# Patient Record
Sex: Male | Born: 1965 | Race: White | Hispanic: No | Marital: Married | Smoking: Never smoker
Health system: Southern US, Community
[De-identification: ages and names within clinical notes are randomized; demographics above are authoritative.]

## PROBLEM LIST (undated history)

## (undated) DIAGNOSIS — G4730 Sleep apnea, unspecified: Secondary | ICD-10-CM

## (undated) DIAGNOSIS — I219 Acute myocardial infarction, unspecified: Secondary | ICD-10-CM

## (undated) DIAGNOSIS — Z955 Presence of coronary angioplasty implant and graft: Secondary | ICD-10-CM

## (undated) DIAGNOSIS — J45909 Unspecified asthma, uncomplicated: Secondary | ICD-10-CM

## (undated) DIAGNOSIS — K219 Gastro-esophageal reflux disease without esophagitis: Secondary | ICD-10-CM

## (undated) DIAGNOSIS — R911 Solitary pulmonary nodule: Secondary | ICD-10-CM

## (undated) DIAGNOSIS — E785 Hyperlipidemia, unspecified: Secondary | ICD-10-CM

## (undated) DIAGNOSIS — R0600 Dyspnea, unspecified: Secondary | ICD-10-CM

## (undated) DIAGNOSIS — I2510 Atherosclerotic heart disease of native coronary artery without angina pectoris: Secondary | ICD-10-CM

## (undated) DIAGNOSIS — E109 Type 1 diabetes mellitus without complications: Secondary | ICD-10-CM

## (undated) DIAGNOSIS — I10 Essential (primary) hypertension: Secondary | ICD-10-CM

## (undated) DIAGNOSIS — E039 Hypothyroidism, unspecified: Secondary | ICD-10-CM

## (undated) DIAGNOSIS — Z8489 Family history of other specified conditions: Secondary | ICD-10-CM

## (undated) HISTORY — PX: POLYPECTOMY: SHX149

## (undated) HISTORY — PX: TONSILLECTOMY AND ADENOIDECTOMY: SUR1326

## (undated) HISTORY — DX: Sleep apnea, unspecified: G47.30

## (undated) HISTORY — PX: CHOLECYSTECTOMY: SHX55

## (undated) HISTORY — PX: COLONOSCOPY: SHX174

## (undated) HISTORY — DX: Unspecified asthma, uncomplicated: J45.909

## (undated) HISTORY — DX: Presence of coronary angioplasty implant and graft: Z95.5

## (undated) HISTORY — DX: Gastro-esophageal reflux disease without esophagitis: K21.9

## (undated) HISTORY — PX: KNEE ARTHROSCOPY: SHX127

## (undated) HISTORY — PX: APPENDECTOMY: SHX54

---

## 1998-10-01 ENCOUNTER — Other Ambulatory Visit: Admission: RE | Admit: 1998-10-01 | Discharge: 1998-10-01 | Payer: Self-pay | Admitting: *Deleted

## 2003-05-26 ENCOUNTER — Encounter: Payer: Self-pay | Admitting: Orthopedic Surgery

## 2003-05-26 ENCOUNTER — Ambulatory Visit (HOSPITAL_COMMUNITY): Admission: RE | Admit: 2003-05-26 | Discharge: 2003-05-26 | Payer: Self-pay | Admitting: Orthopedic Surgery

## 2011-06-09 ENCOUNTER — Encounter (INDEPENDENT_AMBULATORY_CARE_PROVIDER_SITE_OTHER): Payer: Self-pay | Admitting: Ophthalmology

## 2011-06-12 ENCOUNTER — Encounter (INDEPENDENT_AMBULATORY_CARE_PROVIDER_SITE_OTHER): Payer: PRIVATE HEALTH INSURANCE | Admitting: Ophthalmology

## 2011-06-12 DIAGNOSIS — H4310 Vitreous hemorrhage, unspecified eye: Secondary | ICD-10-CM

## 2011-06-12 DIAGNOSIS — E11359 Type 2 diabetes mellitus with proliferative diabetic retinopathy without macular edema: Secondary | ICD-10-CM

## 2011-06-12 DIAGNOSIS — H43819 Vitreous degeneration, unspecified eye: Secondary | ICD-10-CM

## 2011-06-12 DIAGNOSIS — H2510 Age-related nuclear cataract, unspecified eye: Secondary | ICD-10-CM

## 2011-07-04 ENCOUNTER — Ambulatory Visit (INDEPENDENT_AMBULATORY_CARE_PROVIDER_SITE_OTHER): Payer: Self-pay | Admitting: Ophthalmology

## 2011-10-24 DIAGNOSIS — R911 Solitary pulmonary nodule: Secondary | ICD-10-CM

## 2011-10-24 DIAGNOSIS — I219 Acute myocardial infarction, unspecified: Secondary | ICD-10-CM

## 2011-10-24 HISTORY — DX: Solitary pulmonary nodule: R91.1

## 2011-10-24 HISTORY — DX: Acute myocardial infarction, unspecified: I21.9

## 2011-12-13 ENCOUNTER — Ambulatory Visit (INDEPENDENT_AMBULATORY_CARE_PROVIDER_SITE_OTHER): Payer: BC Managed Care – PPO | Admitting: Ophthalmology

## 2011-12-13 DIAGNOSIS — E1039 Type 1 diabetes mellitus with other diabetic ophthalmic complication: Secondary | ICD-10-CM

## 2011-12-13 DIAGNOSIS — H4310 Vitreous hemorrhage, unspecified eye: Secondary | ICD-10-CM

## 2011-12-13 DIAGNOSIS — H2510 Age-related nuclear cataract, unspecified eye: Secondary | ICD-10-CM

## 2011-12-13 DIAGNOSIS — H43819 Vitreous degeneration, unspecified eye: Secondary | ICD-10-CM

## 2011-12-13 DIAGNOSIS — E11359 Type 2 diabetes mellitus with proliferative diabetic retinopathy without macular edema: Secondary | ICD-10-CM

## 2012-03-28 ENCOUNTER — Encounter (INDEPENDENT_AMBULATORY_CARE_PROVIDER_SITE_OTHER): Payer: BC Managed Care – PPO | Admitting: Ophthalmology

## 2012-03-28 DIAGNOSIS — H43819 Vitreous degeneration, unspecified eye: Secondary | ICD-10-CM

## 2012-03-28 DIAGNOSIS — E11359 Type 2 diabetes mellitus with proliferative diabetic retinopathy without macular edema: Secondary | ICD-10-CM

## 2012-03-28 DIAGNOSIS — H4310 Vitreous hemorrhage, unspecified eye: Secondary | ICD-10-CM | POA: Insufficient documentation

## 2012-03-28 DIAGNOSIS — E1039 Type 1 diabetes mellitus with other diabetic ophthalmic complication: Secondary | ICD-10-CM

## 2012-03-28 DIAGNOSIS — H2510 Age-related nuclear cataract, unspecified eye: Secondary | ICD-10-CM

## 2012-03-28 DIAGNOSIS — H3340 Traction detachment of retina, unspecified eye: Secondary | ICD-10-CM

## 2012-03-28 DIAGNOSIS — E113599 Type 2 diabetes mellitus with proliferative diabetic retinopathy without macular edema, unspecified eye: Secondary | ICD-10-CM | POA: Insufficient documentation

## 2012-03-28 NOTE — H&P (Signed)
[name] is an 46 y.o. male.   Chief Complaint:floaters and shadows in vision HPI: Longstanding diabetic with vitreous hemorrhage and traction right eye  No past medical history on file.  No past surgical history on file.  No family history on file. Social History:  does not have a smoking history on file. He does not have any smokeless tobacco history on file. His alcohol and drug histories not on file.  Allergies: Allergies not on file   (Not in a hospital admission)  Review of systems otherwise negative  There were no vitals taken for this visit.  Physical exam: Mental status: oriented x3. Eyes: See eye exam associated with this date of surgery in media tab.  Scanned in by scanning center Ears, Nose, Throat: within normal limits Neck: Within Normal limits General: within normal limits Chest: Within normal limits Breast: deferred Heart: Within normal limits Abdomen: Within normal limits GU: deferred Extremities: within normal limits Skin: within normal limits  Assessment/Plan Diabetic retinopathy with vitreous hemorrhage Plan: To [facility] for Pars plana vitrectomy, laser therapy and gas injection right eye  Sherrie George 03/28/2012, 5:11 PM

## 2012-04-29 MED ORDER — GATIFLOXACIN 0.5 % OP SOLN
1.0000 [drp] | OPHTHALMIC | Status: DC | PRN
  Filled 2012-04-29: qty 2.5

## 2012-04-29 MED ORDER — CYCLOPENTOLATE HCL 1 % OP SOLN
1.0000 [drp] | OPHTHALMIC | Status: DC | PRN
  Filled 2012-04-29: qty 2

## 2012-04-29 MED ORDER — PHENYLEPHRINE HCL 2.5 % OP SOLN
1.0000 [drp] | OPHTHALMIC | Status: DC | PRN
  Filled 2012-04-29: qty 3

## 2012-04-29 MED ORDER — CEFAZOLIN SODIUM 1-5 GM-% IV SOLN: 1.0000 g | INTRAVENOUS | Status: DC

## 2012-04-29 MED ORDER — TROPICAMIDE 1 % OP SOLN
1.0000 [drp] | OPHTHALMIC | Status: DC | PRN
  Filled 2012-04-29: qty 3

## 2012-04-30 ENCOUNTER — Ambulatory Visit (HOSPITAL_COMMUNITY): Payer: BC Managed Care – PPO

## 2012-04-30 ENCOUNTER — Encounter (HOSPITAL_COMMUNITY): Payer: Self-pay | Admitting: Vascular Surgery

## 2012-04-30 ENCOUNTER — Encounter (HOSPITAL_COMMUNITY)
Admission: RE | Disposition: A | Discharge status: Home / Self Care | Payer: Self-pay | Source: Ambulatory Visit | Attending: Ophthalmology

## 2012-04-30 ENCOUNTER — Encounter (HOSPITAL_COMMUNITY): Payer: Self-pay | Admitting: *Deleted

## 2012-04-30 ENCOUNTER — Ambulatory Visit (INDEPENDENT_AMBULATORY_CARE_PROVIDER_SITE_OTHER): Payer: BC Managed Care – PPO | Admitting: Ophthalmology

## 2012-04-30 ENCOUNTER — Ambulatory Visit (HOSPITAL_COMMUNITY)
Admission: RE | Admit: 2012-04-30 | Discharge: 2012-04-30 | Disposition: A | Discharge status: Home / Self Care | Payer: BC Managed Care – PPO | Source: Ambulatory Visit | Attending: Ophthalmology | Admitting: Ophthalmology

## 2012-04-30 DIAGNOSIS — Z01810 Encounter for preprocedural cardiovascular examination: Secondary | ICD-10-CM | POA: Insufficient documentation

## 2012-04-30 DIAGNOSIS — E11359 Type 2 diabetes mellitus with proliferative diabetic retinopathy without macular edema: Secondary | ICD-10-CM

## 2012-04-30 DIAGNOSIS — H4310 Vitreous hemorrhage, unspecified eye: Secondary | ICD-10-CM

## 2012-04-30 DIAGNOSIS — E1139 Type 2 diabetes mellitus with other diabetic ophthalmic complication: Secondary | ICD-10-CM | POA: Insufficient documentation

## 2012-04-30 DIAGNOSIS — E1039 Type 1 diabetes mellitus with other diabetic ophthalmic complication: Secondary | ICD-10-CM

## 2012-04-30 DIAGNOSIS — E1065 Type 1 diabetes mellitus with hyperglycemia: Secondary | ICD-10-CM

## 2012-04-30 DIAGNOSIS — H2510 Age-related nuclear cataract, unspecified eye: Secondary | ICD-10-CM

## 2012-04-30 DIAGNOSIS — Z01812 Encounter for preprocedural laboratory examination: Secondary | ICD-10-CM | POA: Insufficient documentation

## 2012-04-30 DIAGNOSIS — Z01818 Encounter for other preprocedural examination: Secondary | ICD-10-CM | POA: Insufficient documentation

## 2012-04-30 DIAGNOSIS — H43819 Vitreous degeneration, unspecified eye: Secondary | ICD-10-CM

## 2012-04-30 DIAGNOSIS — Z538 Procedure and treatment not carried out for other reasons: Secondary | ICD-10-CM | POA: Insufficient documentation

## 2012-04-30 HISTORY — DX: Essential (primary) hypertension: I10

## 2012-04-30 LAB — CBC
HCT: 42.7 % (ref 39.0–52.0)
Hemoglobin: 14.6 g/dL (ref 13.0–17.0)
RBC: 4.67 MIL/uL (ref 4.22–5.81)
WBC: 6.7 10*3/uL (ref 4.0–10.5)

## 2012-04-30 LAB — BASIC METABOLIC PANEL
Chloride: 103 mEq/L (ref 96–112)
GFR calc Af Amer: >90 mL/min (ref >90)
Potassium: 4 mEq/L (ref 3.5–5.1)

## 2012-04-30 LAB — SURGICAL PCR SCREEN
MRSA, PCR: POSITIVE — AB
Staphylococcus aureus: POSITIVE — AB

## 2012-04-30 SURGERY — PARS PLANA VITRECTOMY WITH 25 GAUGE
Anesthesia: General | Laterality: Right

## 2012-04-30 MED ORDER — MUPIROCIN 2 % EX OINT
TOPICAL_OINTMENT | CUTANEOUS | Status: AC
  Administered 2012-04-30: 1 via NASAL
  Filled 2012-04-30: qty 22

## 2012-04-30 MED ORDER — CEFAZOLIN SODIUM-DEXTROSE 2-3 GM-% IV SOLR: 2.0000 g | INTRAVENOUS | Status: DC

## 2012-04-30 MED ORDER — MUPIROCIN 2 % EX OINT
TOPICAL_OINTMENT | Freq: Once | CUTANEOUS | Status: AC
  Administered 2012-04-30: 1 via NASAL

## 2012-04-30 MED ORDER — CEFAZOLIN SODIUM-DEXTROSE 2-3 GM-% IV SOLR
INTRAVENOUS | Status: AC
  Filled 2012-04-30: qty 50

## 2012-04-30 SURGICAL SUPPLY — 60 items
APPLICATOR DR MATTHEWS STRL (MISCELLANEOUS) IMPLANT
BLADE EYE CATARACT 19 1.4 BEAV (BLADE) IMPLANT
BLADE MVR KNIFE 19G (BLADE) IMPLANT
BLADE MVR KNIFE 20G (BLADE) IMPLANT
CANNULA DUAL BORE 23G (CANNULA) IMPLANT
CANNULA FLEX TIP 25G (CANNULA) IMPLANT
CLOTH BEACON ORANGE TIMEOUT ST (SAFETY) ×2 IMPLANT
CORDS BIPOLAR (ELECTRODE) IMPLANT
COTTONBALL LRG STERILE PKG (GAUZE/BANDAGES/DRESSINGS) ×6 IMPLANT
DRAPE INCISE 51X51 W/FILM STRL (DRAPES) ×2 IMPLANT
DRAPE OPHTHALMIC 77X100 STRL (CUSTOM PROCEDURE TRAY) ×2 IMPLANT
FILTER BLUE MILLIPORE (MISCELLANEOUS) IMPLANT
FILTER STRAW FLUID ASPIR (MISCELLANEOUS) IMPLANT
FORCEPS ECKARDT ILM 25G SERR (OPHTHALMIC RELATED) IMPLANT
GLOVE SS BIOGEL STRL SZ 6.5 (GLOVE) ×1 IMPLANT
GLOVE SS BIOGEL STRL SZ 7 (GLOVE) ×1 IMPLANT
GLOVE SUPERSENSE BIOGEL SZ 6.5 (GLOVE) ×1
GLOVE SUPERSENSE BIOGEL SZ 7 (GLOVE) ×1
GLOVE SURG 8.5 LATEX PF (GLOVE) ×2 IMPLANT
GOWN STRL NON-REIN LRG LVL3 (GOWN DISPOSABLE) ×6 IMPLANT
ILLUMINATOR CHOW PICK 25GA (MISCELLANEOUS) ×2 IMPLANT
KIT BASIN OR (CUSTOM PROCEDURE TRAY) ×2 IMPLANT
KIT ROOM TURNOVER OR (KITS) ×2 IMPLANT
KNIFE CRESCENT 2.5 55 ANG (BLADE) IMPLANT
LENS BIOM SUPER VIEW SET DISP (OPHTHALMIC RELATED) IMPLANT
MARKER SKIN DUAL TIP RULER LAB (MISCELLANEOUS) IMPLANT
MICROPICK 25G (MISCELLANEOUS)
NEEDLE 18GX1X1/2 (RX/OR ONLY) (NEEDLE) ×2 IMPLANT
NEEDLE 25GX 5/8IN NON SAFETY (NEEDLE) ×2 IMPLANT
NEEDLE 27GAX1X1/2 (NEEDLE) IMPLANT
NEEDLE FILTER BLUNT 18X 1/2SAF (NEEDLE) ×1
NEEDLE FILTER BLUNT 18X1 1/2 (NEEDLE) ×1 IMPLANT
NEEDLE HYPO 30X.5 LL (NEEDLE) ×2 IMPLANT
NS IRRIG 1000ML POUR BTL (IV SOLUTION) ×2 IMPLANT
PACK VITRECTOMY CUSTOM (CUSTOM PROCEDURE TRAY) ×2 IMPLANT
PAD ARMBOARD 7.5X6 YLW CONV (MISCELLANEOUS) ×4 IMPLANT
PAK VITRECTOMY PIK 25 GA (OPHTHALMIC RELATED) ×2 IMPLANT
PENCIL BIPOLAR 25GA STR DISP (OPHTHALMIC RELATED) IMPLANT
PICK MICROPICK 25G (MISCELLANEOUS) IMPLANT
PROBE DIRECTIONAL LASER (MISCELLANEOUS) IMPLANT
REPL STRA BRUSH NEEDLE (NEEDLE) IMPLANT
RESERVOIR BACK FLUSH (MISCELLANEOUS) IMPLANT
ROLLS DENTAL (MISCELLANEOUS) ×4 IMPLANT
SCRAPER DIAMOND DUST MEMBRANE (MISCELLANEOUS) IMPLANT
SPONGE SURGIFOAM ABS GEL 12-7 (HEMOSTASIS) ×2 IMPLANT
STOPCOCK 4 WAY LG BORE MALE ST (IV SETS) IMPLANT
SUT CHROMIC 7 0 TG140 8 (SUTURE) IMPLANT
SUT ETHILON 10 0 CS140 6 (SUTURE) IMPLANT
SUT ETHILON 9 0 TG140 8 (SUTURE) IMPLANT
SUT POLY NON ABSORB 10-0 8 STR (SUTURE) IMPLANT
SUT SILK 4 0 RB 1 (SUTURE) IMPLANT
SYR 20CC LL (SYRINGE) ×2 IMPLANT
SYR 5ML LL (SYRINGE) IMPLANT
SYR BULB 3OZ (MISCELLANEOUS) ×2 IMPLANT
SYR TB 1ML LUER SLIP (SYRINGE) ×2 IMPLANT
SYRINGE 10CC LL (SYRINGE) IMPLANT
TOWEL OR 17X24 6PK STRL BLUE (TOWEL DISPOSABLE) ×6 IMPLANT
TROCAR CANNULA 25GA (CANNULA) IMPLANT
WATER STERILE IRR 1000ML POUR (IV SOLUTION) ×2 IMPLANT
WIPE INSTRUMENT VISIWIPE 73X73 (MISCELLANEOUS) ×2 IMPLANT

## 2012-04-30 NOTE — Progress Notes (Signed)
This encounter was created in error - please disregard.

## 2012-04-30 NOTE — Consult Note (Signed)
Anesthesia Consult:  Patient is a 46 year old male scheduled for a right eye pars plana vitrectomy today.  He was a same day work-up.  History includes DM1 (dx age 50) on Lantus/Humalog and pramlintide, HTN on losartan, non-smoker, hypothyroidism on levothyroxine, HLD on atorvastatin, prior appendectomy and cholecystectomy.  BMI 28.37.  Family history includes a brother who died of presumed MI @ age 1.  EKG on 04/30/12 showed NSR, anterolateral T wave abnormality, consider ischemia.  Currently there are no comparison EKGs.  He denies prior cardiac testing.    Patient denies chest pain, SOB, edema.  He lifts weights about 2X/week but otherwise no regular aerobic activity.  He is able to do yard work and walk up a flight of stairs without difficulty.  Exam shows his heart with a RRR, lung clear.  CXR on 04/30/12 showed no acute cardiopulmonary process.  Labs noted.  His last PCP recently retired.  He is planning to see Dr. Arlan Organ in [place].  His Endocrinologist is Dr. Darol Destine.  Due to finding of abnormal EKG with multiple risk factors patient will need a Cardiology pre-operative evaluation.  He prefers in [place].  Dr. Antoine Poche at [facility] can see him on 05/02/12 @ 1115.  Patient given information.  Dr. Ashley Royalty updated.  Patient told to call Dr. Anastasio Auerbach office when cleared.  Anesthesiologist Dr. Ivin Booty agrees with plan.  Shonna Chock, PA-C

## 2012-04-30 NOTE — H&P (Signed)
I examined the patient today and there is no change in the medical status 

## 2012-04-30 NOTE — Progress Notes (Addendum)
Patient's swab was positive for Staph and MRSA, called and they were still in [place] so they are coming by to get ointment that was used in [facility] this a.m. Prior to surgery being cancelled.   Will put note on front of chart that was broken down and is in [place] office that patient was positive for both and if he comes back in for surgery will need contact order and sign and will need to update Infection tab in preop list.

## 2012-05-02 ENCOUNTER — Encounter (HOSPITAL_COMMUNITY): Payer: Self-pay | Admitting: *Deleted

## 2012-05-02 ENCOUNTER — Ambulatory Visit (INDEPENDENT_AMBULATORY_CARE_PROVIDER_SITE_OTHER): Payer: BC Managed Care – PPO | Admitting: Cardiology

## 2012-05-02 ENCOUNTER — Encounter: Payer: Self-pay | Admitting: Cardiology

## 2012-05-02 ENCOUNTER — Other Ambulatory Visit (HOSPITAL_COMMUNITY): Payer: BC Managed Care – PPO

## 2012-05-02 ENCOUNTER — Ambulatory Visit (HOSPITAL_COMMUNITY): Payer: BC Managed Care – PPO

## 2012-05-02 ENCOUNTER — Ambulatory Visit (INDEPENDENT_AMBULATORY_CARE_PROVIDER_SITE_OTHER)
Admission: RE | Admit: 2012-05-02 | Discharge: 2012-05-02 | Disposition: A | Discharge status: Home / Self Care | Payer: BC Managed Care – PPO | Source: Ambulatory Visit | Attending: Cardiology | Admitting: Cardiology

## 2012-05-02 ENCOUNTER — Encounter: Payer: BC Managed Care – PPO | Admitting: Cardiology

## 2012-05-02 ENCOUNTER — Inpatient Hospital Stay (HOSPITAL_COMMUNITY)
Admission: AD | Admit: 2012-05-02 | Discharge: 2012-05-04 | DRG: 853 | Disposition: A | Discharge status: Home / Self Care | Payer: BC Managed Care – PPO | Source: Ambulatory Visit | Attending: Cardiology | Admitting: Cardiology

## 2012-05-02 ENCOUNTER — Ambulatory Visit (HOSPITAL_BASED_OUTPATIENT_CLINIC_OR_DEPARTMENT_OTHER): Payer: BC Managed Care – PPO | Admitting: Radiology

## 2012-05-02 VITALS — BP 129/85 | HR 67 | Ht 73.0 in | Wt 221.8 lb

## 2012-05-02 DIAGNOSIS — I214 Non-ST elevation (NSTEMI) myocardial infarction: Secondary | ICD-10-CM | POA: Diagnosis present

## 2012-05-02 DIAGNOSIS — I9589 Other hypotension: Secondary | ICD-10-CM | POA: Diagnosis not present

## 2012-05-02 DIAGNOSIS — R9431 Abnormal electrocardiogram [ECG] [EKG]: Secondary | ICD-10-CM | POA: Insufficient documentation

## 2012-05-02 DIAGNOSIS — Z8249 Family history of ischemic heart disease and other diseases of the circulatory system: Secondary | ICD-10-CM

## 2012-05-02 DIAGNOSIS — I2510 Atherosclerotic heart disease of native coronary artery without angina pectoris: Secondary | ICD-10-CM | POA: Diagnosis present

## 2012-05-02 DIAGNOSIS — Z794 Long term (current) use of insulin: Secondary | ICD-10-CM

## 2012-05-02 DIAGNOSIS — I10 Essential (primary) hypertension: Secondary | ICD-10-CM | POA: Diagnosis present

## 2012-05-02 DIAGNOSIS — E119 Type 2 diabetes mellitus without complications: Secondary | ICD-10-CM

## 2012-05-02 DIAGNOSIS — IMO0001 Reserved for inherently not codable concepts without codable children: Secondary | ICD-10-CM | POA: Insufficient documentation

## 2012-05-02 DIAGNOSIS — Y840 Cardiac catheterization as the cause of abnormal reaction of the patient, or of later complication, without mention of misadventure at the time of the procedure: Secondary | ICD-10-CM | POA: Diagnosis not present

## 2012-05-02 DIAGNOSIS — E039 Hypothyroidism, unspecified: Secondary | ICD-10-CM | POA: Diagnosis present

## 2012-05-02 DIAGNOSIS — E785 Hyperlipidemia, unspecified: Secondary | ICD-10-CM | POA: Diagnosis present

## 2012-05-02 DIAGNOSIS — Z955 Presence of coronary angioplasty implant and graft: Secondary | ICD-10-CM

## 2012-05-02 DIAGNOSIS — R911 Solitary pulmonary nodule: Secondary | ICD-10-CM

## 2012-05-02 DIAGNOSIS — E663 Overweight: Secondary | ICD-10-CM | POA: Insufficient documentation

## 2012-05-02 DIAGNOSIS — R0989 Other specified symptoms and signs involving the circulatory and respiratory systems: Secondary | ICD-10-CM

## 2012-05-02 DIAGNOSIS — Z01810 Encounter for preprocedural cardiovascular examination: Secondary | ICD-10-CM

## 2012-05-02 HISTORY — DX: Hyperlipidemia, unspecified: E78.5

## 2012-05-02 HISTORY — DX: Atherosclerotic heart disease of native coronary artery without angina pectoris: I25.10

## 2012-05-02 HISTORY — DX: Solitary pulmonary nodule: R91.1

## 2012-05-02 HISTORY — DX: Acute myocardial infarction, unspecified: I21.9

## 2012-05-02 HISTORY — DX: Hypothyroidism, unspecified: E03.9

## 2012-05-02 HISTORY — DX: Family history of other specified conditions: Z84.89

## 2012-05-02 LAB — DIFFERENTIAL
Eosinophils Absolute: 0.2 10*3/uL (ref 0.0–0.7)
Eosinophils Relative: 3 % (ref 0–5)
Lymphs Abs: 2.4 10*3/uL (ref 0.7–4.0)
Monocytes Absolute: 0.8 10*3/uL (ref 0.1–1.0)
Monocytes Relative: 10 % (ref 3–12)

## 2012-05-02 LAB — COMPREHENSIVE METABOLIC PANEL
BUN: 17 mg/dL (ref 6–23)
CO2: 24 mEq/L (ref 19–32)
Calcium: 9.7 mg/dL (ref 8.4–10.5)
Chloride: 104 mEq/L (ref 96–112)
Creatinine, Ser: 0.96 mg/dL (ref 0.50–1.35)
GFR calc Af Amer: >90 mL/min (ref >90)
GFR calc non Af Amer: >90 mL/min (ref >90)
Glucose, Bld: 94 mg/dL (ref 70–99)
Total Bilirubin: 0.5 mg/dL (ref 0.3–1.2)

## 2012-05-02 LAB — CBC
HCT: 42.7 % (ref 39.0–52.0)
MCH: 31.8 pg (ref 26.0–34.0)
MCV: 91.2 fL (ref 78.0–100.0)
Platelets: 330 10*3/uL (ref 150–400)
RBC: 4.68 MIL/uL (ref 4.22–5.81)

## 2012-05-02 LAB — CARDIAC PANEL(CRET KIN+CKTOT+MB+TROPI): Troponin I: <0.3 ng/mL (ref <0.30)

## 2012-05-02 MED ORDER — CHLORHEXIDINE GLUCONATE CLOTH 2 % EX PADS
6.0000 | MEDICATED_PAD | Freq: Every day | CUTANEOUS | Status: DC
  Administered 2012-05-03 – 2012-05-04 (×2): 6 via TOPICAL

## 2012-05-02 MED ORDER — PRAMLINTIDE ACETATE 1500 MCG/1.5ML ~~LOC~~ SOPN: 45.0000 ug | PEN_INJECTOR | Freq: Two times a day (BID) | SUBCUTANEOUS | Status: DC

## 2012-05-02 MED ORDER — ASPIRIN 81 MG PO CHEW
324.0000 mg | CHEWABLE_TABLET | ORAL | Status: AC
  Administered 2012-05-02: 324 mg via ORAL
  Filled 2012-05-02: qty 4

## 2012-05-02 MED ORDER — ACETAMINOPHEN 325 MG PO TABS: 650.0000 mg | ORAL_TABLET | ORAL | Status: DC | PRN

## 2012-05-02 MED ORDER — ATORVASTATIN CALCIUM 40 MG PO TABS
40.0000 mg | ORAL_TABLET | Freq: Every day | ORAL | Status: DC
  Administered 2012-05-03: 40 mg via ORAL
  Filled 2012-05-02 (×2): qty 1

## 2012-05-02 MED ORDER — ALPRAZOLAM 0.25 MG PO TABS: 0.2500 mg | ORAL_TABLET | Freq: Two times a day (BID) | ORAL | Status: DC | PRN

## 2012-05-02 MED ORDER — MUPIROCIN 2 % EX OINT
1.0000 "application " | TOPICAL_OINTMENT | Freq: Two times a day (BID) | CUTANEOUS | Status: DC
  Administered 2012-05-02 – 2012-05-04 (×4): 1 via NASAL

## 2012-05-02 MED ORDER — SODIUM CHLORIDE 0.9 % IV SOLN
1.0000 mL/kg/h | INTRAVENOUS | Status: DC
  Administered 2012-05-03: 1.001 mL/kg/h via INTRAVENOUS

## 2012-05-02 MED ORDER — SODIUM CHLORIDE 0.9 % IJ SOLN
3.0000 mL | Freq: Two times a day (BID) | INTRAMUSCULAR | Status: DC
  Administered 2012-05-02: 3 mL via INTRAVENOUS
  Administered 2012-05-03: 6 mL via INTRAVENOUS

## 2012-05-02 MED ORDER — SODIUM CHLORIDE 0.9 % IV SOLN
250.0000 mL | INTRAVENOUS | Status: DC | PRN
  Administered 2012-05-02: 250 mL via INTRAVENOUS

## 2012-05-02 MED ORDER — ASPIRIN EC 81 MG PO TBEC
81.0000 mg | DELAYED_RELEASE_TABLET | Freq: Every day | ORAL | Status: DC
  Administered 2012-05-04: 81 mg via ORAL
  Filled 2012-05-02 (×2): qty 1

## 2012-05-02 MED ORDER — NITROGLYCERIN 0.4 MG SL SUBL: 0.4000 mg | SUBLINGUAL_TABLET | SUBLINGUAL | Status: DC | PRN

## 2012-05-02 MED ORDER — LOSARTAN POTASSIUM 50 MG PO TABS
50.0000 mg | ORAL_TABLET | Freq: Every day | ORAL | Status: DC
  Administered 2012-05-03 – 2012-05-04 (×2): 50 mg via ORAL
  Filled 2012-05-02 (×2): qty 1

## 2012-05-02 MED ORDER — ZOLPIDEM TARTRATE 5 MG PO TABS: 5.0000 mg | ORAL_TABLET | Freq: Every evening | ORAL | Status: DC | PRN

## 2012-05-02 MED ORDER — HEPARIN BOLUS VIA INFUSION
4000.0000 [IU] | Freq: Once | INTRAVENOUS | Status: AC
  Administered 2012-05-02: 4000 [IU] via INTRAVENOUS
  Filled 2012-05-02: qty 4000

## 2012-05-02 MED ORDER — ASPIRIN 81 MG PO CHEW
324.0000 mg | CHEWABLE_TABLET | ORAL | Status: AC
  Administered 2012-05-03: 324 mg via ORAL
  Filled 2012-05-02: qty 4

## 2012-05-02 MED ORDER — CHLORHEXIDINE GLUCONATE CLOTH 2 % EX PADS
6.0000 | MEDICATED_PAD | Freq: Every day | CUTANEOUS | Status: DC
Start: 2012-05-03 — End: 2012-05-02

## 2012-05-02 MED ORDER — ONDANSETRON HCL 4 MG/2ML IJ SOLN: 4.0000 mg | Freq: Four times a day (QID) | INTRAMUSCULAR | Status: DC | PRN

## 2012-05-02 MED ORDER — INSULIN GLARGINE 100 UNIT/ML ~~LOC~~ SOLN
45.0000 [IU] | Freq: Every day | SUBCUTANEOUS | Status: DC
  Administered 2012-05-03: 45 [IU] via SUBCUTANEOUS

## 2012-05-02 MED ORDER — ASPIRIN 300 MG RE SUPP
300.0000 mg | RECTAL | Status: AC
  Filled 2012-05-02: qty 1

## 2012-05-02 MED ORDER — HEPARIN (PORCINE) IN NACL 100-0.45 UNIT/ML-% IJ SOLN
1350.0000 [IU]/h | INTRAMUSCULAR | Status: DC
  Administered 2012-05-02: 1350 [IU]/h via INTRAVENOUS
  Filled 2012-05-02 (×3): qty 250

## 2012-05-02 MED ORDER — INSULIN ASPART 100 UNIT/ML ~~LOC~~ SOLN
0.0000 [IU] | Freq: Three times a day (TID) | SUBCUTANEOUS | Status: DC
  Administered 2012-05-03 (×2): 3 [IU] via SUBCUTANEOUS

## 2012-05-02 MED ORDER — MUPIROCIN 2 % EX OINT
1.0000 | TOPICAL_OINTMENT | Freq: Two times a day (BID) | CUTANEOUS | Status: DC
Start: 2012-05-02 — End: 2012-05-02
  Filled 2012-05-02: qty 22

## 2012-05-02 MED ORDER — SODIUM CHLORIDE 0.9 % IJ SOLN: 3.0000 mL | INTRAMUSCULAR | Status: DC | PRN

## 2012-05-02 MED ORDER — FENOFIBRATE 160 MG PO TABS
160.0000 mg | ORAL_TABLET | Freq: Every day | ORAL | Status: DC
  Administered 2012-05-03 – 2012-05-04 (×2): 160 mg via ORAL
  Filled 2012-05-02 (×2): qty 1

## 2012-05-02 MED ORDER — LEVOTHYROXINE SODIUM 50 MCG PO TABS
50.0000 ug | ORAL_TABLET | Freq: Every day | ORAL | Status: DC
  Administered 2012-05-03 – 2012-05-04 (×2): 50 ug via ORAL
  Filled 2012-05-02 (×3): qty 1

## 2012-05-02 MED ORDER — CARVEDILOL 3.125 MG PO TABS
3.1250 mg | ORAL_TABLET | Freq: Two times a day (BID) | ORAL | Status: DC
Start: 2012-05-02 — End: 2012-05-03
  Administered 2012-05-02: 3.125 mg via ORAL
  Filled 2012-05-02 (×4): qty 1

## 2012-05-02 NOTE — Progress Notes (Signed)
Echocardiogram performed.  

## 2012-05-02 NOTE — Assessment & Plan Note (Signed)
We discussed exercise

## 2012-05-02 NOTE — Progress Notes (Signed)
ANTICOAGULATION CONSULT NOTE - Initial Consult  Pharmacy Consult for Heparin Indication: chest pain/ACS  No Known Allergies  Patient Measurements: Height: 6\' 1"  (185.4 cm) Weight: 215 lb 13.3 oz (97.9 kg) IBW/kg (Calculated) : 79.9  Heparin Dosing Weight: 97.9 kg  Vital Signs: Temp: 98 F (36.7 C) (07/11 1800) Temp src: Oral (07/11 1800) BP: 129/85 mmHg (07/11 1132) Pulse Rate: 67  (07/11 1132)  Labs:  Basename 04/30/12 0902  HGB 14.6  HCT 42.7  PLT 346  APTT --  LABPROT --  INR --  HEPARINUNFRC --  CREATININE 0.91  CKTOTAL --  CKMB --  TROPONINI --    Estimated Creatinine Clearance: 123.6 ml/min (by C-G formula based on Cr of 0.91).   Medical History: Past Medical History  Diagnosis Date  . Hypertension   . Diabetes mellitus   . Hyperthyroidism     Medications:  Infusions:    . sodium chloride      Assessment: 54 YOM with diabetes admitted 05/02/12 for evaluation of abnormal EKG with plan for CATH to start IV heparin. Patient was not on anticoagulation prior to admission. CBC is wnl. CrCl>100. No bleeding reported.   Goal of Therapy:  Heparin level 0.3-0.7 units/ml Monitor platelets by anticoagulation protocol: Yes   Plan:  1. Heparin bolus of 4000 units x1, then drip at 1350 units/hr (13.5 ml/hr).  2. Heparin level 6 hours after rate initiated.  3. Daily heparin level and CBC.   Fayne Norrie 05/02/2012,7:09 PM

## 2012-05-02 NOTE — Progress Notes (Signed)
HPI The patient presents for evaluation of an abnormal EKG. This was noted when he was going to have eye surgery for diabetic retinopathy. He has had diabetes since age 46. He doesn't recall ever being told he had an abnormal EKG. He denies any cardiovascular symptoms. He exercises fairly frequently though not in the last couple of weeks. He pedals a stationary bicycle. With this he denies any chest discomfort, neck or arm discomfort. He has no palpitations, presyncope or syncope. He has no shortness of breath, PND or orthopnea. He's had no weight gain or edema. However, he was found to have T-wave inversions in anterolateral leads.  No Known Allergies  Current Outpatient Prescriptions  Medication Sig Dispense Refill  . atorvastatin (LIPITOR) 40 MG tablet Take 40 mg by mouth daily.      . Coenzyme Q-10 100 MG capsule Take 100 mg by mouth daily.      . fenofibrate 160 MG tablet Take 160 mg by mouth daily.      . insulin glargine (LANTUS) 100 UNIT/ML injection Inject 45 Units into the skin at bedtime.      . insulin lispro (HUMALOG) 100 UNIT/ML injection Inject into the skin 3 (three) times daily before meals. Sliding scale. 1 unit per 10 grams of carbs at each meal      . levothyroxine (SYNTHROID, LEVOTHROID) 50 MCG tablet Take 50 mcg by mouth daily.      Marland Kitchen losartan (COZAAR) 50 MG tablet Take 50 mg by mouth daily.      . Pramlintide Acetate (SYMLINPEN 60 [place]) Inject 45 mcg into the skin 2 (two) times daily before lunch and supper.        Past Medical History  Diagnosis Date  . Hypertension   . Diabetes mellitus   . Hyperthyroidism     Past Surgical History  Procedure Date  . Appendectomy   . Cholecystectomy     Family History  Problem Relation Age of Onset  . Retinitis pigmentosa Father   . Coronary artery disease Brother 38    Died  . Coronary artery disease Maternal Uncle 33    Died  . Coronary artery disease Maternal Grandmother 58    Died  . Aneurysm Maternal Grandfather  85    Abdominal  . Aneurysm Daughter 16    Cerebral    History   Social History  . Marital Status: Married    Spouse Name: N/A    Number of Children: 1  . Years of Education: N/A   Occupational History  . Furniture buisiness    Social History Main Topics  . Smoking status: Never Smoker   . Smokeless tobacco: Never Used  . Alcohol Use:   . Drug Use:   . Sexually Active:    Other Topics Concern  . Not on file   Social History Narrative   Lives with wife and 3 step children.    ROS:  As stated in the HPI and negative for all other systems.   PHYSICAL EXAM BP 129/85  Pulse 67  Ht 6\' 1"  (1.854 m)  Wt 221 lb 12.8 oz (100.608 kg)  BMI 29.26 kg/m2 GENERAL:  Well appearing HEENT:  Pupils equal round and reactive, fundi not visualized, oral mucosa unremarkable NECK:  No jugular venous distention, waveform within normal limits, carotid upstroke brisk and symmetric, no bruits, no thyromegaly LYMPHATICS:  No cervical, inguinal adenopathy LUNGS:  Clear to auscultation bilaterally BACK:  No CVA tenderness CHEST:  Unremarkable HEART:  PMI not  displaced or sustained,S1 and S2 within normal limits, no S3, no S4, no clicks, no rubs, no murmurs ABD:  Flat, positive bowel sounds normal in frequency in pitch, no bruits, no rebound, no guarding, no midline pulsatile mass, no hepatomegaly, no splenomegaly EXT:  2 plus pulses throughout, no edema, no cyanosis no clubbing SKIN:  No rashes no nodules NEURO:  Cranial nerves II through XII grossly intact, motor grossly intact throughout PSYCH:  Cognitively intact, oriented to person place and time   EKG:  Sinus rhythm, rate 71, axis within normal limits, lateral T-wave inversions consistent with possible ischemia. 04/30/12  ASSESSMENT AND PLAN

## 2012-05-02 NOTE — Assessment & Plan Note (Signed)
His blood pressure is controlled. He will continue with the medicines as listed.

## 2012-05-02 NOTE — Assessment & Plan Note (Signed)
He has this followed by his endocrinologist who also manages his lipids. His last hemoglobin A1c he reports was 7. I have suggested a goal LDL less than 70 and HDL greater than 50.

## 2012-05-02 NOTE — Assessment & Plan Note (Signed)
The patient has a significantly abnormal EKG. I'm going to order an echocardiogram to make sure he has no regional wall motion abnormalities. This will also allow me to assess for LVH. He is going to come back for a treadmill test.  This will exclude obstructive coronary disease. Finally I would like to order a coronary calcium score quantify any degree of calcification as is marker for plaque burden.

## 2012-05-02 NOTE — Progress Notes (Signed)
Pt placed on contact isolation per protocol.  Surgical and mrsa pcr positive.   Pt and family educated about contact isolation and verbalized understanding.  Infection control notified.  Isolation hanger placed on door.

## 2012-05-02 NOTE — H&P (Signed)
CARDIOLOGY ADMISSION NOTE  HPI The patient presents for evaluation of an abnormal EKG. This was noted when he was going to have eye surgery for diabetic retinopathy. He has had diabetes since age 46. He doesn't recall ever being told he had an abnormal EKG. He denies any cardiovascular symptoms. He exercises fairly frequently though not in the last couple of weeks. He pedals a stationary bicycle. With this he denies any chest discomfort, neck or arm discomfort. He has no palpitations, presyncope or syncope. He has no shortness of breath, PND or orthopnea. He's had no weight gain or edema. However, he was found to have T-wave inversions in anterolateral leads.  I sent him for a stress echocardiogram which we were able to get today. He was able to walk for over 8 minutes and never developed chest pain. However, in recovery he developed significant ST depression in the anterior and anterolateral leads. This was 3-4 mm. There was ST elevation in lead V1. This persisted for almost 20 minutes of recovery but eventually resolve.  Images have been reviewed. He did drop his ejection fraction and has multiple wall motion abnormalities suggestive of multivessel disease.  No Known Allergies    Current Outpatient Prescriptions   Medication  Sig  Dispense  Refill   .  atorvastatin (LIPITOR) 40 MG tablet  Take 40 mg by mouth daily.         .  Coenzyme Q-10 100 MG capsule  Take 100 mg by mouth daily.         .  fenofibrate 160 MG tablet  Take 160 mg by mouth daily.         .  insulin glargine (LANTUS) 100 UNIT/ML injection  Inject 45 Units into the skin at bedtime.         .  insulin lispro (HUMALOG) 100 UNIT/ML injection  Inject into the skin 3 (three) times daily before meals. Sliding scale. 1 unit per 10 grams of carbs at each meal         .  levothyroxine (SYNTHROID, LEVOTHROID) 50 MCG tablet  Take 50 mcg by mouth daily.         Marland Kitchen  losartan (COZAAR) 50 MG tablet  Take 50 mg by mouth daily.         .   Pramlintide Acetate (SYMLINPEN 60 [place])  Inject 45 mcg into the skin 2 (two) times daily before lunch and supper.           Past Medical History   .  Hypertension     .  Diabetes mellitus    (Since age 34) .  Hyperthyroidism        Past Surgical History   .  Appendectomy     .  Cholecystectomy      Family History   .  Retinitis pigmentosa  Father     .  Coronary artery disease  Brother  48       Died   .  Coronary artery disease  Maternal Uncle  93       Died   .  Coronary artery disease  Maternal Grandmother  48       Died   .  Aneurysm  Maternal Grandfather  85       Abdominal   .  Aneurysm  Daughter  61       Cerebral      Social History    Marital Status:  Married  Number of Children:  1 Occupational History        Furniture buisiness      Smoking status:  Never Smoker     Smokeless tobacco:  Never Used    Lives with wife and 3 step children.    ROS:  As stated in the HPI and negative for all other systems.  PHYSICAL EXAM BP 129/85  Pulse 67  Ht 6\' 1"  (1.854 m)  Wt 221 lb 12.8 oz (100.608 kg)  BMI 29.26 kg/m2 GENERAL:  Well appearing HEENT:  Pupils equal round and reactive, fundi not visualized, oral mucosa unremarkable NECK:  No jugular venous distention, waveform within normal limits, carotid upstroke brisk and symmetric, no bruits, no thyromegaly LYMPHATICS:  No cervical, inguinal adenopathy LUNGS:  Clear to auscultation bilaterally BACK:  No CVA tenderness CHEST:  Unremarkable HEART:  PMI not displaced or sustained,S1 and S2 within normal limits, no S3, no S4, no clicks, no rubs, no murmurs ABD:  Flat, positive bowel sounds normal in frequency in pitch, no bruits, no rebound, no guarding, no midline pulsatile mass, no hepatomegaly, no splenomegaly EXT:  2 plus pulses throughout, no edema, no cyanosis no clubbing SKIN:  No rashes no nodules NEURO:  Cranial nerves II through XII grossly intact, motor grossly intact throughout PSYCH:  Cognitively intact,  oriented to person place and time  EKG:  Sinus rhythm, rate 71, axis within normal limits, lateral T-wave inversions consistent with possible ischemia. 04/30/12   ASSESSMENT AND PLAN  Abnormal Stress Test:  The patient had a markedly abnormal stress test without symptoms. 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 He is being admitted to the ICU. He'll be treated with aspirin, heparin. He will have cardiac catheterization electively.  Diabetes:  We will continue his previous regimen with sliding scale insulin as needed.  Hyperlipidemia:  We will check a lipid profile with a goal LDL less than 70 and HDL greater than 40.

## 2012-05-02 NOTE — Patient Instructions (Addendum)
The current medical regimen is effective;  continue present plan and medications.  Your physician has requested that you have an exercise tolerance test. For further information please visit [email or web address] Please also follow instruction sheet, as given.  Your physician has requested that you have an echocardiogram. Echocardiography is a painless test that uses sound waves to create images of your heart. It provides your doctor with information about the size and shape of your heart and how well your heart's chambers and valves are working. This procedure takes approximately one hour. There are no restrictions for this procedure.  Your physician has requested that you have Coronary Calcium Score. Cardiac computed tomography (CT) is a painless test that uses an x-ray machine to take clear, detailed pictures of your heart. For further information please visit [email or web address] Please follow instruction sheet as given.  Follow up in 1 year with Dr Antoine Poche.  You will receive a letter in the mail 2 months before you are due.  Please call us when you receive this letter to schedule your follow up appointment.

## 2012-05-02 NOTE — Addendum Note (Signed)
Addended by: Sharin Grave on: 05/02/2012 02:18 PM   Modules accepted: Orders

## 2012-05-03 ENCOUNTER — Encounter (HOSPITAL_COMMUNITY)
Admission: AD | Disposition: A | Discharge status: Home / Self Care | Payer: Self-pay | Source: Ambulatory Visit | Attending: Cardiology

## 2012-05-03 ENCOUNTER — Other Ambulatory Visit: Payer: Self-pay

## 2012-05-03 ENCOUNTER — Encounter (HOSPITAL_COMMUNITY): Payer: Self-pay | Admitting: General Practice

## 2012-05-03 DIAGNOSIS — E785 Hyperlipidemia, unspecified: Secondary | ICD-10-CM | POA: Diagnosis present

## 2012-05-03 DIAGNOSIS — I214 Non-ST elevation (NSTEMI) myocardial infarction: Secondary | ICD-10-CM

## 2012-05-03 DIAGNOSIS — I2510 Atherosclerotic heart disease of native coronary artery without angina pectoris: Secondary | ICD-10-CM

## 2012-05-03 HISTORY — PX: LEFT HEART CATHETERIZATION WITH CORONARY ANGIOGRAM: SHX5451

## 2012-05-03 LAB — LIPID PANEL
HDL: 43 mg/dL (ref >39)
LDL Cholesterol: 90 mg/dL (ref 0–99)
Total CHOL/HDL Ratio: 3.4 RATIO
Triglycerides: 74 mg/dL (ref <150)

## 2012-05-03 LAB — TSH: TSH: 1.591 u[IU]/mL (ref 0.350–4.500)

## 2012-05-03 LAB — CBC
HCT: 43.9 % (ref 39.0–52.0)
RBC: 4.76 MIL/uL (ref 4.22–5.81)
RDW: 14.1 % (ref 11.5–15.5)
WBC: 7.5 10*3/uL (ref 4.0–10.5)

## 2012-05-03 LAB — CARDIAC PANEL(CRET KIN+CKTOT+MB+TROPI)
CK, MB: 4 ng/mL (ref 0.3–4.0)
CK, MB: 4.2 ng/mL — ABNORMAL HIGH (ref 0.3–4.0)
Total CK: 90 U/L (ref 7–232)
Troponin I: 0.44 ng/mL (ref <0.30)

## 2012-05-03 LAB — GLUCOSE, CAPILLARY
Glucose-Capillary: 158 mg/dL — ABNORMAL HIGH (ref 70–99)
Glucose-Capillary: 246 mg/dL — ABNORMAL HIGH (ref 70–99)

## 2012-05-03 LAB — HEPARIN LEVEL (UNFRACTIONATED): Heparin Unfractionated: 0.39 IU/mL (ref 0.30–0.70)

## 2012-05-03 LAB — POCT ACTIVATED CLOTTING TIME: Activated Clotting Time: 449 seconds

## 2012-05-03 SURGERY — LEFT HEART CATHETERIZATION WITH CORONARY ANGIOGRAM
Anesthesia: LOCAL

## 2012-05-03 MED ORDER — SODIUM CHLORIDE 0.9 % IV SOLN
1.7500 mg/kg/h | INTRAVENOUS | Status: DC
  Administered 2012-05-03: 1.75 mg/kg/h via INTRAVENOUS
  Filled 2012-05-03: qty 250

## 2012-05-03 MED ORDER — MIDAZOLAM HCL 2 MG/2ML IJ SOLN
INTRAMUSCULAR | Status: AC
  Filled 2012-05-03: qty 2

## 2012-05-03 MED ORDER — CARVEDILOL 6.25 MG PO TABS
6.2500 mg | ORAL_TABLET | Freq: Two times a day (BID) | ORAL | Status: DC
  Administered 2012-05-03 – 2012-05-04 (×3): 6.25 mg via ORAL
  Filled 2012-05-03 (×6): qty 1

## 2012-05-03 MED ORDER — VERAPAMIL HCL 2.5 MG/ML IV SOLN
INTRAVENOUS | Status: AC
  Filled 2012-05-03: qty 4

## 2012-05-03 MED ORDER — HEPARIN SODIUM (PORCINE) 1000 UNIT/ML IJ SOLN
INTRAMUSCULAR | Status: AC
  Filled 2012-05-03: qty 1

## 2012-05-03 MED ORDER — NITROGLYCERIN 0.2 MG/ML ON CALL CATH LAB
INTRAVENOUS | Status: AC
  Filled 2012-05-03: qty 1

## 2012-05-03 MED ORDER — SODIUM CHLORIDE 0.9 % IV SOLN
1.0000 mL/kg/h | INTRAVENOUS | Status: AC
  Administered 2012-05-03: 15:00:00 1 mL/kg/h via INTRAVENOUS

## 2012-05-03 MED ORDER — BIVALIRUDIN 250 MG IV SOLR
INTRAVENOUS | Status: AC
  Filled 2012-05-03: qty 250

## 2012-05-03 MED ORDER — LIDOCAINE HCL (PF) 1 % IJ SOLN
INTRAMUSCULAR | Status: AC
  Filled 2012-05-03: qty 30

## 2012-05-03 MED ORDER — PRASUGREL HCL 10 MG PO TABS
10.0000 mg | ORAL_TABLET | Freq: Every day | ORAL | Status: DC
  Administered 2012-05-04: 10 mg via ORAL
  Filled 2012-05-03 (×2): qty 1

## 2012-05-03 MED ORDER — PRASUGREL HCL 10 MG PO TABS
ORAL_TABLET | ORAL | Status: AC
  Administered 2012-05-04: 10 mg via ORAL
  Filled 2012-05-03: qty 6

## 2012-05-03 MED ORDER — HEPARIN (PORCINE) IN NACL 2-0.9 UNIT/ML-% IJ SOLN
INTRAMUSCULAR | Status: AC
Start: 2012-05-03 — End: 2012-05-03
  Filled 2012-05-03: qty 2000

## 2012-05-03 MED ORDER — FENTANYL CITRATE 0.05 MG/ML IJ SOLN
INTRAMUSCULAR | Status: AC
  Filled 2012-05-03: qty 2

## 2012-05-03 NOTE — H&P (View-Only) (Signed)
 TELEMETRY: Reviewed telemetry pt in NSR: Filed Vitals:   05/03/12 0001 05/03/12 0200 05/03/12 0400 05/03/12 0600  BP: 114/72 112/63 117/62 121/65  Pulse: 73 69 66 67  Temp:   98.5 F (36.9 C)   TempSrc:   Oral   Resp: 16 15 14 14  Height:      Weight:      SpO2: 99%  96% 96%    Intake/Output Summary (Last 24 hours) at 05/03/12 0757 Last data filed at 05/03/12 0600  Gross per 24 hour  Intake  992.5 ml  Output   2700 ml  Net -1707.5 ml    SUBJECTIVE No complaints. Still no chest pain.  LABS: Basic Metabolic Panel:  Basename 05/02/12 1930 04/30/12 0902  NA 140 138  K 4.0 4.0  CL 104 103  CO2 24 24  GLUCOSE 94 168*  BUN 17 18  CREATININE 0.96 0.91  CALCIUM 9.7 9.9  MG -- --  PHOS -- --   Liver Function Tests:  Basename 05/02/12 1930  AST 35  ALT 29  ALKPHOS 36*  BILITOT 0.5  PROT 7.4  ALBUMIN 4.3   No results found for this basename: LIPASE:2,AMYLASE:2 in the last 72 hours CBC:  Basename 05/03/12 0655 05/02/12 1930  WBC 7.5 7.9  NEUTROABS -- 4.5  HGB 14.9 14.9  HCT 43.9 42.7  MCV 92.2 91.2  PLT 321 330   Cardiac Enzymes:  Basename 05/03/12 0030 05/02/12 1855  CKTOTAL 90 101  CKMB 4.0 3.8  CKMBINDEX -- --  TROPONINI 0.44* <0.30   Fasting Lipid Panel: No results found for this basename: CHOL,HDL,LDLCALC,TRIG,CHOLHDL,LDLDIRECT in the last 72 hours Thyroid Function Tests:  Basename 05/02/12 1930  TSH 1.591  T4TOTAL --  T3FREE --  THYROIDAB --   Ecg: NSR with anterolateral TWA  Radiology/Studies:  Dg Chest 2 View  04/30/2012  *RADIOLOGY REPORT*  Clinical Data: Preop for eye surgery.  Diabetes.  Hypertension.  CHEST - 2 VIEW  Comparison: None.  Findings: Mildly low lung volumes on the frontal. Midline trachea. Normal heart size and mediastinal contours.  Mild right hemidiaphragm elevation. No pleural effusion or pneumothorax. Clear lungs.  IMPRESSION: No acute cardiopulmonary disease.  Original Report Authenticated By: KYLE D. TALBOT, M.D.     Ct Cardiac Scoring  05/02/2012  **ADDENDUM** CREATED: 05/02/2012 17:08:51  CARDIAC CTA WITH CALCIUM SCORE 05/02/2012 16:00:00  Ordering Physician: JAMESHOCHREIN  Reading Physician: DaltonS.Mclean  Protocol:  The patient scanned on a Siemens sensations 16 slice scanner.  Gantry rotation speed was 320 milliseconds.  Collimation was set at 0.6 mm . Reconstruction overlap was set at 0.4 mm. After an initial AP and lateral topogram, 3 mm axial slices were performed through the heart for calcium scoring.  Indications: CAD risk  DETAILED FINDINGS:  Quality of Study: Fair, there was artifact making evaluation of the distal RCA difficult.  Coronary Calcium Score: 408.5 Agatston units.  The LAD was heavily calcified.  IMPRESSION: Coronary artery calcium score of 408.5 Agatston units places the patient in the 97th percentile for his age and gender, suggesting high risk of future cardiac events.  A coronary artery calcium score > 400 Agatston units suggests elevated risk of obstructive CAD, stress testing could be considered.  **END ADDENDUM** SIGNED BY: Dalton S. Mclean   05/02/2012  OVER-READ INTERPRETATION - CT CHEST  The following report is an over-read performed by radiologist Dr. Kyle D. Talbot, M.D. of [facility] on 05/02/2012 16:30:40.  This over-read does not include interpretation of   cardiac or coronary anatomy or pathology.  The coronary calcium score interpretation by the cardiologist is attached.  Findings: Lung windows demonstrate 3 mm subpleural right middle lobe lung nodule on image 17 of series 4.  Mild nonspecific mosaic attenuation within the lung bases.  Soft tissue windows demonstrate no imaged thoracic adenopathy, given limitations of unenhanced CT.  Air fluid level within the thoracic esophagus on image 26 of series 3.  Normal thoracic aortic caliber.  Pericardial calcifications, including along the undersurface of the heart on image 53 of series 5. No pericardial or pleural effusion.   Limited abdominal imaging demonstrates no significant findings. No acute osseous abnormality.  IMPRESSION  1.  Extracardiac findings pertinent for a 3 mm right middle lobe lung nodule.  Favor a subpleural lymph node.  If the patient is at high risk for bronchogenic carcinoma, follow-up chest CT at 1 year is recommended.  If the patient is at low risk, no follow-up is needed.   This recommendation follows the consensus statement: "Guidelines for Management of Small Pulmonary Nodules Detected on CT Scans:  A Statement from the Fleischner Society" as published in Radiology 2005; 237:395-400.  Available online at: http://www.med.[employer].edu/rad/res/Fleischner-nodule.htm. 2.  Pericardial calcifications, suggesting prior pericardial effusion. 3. Esophageal air fluid level suggests dysmotility or gastroesophageal reflux.  Original Report Authenticated By: [email or web address]    PHYSICAL EXAM General: Well developed, well nourished, in no acute distress. Head: Normocephalic, atraumatic, sclera non-icteric, no xanthomas, nares are without discharge. Neck: Negative for carotid bruits. JVD not elevated. Lungs: Clear bilaterally to auscultation without wheezes, rales, or rhonchi. Breathing is unlabored. Heart: RRR S1 S2 without murmurs, rubs, or gallops.  Abdomen: Soft, non-tender, non-distended with normoactive bowel sounds. No hepatomegaly. No rebound/guarding. No obvious abdominal masses. Msk:  Strength and tone appears normal for age. Extremities: No clubbing, cyanosis or edema.  Distal pedal pulses are 2+ and equal bilaterally. Neuro: Alert and oriented X 3. Moves all extremities spontaneously. Psych:  Responds to questions appropriately with a normal affect.  ASSESSMENT AND PLAN: 1. NSTEMI post markedly abnormal stress Echo yesterday. Patient completely pain free. On IV heparin. For cardiac cath +/- PCI today. Procedure and risks explained in detail to patient and he is agreeable to proceed.  2. IDDM  3. HTN  4.  Hyperlipidemia. On Lipitor and fenofibrate. Lipid panel pending.   5. Hypothyroidism- thyroid levels good.  6. Strong family history of premature CAD  Principal Problem:  *NSTEMI (non-ST elevated myocardial infarction) Active Problems:  Hypertension  IDDM (insulin dependent diabetes mellitus)  Hyperlipidemia    Signed, Briyah Wheelwright MD,FACC 05/03/2012 7:57 AM    

## 2012-05-03 NOTE — Interval H&P Note (Signed)
History and Physical Interval Note:  05/03/2012 10:59 AM  [name]  has presented today for surgery, with the diagnosis of Chest pain  The various methods of treatment have been discussed with the patient and family. After consideration of risks, benefits and other options for treatment, the patient has consented to  Procedure(s) (LRB): LEFT HEART CATHETERIZATION WITH CORONARY ANGIOGRAM (N/A) as a surgical intervention .  The patient's history has been reviewed, patient examined, no change in status, stable for surgery.  I have reviewed the patients' chart and labs.  Questions were answered to the patient's satisfaction.     [name] [facility] 05/03/2012 10:59 AM

## 2012-05-03 NOTE — Care Management Note (Signed)
    Page 1 of 1   05/03/2012     8:55:13 AM   CARE MANAGEMENT NOTE 05/03/2012  Patient:  [name], [name]   Account Number:  [id number]  Date Initiated:  05/03/2012  Documentation initiated by:  Junius Creamer  Subjective/Objective Assessment:   adm w mi     Action/Plan:   lives w wife, pcp dr Maisie Fus [place]   Anticipated DC Date:     Anticipated DC Plan:        DC Planning Services  CM consult      Choice offered to / List presented to:             Status of service:   Medicare Important Message given?   (If response is "NO", the following Medicare IM given date fields will be blank) Date Medicare IM given:   Date Additional Medicare IM given:    Discharge Disposition:    Per UR Regulation:  Reviewed for med. necessity/level of care/duration of stay  If discussed at Long Length of Stay Meetings, dates discussed:    Comments:  7/12 9:00 debbie Asiya Cutbirth rn,bsn [phone]

## 2012-05-03 NOTE — Plan of Care (Signed)
Problem: Consults Goal: Tobacco Cessation referral if indicated Outcome: Not Applicable Date Met:  05/03/12 Patient reports being a non-smoker. Did verbalize the [place] hotline for smoking cessation.

## 2012-05-03 NOTE — Progress Notes (Signed)
TR BAND REMOVAL  LOCATION:  right radial  DEFLATED PER PROTOCOL:  yes  TIME BAND OFF / DRESSING APPLIED:   1645   SITE UPON ARRIVAL:   Level 0  SITE AFTER BAND REMOVAL:  Level 0  REVERSE ALLEN'S TEST:    positive  CIRCULATION SENSATION AND MOVEMENT:  Within Normal Limits  yes  COMMENTS:    

## 2012-05-03 NOTE — Progress Notes (Signed)
ANTICOAGULATION CONSULT NOTE - Follow-Up Consult  Pharmacy Consult for Heparin Indication: chest pain/ACS  No Known Allergies  Patient Measurements: Height: 6\' 1"  (185.4 cm) Weight: 215 lb 13.3 oz (97.9 kg) IBW/kg (Calculated) : 79.9   Vital Signs: Temp: 97.8 F (36.6 C) (07/11 2340) Temp src: Oral (07/11 2340) BP: 114/72 mmHg (07/12 0001) Pulse Rate: 81  (07/11 2100)  Labs:  Basename 05/03/12 0051 05/03/12 0030 05/02/12 1930 05/02/12 1855 04/30/12 0902  HGB -- -- 14.9 -- 14.6  HCT -- -- 42.7 -- 42.7  PLT -- -- 330 -- 346  APTT -- -- -- -- --  LABPROT -- -- -- -- --  INR -- -- -- -- --  HEPARINUNFRC 0.39 -- -- -- --  CREATININE -- -- 0.96 -- 0.91  CKTOTAL -- 90 -- 101 --  CKMB -- 4.0 -- 3.8 --  TROPONINI -- 0.44* -- <0.30 --    Estimated Creatinine Clearance: 117.2 ml/min (by C-G formula based on Cr of 0.96).   Assessment: 42 YOM with diabetes admitted 05/02/12 for evaluation of abnormal stress test with plan for CATH. Heparin level 0.39 - therapeutic on gtt at 1350 units/hr. CBC is wnl. CrCl>100. No bleeding reported.   Goal of Therapy:  Heparin level 0.3-0.7 units/ml Monitor platelets by anticoagulation protocol: Yes   Plan:  1. Continue heparin at 1350 units/hr (13.5 ml/hr).  2. F/u post cath today  Christoper Fabian, PharmD, BCPS Clinical pharmacist, pager [phone] 05/03/2012,1:57 AM

## 2012-05-03 NOTE — CV Procedure (Signed)
Cardiac Catheterization Procedure Note  Name: [name] MRN: [id number] DOB: 1965/12/30  Procedure:  Selective Coronary Angiography,  PTCA and stenting of the proximal and mid LAD and the first obtuse marginal branch of the left circumflex.  Indication: 46 year old white male with history of insulin-dependent diabetes mellitus, hypertension, hyperlipidemia, and strong family history of coronary disease. Recent ECG was abnormal. Patient has been asymptomatic. A stress echo was markedly positive for ischemia. Patient was admitted after his stress test and had positive troponins.  Procedural Details:  The right wrist was prepped, draped, and anesthetized with 1% lidocaine. Using the modified Seldinger technique, a 5 French sheath was introduced into the right radial artery. 3 mg of verapamil was administered through the sheath, weight-based unfractionated heparin was administered intravenously. Standard Judkins catheters were used for selective coronary angiography. Left ventricular angiography was not performed to conserve dye load. Catheter exchanges were performed over an exchange length guidewire. Of note it was difficult to cannulate the left coronary ostium. We were able to perform his diagnostic study with a 5 [place] ERAD catheter. During his PCI we achieve much better engagement with an XB LAD 3.0 guide.  PROCEDURAL FINDINGS Hemodynamics: AO 133/73 mean 100 mmHg    Coronary angiography: Coronary dominance: right  Left mainstem: Mild irregularities less than 10%.  Left anterior descending (LAD): There is a segmental 90% stenosis in the proximal LAD. In the mid LAD there is a long 95% stenosis in a tortuous segment.  Left circumflex (LCx): The left circumflex gives rise to a very large first marginal branch and continues in the AV groove giving off several posterior lateral branches. The first marginal branch has tandem lesions up to 90% in the proximal vessel and 80-90% in the mid  vessel. The remainder of the circumflex is without significant disease.  Right coronary artery (RCA): The right coronary is a dominant vessel. It has diffuse 30% disease proximally. In the mid vessel there is a 40-50% stenosis.  Left ventriculography: Not performed   PCI Note:  Following the diagnostic procedure, the decision was made to proceed with PCI of both the LAD and obtuse marginal lesions. We initially attempted intervention with the 5 [place] guide but this yielded poor support. The radial sheath was upsized to a 6 [place]. Weight-based bivalirudin was given for anticoagulation. The patient was loaded with 60 mg oral Effient. Once a therapeutic ACT was achieved, a 6 [place] XB LAD 3.0 guide catheter was inserted. Of note there was much more resistance in the radial artery to passing a 6 French catheter. We were able to proceed but the catheter fit was quite snug. A PT moderate support coronary guidewire was used to cross the lesions in the LAD.  The lesion were predilated with a 2.5 mm compliant balloon.  The lesion in the mid LAD was then stented with a 2.75 x 28 mm Xience Xpedition stent.  The stent was postdilated with a 3.0 mm noncompliant balloon to 12 atmospheres. We then stented the proximal LAD lesion with a 3.25 x 18 mm Xience Xpedition stent. The stent was postdilated with a 3.5 mm noncompliant balloon to 12 atmospheres Following PCI, there was 0% residual stenosis in both lesions and TIMI-3 flow. Final angiography confirmed an excellent result.   We next performed PCI of the obtuse marginal vessel. The PT wire was redirected down this vessel. We predilated the tandem lesions with a 2.5 mm compliant balloon. We attempted to stent the vessel at this point bar were  unable to cross with the stent. We then predilated with a 3.0 mm  Noncompliant balloon to 12 atmospheres. Even at this point we were still unable to pass the stent. We attempted using a buddy wire without success. We attempted to  use a guideliner but were unable to advance this into the circumflex vessel. It gave no additional support. While we could pass the stent to the proximal lesion we were unable to pass it through the mid vessel lesion. We then predilated this lesion with a 3.5 mm noncompliant balloon to 8 atmospheres. We gave the patient intracoronary nitroglycerin and at this point we were finally able to pass a stent and the tandem lesions were stented with a single stent. This was a 3.0 x 33 mm Xience Xpedition stent. At this point the patient did become diaphoretic and significantly hypotensive. This responded to IV volume. The stent was then postdilated with a 3.5 mm noncompliant balloon to 12 atmospheres. Following PCI, there was 0% residual stenosis and TIMI grade 3 flow. Final angiography confirmed an excellent result. The patient tolerated the procedure well. There were no immediate procedural complications. A TR band was used for radial hemostasis. The patient was transferred to the post catheterization recovery area for further monitoring.  PCI Data: Vessel - LAD/Segment - proximal and mid Percent Stenosis (pre)  90% and 95% respectively TIMI-flow 3 Stent 3.25 x 18 mm his Xience Xpedition and 2.75 x 28 mm Xience Xpedition respectively Percent Stenosis (post) 0% and 0% TIMI-flow (post) 3  Vessel #2-first obtuse marginal vessel-proximal and mid Percent stenosis (pre-) 90% TIMI flow 3 Stent 3.0 x 33 mm Xience Xpedition Percent stenosis (post) 0% TIMI flow (post) 3  Final Conclusions:   1. 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 2. Successful multivessel stenting including the proximal and mid LAD, proximal to mid obtuse marginal vessel with drug-eluting stents.   Recommendations:  Dual antiplatelet therapy for at least one year. Aggressive risk factor modification.  [name] [facility] 05/03/2012, 3:30 PM

## 2012-05-03 NOTE — Progress Notes (Signed)
TELEMETRY: Reviewed telemetry pt in NSR: Filed Vitals:   05/03/12 0001 05/03/12 0200 05/03/12 0400 05/03/12 0600  BP: 114/72 112/63 117/62 121/65  Pulse: 73 69 66 67  Temp:   98.5 F (36.9 C)   TempSrc:   Oral   Resp: 16 15 14 14   Height:      Weight:      SpO2: 99%  96% 96%    Intake/Output Summary (Last 24 hours) at 05/03/12 0757 Last data filed at 05/03/12 0600  Gross per 24 hour  Intake  992.5 ml  Output   2700 ml  Net -1707.5 ml    SUBJECTIVE No complaints. Still no chest pain.  LABS: Basic Metabolic Panel:  Basename 05/02/12 1930 04/30/12 0902  NA 140 138  K 4.0 4.0  CL 104 103  CO2 24 24  GLUCOSE 94 168*  BUN 17 18  CREATININE 0.96 0.91  CALCIUM 9.7 9.9  MG -- --  PHOS -- --   Liver Function Tests:  [facility] 05/02/12 1930  AST 35  ALT 29  ALKPHOS 36*  BILITOT 0.5  PROT 7.4  ALBUMIN 4.3   No results found for this basename: LIPASE:2,AMYLASE:2 in the last 72 hours CBC:  Basename 05/03/12 0655 05/02/12 1930  WBC 7.5 7.9  NEUTROABS -- 4.5  HGB 14.9 14.9  HCT 43.9 42.7  MCV 92.2 91.2  PLT 321 330   Cardiac Enzymes:  Basename 05/03/12 0030 05/02/12 1855  CKTOTAL 90 101  CKMB 4.0 3.8  CKMBINDEX -- --  TROPONINI 0.44* <0.30   Fasting Lipid Panel: No results found for this basename: CHOL,HDL,LDLCALC,TRIG,CHOLHDL,LDLDIRECT in the last 72 hours Thyroid Function Tests:  Basename 05/02/12 1930  TSH 1.591  T4TOTAL --  T3FREE --  THYROIDAB --   Ecg: NSR with anterolateral TWA  Radiology/Studies:  Dg Chest 2 View  04/30/2012  *RADIOLOGY REPORT*  Clinical Data: Preop for eye surgery.  Diabetes.  Hypertension.  CHEST - 2 VIEW  Comparison: None.  Findings: Mildly low lung volumes on the frontal. Midline trachea. Normal heart size and mediastinal contours.  Mild right hemidiaphragm elevation. No pleural effusion or pneumothorax. Clear lungs.  IMPRESSION: No acute cardiopulmonary disease.  Original Report Authenticated By: Consuello Bossier, M.D.     Ct Cardiac Scoring  05/02/2012  **ADDENDUM** CREATED: 05/02/2012 17:08:51  CARDIAC CTA WITH CALCIUM SCORE 05/02/2012 16:00:00  Ordering Physician: JAMESHOCHREIN  Reading Physician: DaltonS.Mclean  Protocol:  The patient scanned on a Siemens sensations 16 slice scanner.  Gantry rotation speed was 320 milliseconds.  Collimation was set at 0.6 mm . Reconstruction overlap was set at 0.4 mm. After an initial AP and lateral topogram, 3 mm axial slices were performed through the heart for calcium scoring.  Indications: CAD risk  DETAILED FINDINGS:  Quality of Study: Fair, there was artifact making evaluation of the distal RCA difficult.  Coronary Calcium Score: 408.5 Agatston units.  The LAD was heavily calcified.  IMPRESSION: Coronary artery calcium score of 408.5 Agatston units places the patient in the 97th percentile for his age and gender, suggesting high risk of future cardiac events.  A coronary artery calcium score > 400 Agatston units suggests elevated risk of obstructive CAD, stress testing could be considered.  **END ADDENDUM** SIGNED BY: Laurey Morale   05/02/2012  OVER-READ INTERPRETATION - CT CHEST  The following report is an over-read performed by radiologist Dr. Karn Cassis. Reche Dixon, M.D. of [facility] on 05/02/2012 16:30:40.  This over-read does not include interpretation of  cardiac or coronary anatomy or pathology.  The coronary calcium score interpretation by the cardiologist is attached.  Findings: Lung windows demonstrate 3 mm subpleural right middle lobe lung nodule on image 17 of series 4.  Mild nonspecific mosaic attenuation within the lung bases.  Soft tissue windows demonstrate no imaged thoracic adenopathy, given limitations of unenhanced CT.  Air fluid level within the thoracic esophagus on image 26 of series 3.  Normal thoracic aortic caliber.  Pericardial calcifications, including along the undersurface of the heart on image 53 of series 5. No pericardial or pleural effusion.   Limited abdominal imaging demonstrates no significant findings. No acute osseous abnormality.  IMPRESSION  1.  Extracardiac findings pertinent for a 3 mm right middle lobe lung nodule.  Favor a subpleural lymph node.  If the patient is at high risk for bronchogenic carcinoma, follow-up chest CT at 1 year is recommended.  If the patient is at low risk, no follow-up is needed.   This recommendation follows the consensus statement: "Guidelines for Management of Small Pulmonary Nodules Detected on CT Scans:  A Statement from the Fleischner Society" as published in Radiology 2005; 237:395-400.  Available online at: DietDisorder.cz. 2.  Pericardial calcifications, suggesting prior pericardial effusion. 3. Esophageal air fluid level suggests dysmotility or gastroesophageal reflux.  Original Report Authenticated By: [email or web address]    PHYSICAL EXAM General: Well developed, well nourished, in no acute distress. Head: Normocephalic, atraumatic, sclera non-icteric, no xanthomas, nares are without discharge. Neck: Negative for carotid bruits. JVD not elevated. Lungs: Clear bilaterally to auscultation without wheezes, rales, or rhonchi. Breathing is unlabored. Heart: RRR S1 S2 without murmurs, rubs, or gallops.  Abdomen: Soft, non-tender, non-distended with normoactive bowel sounds. No hepatomegaly. No rebound/guarding. No obvious abdominal masses. Msk:  Strength and tone appears normal for age. Extremities: No clubbing, cyanosis or edema.  Distal pedal pulses are 2+ and equal bilaterally. Neuro: Alert and oriented X 3. Moves all extremities spontaneously. Psych:  Responds to questions appropriately with a normal affect.  ASSESSMENT AND PLAN: 1. NSTEMI post markedly abnormal stress Echo yesterday. Patient completely pain free. On IV heparin. For cardiac cath +/- PCI today. Procedure and risks explained in detail to patient and he is agreeable to proceed.  2. IDDM  3. HTN  4.  Hyperlipidemia. On Lipitor and fenofibrate. Lipid panel pending.   5. Hypothyroidism- thyroid levels good.  6. Strong family history of premature CAD  Principal Problem:  *NSTEMI (non-ST elevated myocardial infarction) Active Problems:  Hypertension  IDDM (insulin dependent diabetes mellitus)  Hyperlipidemia    Leonides Schanz Fradel Baldonado [place] MD,FACC 05/03/2012 7:57 AM

## 2012-05-04 ENCOUNTER — Encounter (HOSPITAL_COMMUNITY): Payer: Self-pay | Admitting: Physician Assistant

## 2012-05-04 DIAGNOSIS — R911 Solitary pulmonary nodule: Secondary | ICD-10-CM

## 2012-05-04 DIAGNOSIS — E785 Hyperlipidemia, unspecified: Secondary | ICD-10-CM

## 2012-05-04 LAB — BASIC METABOLIC PANEL
BUN: 18 mg/dL (ref 6–23)
CO2: 23 mEq/L (ref 19–32)
Chloride: 103 mEq/L (ref 96–112)
GFR calc non Af Amer: >90 mL/min (ref >90)
Glucose, Bld: 123 mg/dL — ABNORMAL HIGH (ref 70–99)
Potassium: 3.6 mEq/L (ref 3.5–5.1)
Sodium: 136 mEq/L (ref 135–145)

## 2012-05-04 LAB — CBC
HCT: 40.6 % (ref 39.0–52.0)
Hemoglobin: 13.8 g/dL (ref 13.0–17.0)
MCHC: 34 g/dL (ref 30.0–36.0)
RBC: 4.43 MIL/uL (ref 4.22–5.81)
WBC: 7.7 10*3/uL (ref 4.0–10.5)

## 2012-05-04 LAB — GLUCOSE, CAPILLARY: Glucose-Capillary: 123 mg/dL — ABNORMAL HIGH (ref 70–99)

## 2012-05-04 MED ORDER — NITROGLYCERIN 0.4 MG SL SUBL: 0.4000 mg | SUBLINGUAL_TABLET | SUBLINGUAL | Status: DC | PRN

## 2012-05-04 MED ORDER — CARVEDILOL 6.25 MG PO TABS: 6.2500 mg | ORAL_TABLET | Freq: Two times a day (BID) | ORAL | Status: DC

## 2012-05-04 MED ORDER — ATORVASTATIN CALCIUM 80 MG PO TABS: 80.0000 mg | ORAL_TABLET | Freq: Every day | ORAL | Status: DC

## 2012-05-04 MED ORDER — PRASUGREL HCL 10 MG PO TABS: 10.0000 mg | ORAL_TABLET | Freq: Every day | ORAL | Status: DC

## 2012-05-04 MED ORDER — ATORVASTATIN CALCIUM 40 MG PO TABS: 80.0000 mg | ORAL_TABLET | Freq: Every day | ORAL | Status: DC

## 2012-05-04 MED ORDER — ASPIRIN 81 MG PO TBEC: 81.0000 mg | DELAYED_RELEASE_TABLET | Freq: Every day | ORAL | Status: AC

## 2012-05-04 NOTE — Discharge Summary (Signed)
Discharge Summary   Patient ID: [name] MRN: [id number], DOB/AGE: 02-15-1966 46 y.o. Admit date: 05/02/2012 D/C date:     05/04/2012    Primary Care Physician:  Everlean Cherry, MD  Primary Cardiologist:  Dr. Rollene Rotunda  Primary Electrophysiologist:  None   Reason for Admission:  Abnormal stress test  Primary Discharge Diagnoses:  1.  NSTEMI      A.  S/p Xience DES x 2 to LAD and Xience DES x 1 to CFX this admission 2.  Coronary Artery Disease  Secondary Discharge Diagnoses:   Past Medical History  Diagnosis Date  . Hypertension   . Hypothyroidism   . Family history of anesthesia complication     nausea   . Coronary artery disease     s/p NSTEMI after ETT-echo - LHC 7/13:  LM < 10%, pLAD 90%, mLAD 95%, pCFX tandem 90%, mCFX 80-90%, pRCA 30%, mRCA 40-50%;  PCI: Xience DES x 2 to prox and mid LAD; Xience DES x 1 to prox and mid CFX  . Diabetes mellitus     insulin dependent  . HLD (hyperlipidemia)   . Lung nodule     CT 7/13: 3 mm RML nodule - no further w/u if low risk for CA     Procedures Performed This Admission:    Coronary angiography 04/03/2102:  Coronary dominance: right  Left mainstem: Mild irregularities less than 10%.  Left anterior descending (LAD): There is a segmental 90% stenosis in the proximal LAD. In the mid LAD there is a long 95% stenosis in a tortuous segment.  Left circumflex (LCx): The left circumflex gives rise to a very large first marginal branch and continues in the AV groove giving off several posterior lateral branches. The first marginal branch has tandem lesions up to 90% in the proximal vessel and 80-90% in the mid vessel. The remainder of the circumflex is without significant disease.  Right coronary artery (RCA): The right coronary is a dominant vessel. It has diffuse 30% disease proximally. In the mid vessel there is a 40-50% stenosis.  Left ventriculography: Not performed   PCI Data:  Vessel - LAD/Segment - proximal and mid    Stent 3.25 x 18 mm his Xience Xpedition and 2.75 x 28 mm Xience Xpedition respectively   Vessel #2-first obtuse marginal vessel-proximal and mid  Stent 3.0 x 33 mm [facility] Course: [name] is a 46 y.o. male who saw Dr. Rollene Rotunda for an abnormal EKG noted at the time of eye surgery.  He had anterolateral TW inversions.  He has a hx of DM2, HTN, HL.  He had a high calcium score on CTA.  Also, he had an ETT-Echo performed which was abnormal.  He had significant 3-4 mm ST depression in the anterior and anterolateral leads in recovery and multiple wall motion abnormalities on echo suggestive of multivessel disease.  Of note, EF at baseline was normal.  He was admitted for further evaluation and treatment.  He did rule in with a small bump in his Troponins.  He underwent LHC with Dr. [place] 05/03/12 that demonstrated severe 2 vessel CAD as noted above.  He underwent successful PCI with DES x 2 to the LAD and DES x 1 to the CFX.  He needs DAPT for at least one year and aggressive risk factor modification.  He is doing well today without complaints.  He is being seen by cardiac rehab.  His Lipitor will be increased to  80 mg QD as his LDL was over 70.  Coreg was added to his regimen this admission.  He did have a small nodule noted on CT.  He is low risk for bronchogenic CA and the recommendation is for no further workup.  He was seen by Dr. Zackery Barefoot today and felt ready for d/c to home.    Discharge Vitals: Blood pressure 123/69, pulse 72, temperature 98.1 F (36.7 C), temperature source Oral, resp. rate 14, height 6\' 1"  (1.854 m), weight 208 lb 12.4 oz (94.7 kg), SpO2 97.00%.  Labs: Lab Results  Component Value Date   WBC 7.7 05/04/2012   HGB 13.8 05/04/2012   HCT 40.6 05/04/2012   MCV 91.6 05/04/2012   PLT 333 05/04/2012     Lab 05/04/12 0630 05/02/12 1930  NA 136 --  K 3.6 --  CL 103 --  CO2 23 --  BUN 18 --  CREATININE 0.95 --  CALCIUM 9.2 --  PROT -- 7.4   BILITOT -- 0.5  ALKPHOS -- 36*  ALT -- 29  AST -- 35  GLUCOSE 123* --    Basename 05/03/12 0655 05/03/12 0030 05/02/12 1855  CKTOTAL 81 90 101  CKMB 4.2* 4.0 3.8  TROPONINI 0.31* 0.44* <0.30   Lab Results  Component Value Date   CHOL 148 05/03/2012   HDL 43 05/03/2012   LDLCALC 90 05/03/2012   TRIG 74 05/03/2012   No results found for this basename: DDIMER   Lab Results  Component Value Date   TSH 1.591 05/02/2012    Diagnostic Studies/Procedures:  Dg Chest 2 View 04/30/2012  IMPRESSION: No acute cardiopulmonary disease.  Original Report Authenticated By: Consuello Bossier, M.D.   Ct Cardiac Scoring 05/02/2012  **ADDENDUM** CREATED: 05/02/2012 17:08:51  CARDIAC CTA WITH CALCIUM SCORE 05/02/2012 16:00:00     IMPRESSION: Coronary artery calcium score of 408.5 Agatston units places the patient in the 97th percentile for his age and gender, suggesting high risk of future cardiac events.  A coronary artery calcium score > 400 Agatston units suggests elevated risk of obstructive CAD, stress testing could be considered.  **END ADDENDUM** SIGNED BY: Laurey Morale   05/02/2012  OVER-READ INTERPRETATION - CT CHEST  The following report is an over-read performed by radiologist Dr. Karn Cassis. Reche Dixon, M.D. of [facility] on 05/02/2012 16:30:40.  IMPRESSION  1.  Extracardiac findings pertinent for a 3 mm right middle lobe lung nodule.  Favor a subpleural lymph node.  If the patient is at high risk for bronchogenic carcinoma, follow-up chest CT at 1 year is recommended.  If the patient is at low risk, no follow-up is needed.   This recommendation follows the consensus statement: "Guidelines for Management of Small Pulmonary Nodules Detected on CT Scans:  A Statement from the Fleischner Society" as published in Radiology 2005; 237:395-400.  Available online at: DietDisorder.cz. 2.  Pericardial calcifications, suggesting prior pericardial effusion. 3. Esophageal  air fluid level suggests dysmotility or gastroesophageal reflux.  Original Report Authenticated By: [facility]    Discharge Medications   Medication List  As of 05/04/2012  9:11 AM   TAKE these medications         aspirin 81 MG EC tablet   Take 1 tablet (81 mg total) by mouth daily.      atorvastatin 40 MG tablet   Commonly known as: LIPITOR   Take 2 tablets (80 mg total) by mouth daily.      carvedilol 6.25 MG tablet  Commonly known as: COREG   Take 1 tablet (6.25 mg total) by mouth 2 (two) times daily with a meal.      Coenzyme Q-10 100 MG capsule   Take 100 mg by mouth daily.      fenofibrate 160 MG tablet   Take 160 mg by mouth daily.      insulin glargine 100 UNIT/ML injection   Commonly known as: LANTUS   Inject 45 Units into the skin at bedtime.      insulin lispro 100 UNIT/ML injection   Commonly known as: HUMALOG   Inject into the skin 3 (three) times daily before meals. Sliding scale. 1 unit per 10 grams of carbs at each meal      levothyroxine 50 MCG tablet   Commonly known as: SYNTHROID, LEVOTHROID   Take 50 mcg by mouth daily.      losartan 50 MG tablet   Commonly known as: COZAAR   Take 50 mg by mouth daily.      nitroGLYCERIN 0.4 MG SL tablet   Commonly known as: NITROSTAT   Place 1 tablet (0.4 mg total) under the tongue every 5 (five) minutes x 3 doses as needed for chest pain.      prasugrel 10 MG Tabs   Commonly known as: EFFIENT   Take 1 tablet (10 mg total) by mouth daily with breakfast.      SYMLINPEN 60 Enlow   Inject 45 mcg into the skin 2 (two) times daily before lunch and supper.            Disposition   The patient will be discharged in stable condition to home. Discharge Orders    Future Orders Please Complete By Expires   Diet - low sodium heart healthy      Comments:   Diabetic   Increase activity slowly      Driving Restrictions      Comments:   No driving for 1 week   Lifting restrictions      Comments:   No lifting over  10 lbs for 2 weeks.   Sexual Activity Restrictions      Comments:   None for 2 weeks.   Discharge wound care:      Comments:   Call for bruising, bleeding, swelling or fever.     Follow-up Information    Follow up with Rollene Rotunda, MD in 2 weeks. (office will call to arrange an appointment with Tereso Newcomer, PA-C for Dr. Antoine Poche)    Contact information:   [address] [place] [address] [phone]       Follow up with Everlean Cherry, MD. (as directed)            Duration of Discharge Encounter: Greater than 30 minutes including physician and PA time.  Pending Labs:  Needs FLP and LFTs in 6 weeks.   Signed, Tereso Newcomer, PA-C  9:11 AM 05/04/2012 Patient seen and examined. I agree with the assessment and plan as detailed above. See also my additional thoughts below.   See my progress note from today. Willa Rough, MD, [facility] 05/04/2012 12:19 PM          [address] [place], [place]  [address] Phone: [phone] Fax:  [phone]

## 2012-05-04 NOTE — Progress Notes (Signed)
Progress Note  Name:  [name]  Date:  05/04/2012 7:38 AM    Subjective:  No chest pain or dyspnea.  Getting ready to walk with cardiac rehab.    Objective:   Filed Vitals:   05/03/12 1935 05/04/12 0020 05/04/12 0500 05/04/12 0600  BP: 134/51 113/59  112/61  Pulse: 86 72    Temp: 97.2 F (36.2 C) 97.9 F (36.6 C)  97.1 F (36.2 C)  TempSrc: Oral Oral  Oral  Resp: 18 14  14   Height:      Weight:   208 lb (94.348 kg) 208 lb 12.4 oz (94.7 kg)  SpO2: 97% 97%  96%    Intake/Output Summary (Last 24 hours) at 05/04/12 0738 Last data filed at 05/03/12 2200  Gross per 24 hour  Intake 1034.3 ml  Output    650 ml  Net  384.3 ml    Tele:  NSR EKG:  Normal sinus rhythm, HR 63, ST & T wave abnormality, consider anterolateral ischemia, no change from prior.   PHYSICAL EXAM General: no acute distress Neck:  No JVD Lungs: Clear to auscultation bilaterally  Heart: normal S1, S2;  Regular rate and rhythm, no murmur Abdomen: soft, nontender Extremities: no edema; right wrist without hematoma or bruit  Skin:  No rash; warm and dry Psych:  Normal affect  Labs:  [facility] 05/04/12 0630 05/02/12 1930  NA 136 140  K 3.6 4.0  CL 103 104  CO2 23 24  GLUCOSE 123* 94  BUN 18 17  CREATININE 0.95 0.96  CALCIUM 9.2 9.7  MG -- --  PHOS -- --    Basename 05/02/12 1930  AST 35  ALT 29  ALKPHOS 36*  BILITOT 0.5  PROT 7.4  ALBUMIN 4.3   No results found for this basename: LIPASE:2,AMYLASE:2 in the last 72 hours  Basename 05/04/12 0630 05/03/12 0655 05/02/12 1930  WBC 7.7 7.5 --  NEUTROABS -- -- 4.5  HGB 13.8 14.9 --  HCT 40.6 43.9 --  MCV 91.6 92.2 --  PLT 333 321 --    Basename 05/03/12 0655 05/03/12 0030 05/02/12 1855  CKTOTAL 81 90 101  CKMB 4.2* 4.0 3.8  TROPONINI 0.31* 0.44* <0.30   No components found with this basename: POCBNP:3 No results found for this basename: DDIMER in the last 72 hours No results found for this basename: HGBA1C in the last 72  hours  Basename 05/03/12 0655  CHOL 148  HDL 43  LDLCALC 90  TRIG 74  CHOLHDL 3.4    Basename 05/02/12 1930  TSH 1.591  T4TOTAL --  T3FREE --  THYROIDAB --   No results found for this basename: VITAMINB12,FOLATE,FERRITIN,TIBC,IRON,RETICCTPCT in the last 72 hours  Radiology/Studies:  Dg Chest 2 View  04/30/2012    IMPRESSION: No acute cardiopulmonary disease.  Original Report Authenticated By: Consuello Bossier, M.D.   Ct Cardiac Scoring  05/02/2012  **ADDENDUM** CREATED: 05/02/2012 17:08:51    IMPRESSION: Coronary artery calcium score of 408.5 Agatston units places the patient in the 97th percentile for his age and gender, suggesting high risk of future cardiac events.  A coronary artery calcium score > 400 Agatston units suggests elevated risk of obstructive CAD, stress testing could be considered.  **END ADDENDUM** SIGNED BY: Laurey Morale   05/02/2012  OVER-READ INTERPRETATION - CT CHEST  The following report is an over-read performed by radiologist Dr. Karn Cassis. Reche Dixon, M.D. of [facility] on 05/02/2012 16:30:40.  IMPRESSION  1.  Extracardiac  findings pertinent for a 3 mm right middle lobe lung nodule.  Favor a subpleural lymph node.  If the patient is at high risk for bronchogenic carcinoma, follow-up chest CT at 1 year is recommended.  If the patient is at low risk, no follow-up is needed.   This recommendation follows the consensus statement: "Guidelines for Management of Small Pulmonary Nodules Detected on CT Scans:  A Statement from the Fleischner Society" as published in Radiology 2005; 237:395-400.  Available online at: DietDisorder.cz. 2.  Pericardial calcifications, suggesting prior pericardial effusion. 3. Esophageal air fluid level suggests dysmotility or gastroesophageal reflux.  Original Report Authenticated By: [email or web address]     Assessment and Plan:  NSTEMI (non-ST elevated myocardial infarction) Small troponin bump after  ETT-Echo. LHC yesterday with severe disease in the LAD and CFX, non-obs disease in the RCA. He is s/p 2 vessel PCI with DES x 2 to the prox and mid LAD and DES x 1 to the prox and mid CFX. He needs DAPT x 1 year and aggressive RF modification. Doing well this am.  Plan d/c to home after seen by Dr. Zackery Barefoot. Follow up in office in 2 weeks.   Hypertension Controlled.  Continue current therapy.   IDDM (insulin dependent diabetes mellitus) Continue current Rx.  Hyperlipidemia LDL 90 on Lipitor 40. Increase Lipitor to 80 mg QD and follow up Lipids and LFTs in 6 weeks (goal LDL < 70).  Lung nodule 3 mm RML nodule on cardiac CT.  Recommendation is no follow up if patient low risk for bronchogenic CA. Patient denies hx of smoking, hx of cancer or strong family hx of lung cancer. According to recommendations, no further testing indicated.   Signed, Tereso Newcomer, PA-C  7:38 AM 05/04/2012    Patient seen and examined. I agree with the assessment and plan as detailed above. See also my additional thoughts below.   Patient is stable and ready to go home. Followup is being arranged. We will let him go home today.  Willa Rough, MD, [facility] 05/04/2012 8:33 AM

## 2012-05-04 NOTE — Progress Notes (Signed)
CARDIAC REHAB PHASE I   PRE:  Rate/Rhythm:73 SR  BP:  Supine:  Sitting: 126/60  Standing:    SaO2: 97%ra  MODE:  Ambulation: 384ft   POST:  Rate/Rhythem: 82 sr  BP:  Supine:   Sitting: 128/58  Standing:    SaO2: 99%ra  Pt ambulated unit steady. VSS. Education done with pt and family with understanding. Permission for phae II.  Rosalie Doctor

## 2012-05-06 MED FILL — Dextrose Inj 5%: INTRAVENOUS | Qty: 50 | Status: AC

## 2012-05-07 ENCOUNTER — Ambulatory Visit (INDEPENDENT_AMBULATORY_CARE_PROVIDER_SITE_OTHER): Payer: BC Managed Care – PPO | Admitting: Ophthalmology

## 2012-05-07 ENCOUNTER — Telehealth: Payer: Self-pay | Admitting: Cardiology

## 2012-05-07 NOTE — Telephone Encounter (Signed)
Per pt - states during his walk today he noticed "alittle tightness" and "slight tingling" in his arm.  He states it lasted less than a minute. He denies any chest pain, SOB or N/V.  He is having no s/s at this time.  He does have SL ntg if s/s reoccur and last for any length of time.  Pt states understanding and is aware I will forward information to Dr Antoine Poche for his review.

## 2012-05-07 NOTE — Telephone Encounter (Signed)
New problem:  Patient wife calling . C/o some pain in chest. S/p cath

## 2012-05-09 NOTE — Telephone Encounter (Signed)
Spoke with pt who states he is not having any problems but will call back if someone occurs.

## 2012-05-09 NOTE — Telephone Encounter (Signed)
Reviewed with Dr Antoine Poche - no further workup is needed at this time.

## 2012-05-13 ENCOUNTER — Telehealth: Payer: Self-pay | Admitting: Cardiology

## 2012-05-13 NOTE — Telephone Encounter (Signed)
Patient wife Toy returning nurse call, she can be reached at [phone]

## 2012-05-13 NOTE — Telephone Encounter (Signed)
1200 or less of sodium a day.  Last night around 11:00 pm 89/39 after his medications. Denies any symptoms at all, just no energy  Losartan 50 mg hs Carvedilol 6.25 mg 1/2 twice a day  Walking each day adding one minute a day and diastolic will only be in the 50's.  Highest systolic has been is 107

## 2012-05-13 NOTE — Telephone Encounter (Signed)
Discussed with Dr Excell Seltzer and patient will stop Carvedilol altogether and follow up as scheduled next week.  Will call back if blood pressure continues to be low.

## 2012-05-13 NOTE — Telephone Encounter (Signed)
Patient Wife [name] [phone], request return call   Catherization on 7/11, still having issues w/ BP (87/40 7/21 before bedtime). Pt has an appnt on Monday but wife is concerned as conditions haven't improved. Pt taking beta blocker, RX was cut in half  Per on call Nurse over the weekend.

## 2012-05-20 ENCOUNTER — Ambulatory Visit (INDEPENDENT_AMBULATORY_CARE_PROVIDER_SITE_OTHER): Payer: BC Managed Care – PPO | Admitting: Physician Assistant

## 2012-05-20 ENCOUNTER — Encounter: Payer: Self-pay | Admitting: Physician Assistant

## 2012-05-20 VITALS — BP 110/68 | HR 64 | Ht 73.0 in | Wt 206.4 lb

## 2012-05-20 DIAGNOSIS — I10 Essential (primary) hypertension: Secondary | ICD-10-CM

## 2012-05-20 DIAGNOSIS — R911 Solitary pulmonary nodule: Secondary | ICD-10-CM

## 2012-05-20 DIAGNOSIS — E785 Hyperlipidemia, unspecified: Secondary | ICD-10-CM

## 2012-05-20 DIAGNOSIS — E1139 Type 2 diabetes mellitus with other diabetic ophthalmic complication: Secondary | ICD-10-CM

## 2012-05-20 DIAGNOSIS — I2510 Atherosclerotic heart disease of native coronary artery without angina pectoris: Secondary | ICD-10-CM

## 2012-05-20 DIAGNOSIS — E11359 Type 2 diabetes mellitus with proliferative diabetic retinopathy without macular edema: Secondary | ICD-10-CM

## 2012-05-20 DIAGNOSIS — H579 Unspecified disorder of eye and adnexa: Secondary | ICD-10-CM

## 2012-05-20 MED ORDER — LOSARTAN POTASSIUM 25 MG PO TABS: 25.0000 mg | ORAL_TABLET | Freq: Every day | ORAL | Status: DC

## 2012-05-20 NOTE — Patient Instructions (Addendum)
Your physician recommends that you schedule a follow-up appointment in: APPROX 6 WEEKS WITH DR. [facility]  Your physician recommends that you return for lab work in: 07/22/12 APPROX FOR FASTING LIPID AND LIVER PANEL OR YOU MAY HAVE LABS DONE IN [place] WITH RESULTS FAXED TO [place], [facility] [phone] IF THIS IS EASIER FOR YOU  You have been referred to CARDIAC REHAB TO BE DONE AT [facility]  Your physician has recommended you make the following change in your medication: DECREASE LOSARTAN TO 25 MG DAILY A NEW PRESCRIPTION WAS SENT IN TODAY FOR THE 25 MG TABLET  YOU WILL NEED TO HAVE A CHEST CT W/O CONTRAST AROUND 1 YR  DX LUNG NODULE, ORDER PLACED TODAY

## 2012-05-20 NOTE — Progress Notes (Signed)
[address] [place], [place]  [address] Phone: [phone] Fax:  [phone]  Date:  05/20/2012   Name:  [name]   DOB:  Jun 05, 1966   MRN:  [id number]  PCP:  Everlean Cherry, MD  Primary Cardiologist:  Dr. Rollene Rotunda  Primary Electrophysiologist:  None    History of Present Illness: [name] is a 46 y.o. male who returns for post hospital follow up.  He has a history of HTN, HL, DM2.  He was admitted 7/11-7/13 after an abnormal ETT-echocardiogram.  He initially saw Dr. Antoine Poche for an abnormal EKG prior to eye surgery.  He had a high calcium score on CT angio.  He had significant changes on his stress echo and was admitted for further evaluation.  He had a small bump in his troponins and ruled in for NSTEMI.  LHC 05/03/12 demonstrated severe 2 vessel CAD of the proximal and mid LAD and proximal and mid OM1.  He underwent 2 vessel PCI with placement of a Xience DES x2 to the LAD and a Xience DES x1 to the circumflex.  Dual-antiplatelet therapy recommended for one year.  Lipitor dose was adjusted and Coreg was added to his medical regimen.  Of note, his chest CT did demonstrate a 3 mm right middle lobe lung nodule favorable for subpleural lymph node.  No further workup was recommended if the patient was low risk for bronchogenic carcinoma.  He is doing well.  Did have some trouble with low BPs and called in to the office.  Coreg was stopped.  Feels better.  Still weak.  Wife notes BPs no higher than 100 systolic at home.  No syncope.  No chest pain.  No dyspnea.  No orthopnea, PND, edema.  Still needs eye surgery.     Past Medical History  Diagnosis Date  . Hypertension   . Hypothyroidism   . Family history of anesthesia complication     nausea   . Coronary artery disease     s/p NSTEMI after ETT-echo - LHC 7/13:  LM < 10%, pLAD 90%, mLAD 95%, pCFX tandem 90%, mCFX 80-90%, pRCA 30%, mRCA 40-50%;  PCI: Xience DES x 2 to prox and mid LAD; Xience DES  x 1 to prox and mid CFX  . Diabetes mellitus     insulin dependent  . HLD (hyperlipidemia)   . Lung nodule     CT 7/13: 3 mm RML nodule - follow up CT in 1 year     Current Outpatient Prescriptions  Medication Sig Dispense Refill  . aspirin EC 81 MG EC tablet Take 1 tablet (81 mg total) by mouth daily.      Marland Kitchen atorvastatin (LIPITOR) 40 MG tablet Take 2 tablets (80 mg total) by mouth daily.  60 tablet  5  . Coenzyme Q-10 100 MG capsule Take 100 mg by mouth daily.      . fenofibrate 160 MG tablet Take 160 mg by mouth daily.      . insulin glargine (LANTUS) 100 UNIT/ML injection Inject 45 Units into the skin at bedtime.      . insulin lispro (HUMALOG) 100 UNIT/ML injection Inject into the skin 3 (three) times daily before meals. Sliding scale. 1 unit per 10 grams of carbs at each meal      . levothyroxine (SYNTHROID, LEVOTHROID) 50 MCG tablet Take 50 mcg by mouth daily.      Marland Kitchen losartan (COZAAR) 50 MG tablet Take 50 mg by mouth  daily.      . nitroGLYCERIN (NITROSTAT) 0.4 MG SL tablet Place 1 tablet (0.4 mg total) under the tongue every 5 (five) minutes x 3 doses as needed for chest pain.  25 tablet  11  . Pramlintide Acetate (SYMLINPEN 60 Miranda) Inject 45 mcg into the skin 2 (two) times daily before lunch and supper.      . prasugrel (EFFIENT) 10 MG TABS Take 1 tablet (10 mg total) by mouth daily with breakfast.  30 tablet  11    Allergies: No Known Allergies  History  Substance Use Topics  . Smoking status: Never Smoker   . Smokeless tobacco: Never Used  . Alcohol Use: No     ROS:  Please see the history of present illness.    All other systems reviewed and negative.   PHYSICAL EXAM: VS:  BP 110/68  Pulse 64  Ht 6\' 1"  (1.854 m)  Wt 206 lb 6.4 oz (93.622 kg)  BMI 27.23 kg/m2 Well nourished, well developed, in no acute distress HEENT: normal Neck: no JVD Vascular: no carotid bruits Cardiac:  normal S1, S2; RRR; no murmur Lungs:  clear to auscultation bilaterally, no wheezing,  rhonchi or rales Abd: soft, nontender, no hepatomegaly Ext: no edema; right wrist without hematoma or bruit  Skin: warm and dry Neuro:  CNs 2-12 intact, no focal abnormalities noted  EKG:  Sinus rhythm, heart rate 64, normal axis, nonspecific ST-T wave changes      ASSESSMENT AND PLAN:  1.  CAD Overall stable. Patient and wife had many questions today.  I spent 15 minutes answering all questions. We discussed the importance of dual antiplatelet therapy.  Refer to cardiac rehab. Follow up with Dr. Rollene Rotunda in 6 weeks.   2.  Hypertension Controlled. He has drastically changed his diet and his BPs are running lower.  He is somewhat symptomatic.  He was unable to tolerate coreg. Will decrease Losartan to 25 mg QD. He will contact us if BPs run higher.  3.  Hyperlipidemia Check Lipids and LFTs in 6 weeks.  4.  Lung nodule He is concerned about past exposures to environmental hazards. Arrange follow up CT in one year.  5.  Diabetic Retinopathy He still needs surgery for his eye. He will need to remain on ASA and Effient for one year. I have asked him to hold off on rescheduling for now.  Follow up with Dr. Rollene Rotunda in 6 weeks.  He can discuss further at that time.  Luna Glasgow, PA-C  8:52 AM 05/20/2012

## 2012-05-22 ENCOUNTER — Telehealth: Payer: Self-pay | Admitting: Cardiology

## 2012-05-22 NOTE — Telephone Encounter (Signed)
Have attempted several times to contact Dr Aleatha Borer office and line has been busy.

## 2012-05-22 NOTE — Telephone Encounter (Signed)
Per wife - pt saw his PCP who has requested pt have "ABI's".  She states Dr Martha Clan is concerned about the blood flow in his legs.  Wife states pt had pain in legs prior to cardiac cath and stent placement but does not have any now.  Will forward to Dr Antoine Poche for review and orders.  Wife is aware Dr Antoine Poche is out of the office this week.

## 2012-05-22 NOTE — Telephone Encounter (Signed)
New msg Pt's wife wants to talk to a nurse about getting ABI test. Please call

## 2012-05-22 NOTE — Telephone Encounter (Signed)
Fu call °Pt's wife called back  °

## 2012-05-22 NOTE — Telephone Encounter (Signed)
Returned call, left message to call back to discuss needs

## 2012-05-27 ENCOUNTER — Telehealth: Payer: Self-pay | Admitting: Cardiology

## 2012-05-27 NOTE — Telephone Encounter (Signed)
Pt's wife calling stating pt is not feeling well since c cath done 7/20--BP running low(87/44), is c/o pain in legs, no energy--wife very concerned--advised no sooner appoint with dr hochrein, but l gerhardt n.p. Has available appoint. Friday 8/9 at 1:30pm --pt's wife agrees to bring him in

## 2012-05-27 NOTE — Telephone Encounter (Signed)
Please return call to patient wife [phone], pt BP dropped to 87/44 very sporadic at this point.  Pt not feeling well please return call

## 2012-05-30 NOTE — Telephone Encounter (Signed)
Pt has an appt 8/9 and will follow up then

## 2012-05-31 ENCOUNTER — Encounter: Payer: Self-pay | Admitting: Nurse Practitioner

## 2012-05-31 ENCOUNTER — Ambulatory Visit (INDEPENDENT_AMBULATORY_CARE_PROVIDER_SITE_OTHER): Payer: BC Managed Care – PPO | Admitting: Nurse Practitioner

## 2012-05-31 VITALS — BP 118/60 | HR 66 | Ht 72.0 in | Wt 205.8 lb

## 2012-05-31 DIAGNOSIS — M79609 Pain in unspecified limb: Secondary | ICD-10-CM

## 2012-05-31 DIAGNOSIS — M79605 Pain in left leg: Secondary | ICD-10-CM | POA: Insufficient documentation

## 2012-05-31 DIAGNOSIS — I739 Peripheral vascular disease, unspecified: Secondary | ICD-10-CM

## 2012-05-31 DIAGNOSIS — I2510 Atherosclerotic heart disease of native coronary artery without angina pectoris: Secondary | ICD-10-CM

## 2012-05-31 DIAGNOSIS — I10 Essential (primary) hypertension: Secondary | ICD-10-CM

## 2012-05-31 DIAGNOSIS — M79604 Pain in right leg: Secondary | ICD-10-CM

## 2012-05-31 NOTE — Progress Notes (Signed)
[name] Date of Birth: 11/12/1965 Medical Record #[id number]  History of Present Illness: [name] is seen back today for a work in visit. He is seen for Dr. Antoine Poche. He has had recent NSTEMI following his stress echo here in the office a month ago. Peak troponin of 0.44. Had normal EF at the time of his stress test. He had cardiac cath and had 2 vessle PCI with DES to the LAD x 2 and DES to the LCX x 1. He is on Effient for at least one year. He has had long standing diabetes. He has not tolerated Coreg. BP meds were adjusted at his last visit due to low readings.   He comes in today. He is here with his wife. He has no chest pain but never actually had chest pain. He is feeling stronger and has more energy. Not short of breath. BP has come up nicely with reduction of his Losartan. He has been to cardiac rehab just 2 times. His complaint today is that prior to his stents, he was having "really bad" leg pain. It resolved after the stents but has now returned. It was in the front of his legs and sounded more like shin splints but now is in his calves. He is a non smoker. He saw his PCP who was concerned about PAD. Doppler study was arranged but they wished to come here instead. He has been doing a much better job in taking care of his diabetes, his diet and is now walking daily.   Current Outpatient Prescriptions on File Prior to Visit  Medication Sig Dispense Refill  . ACCU-CHEK AVIVA PLUS test strip 1 each by Other route as needed.       Marland Kitchen aspirin EC 81 MG EC tablet Take 1 tablet (81 mg total) by mouth daily.      Marland Kitchen atorvastatin (LIPITOR) 40 MG tablet Take 2 tablets (80 mg total) by mouth daily.  60 tablet  5  . Coenzyme Q-10 100 MG capsule Take 100 mg by mouth daily.      . fenofibrate 160 MG tablet Take 160 mg by mouth daily.      . insulin glargine (LANTUS) 100 UNIT/ML injection Inject 45 Units into the skin at bedtime.      . insulin lispro (HUMALOG) 100 UNIT/ML injection Inject into the  skin 3 (three) times daily before meals. Sliding scale. 1 unit per 10 grams of carbs at each meal      . levothyroxine (SYNTHROID, LEVOTHROID) 50 MCG tablet Take 50 mcg by mouth daily.      Marland Kitchen losartan (COZAAR) 25 MG tablet Take 1 tablet (25 mg total) by mouth daily.  30 tablet  11  . nitroGLYCERIN (NITROSTAT) 0.4 MG SL tablet Place 1 tablet (0.4 mg total) under the tongue every 5 (five) minutes x 3 doses as needed for chest pain.  25 tablet  11  . prasugrel (EFFIENT) 10 MG TABS Take 1 tablet (10 mg total) by mouth daily with breakfast.  30 tablet  11    No Known Allergies  Past Medical History  Diagnosis Date  . Hypertension   . Hypothyroidism   . Family history of anesthesia complication     nausea   . Coronary artery disease     s/p NSTEMI after ETT-echo - LHC 7/13:  LM < 10%, pLAD 90%, mLAD 95%, pCFX tandem 90%, mCFX 80-90%, pRCA 30%, mRCA 40-50%;  PCI: Xience DES x 2 to prox and mid LAD;  Xience DES x 1 to prox and mid CFX  . Diabetes mellitus     insulin dependent  . HLD (hyperlipidemia)   . Lung nodule     CT 7/13: 3 mm RML nodule - follow up CT in 1 year     Past Surgical History  Procedure Date  . Appendectomy   . Cholecystectomy   . Cardiac catheterization   . Coronary angioplasty     LAD    History  Smoking status  . Never Smoker   Smokeless tobacco  . Never Used    History  Alcohol Use No    Family History  Problem Relation Age of Onset  . Retinitis pigmentosa Father   . Coronary artery disease Brother 23    Died  . Heart attack Brother   . Heart disease Brother   . Coronary artery disease Maternal Uncle 7    Died  . Coronary artery disease Maternal Grandmother 41    Died  . Aneurysm Maternal Grandfather 85    Abdominal  . Aneurysm Daughter 16    Cerebral  . Hypertension Mother     Review of Systems: The review of systems is per the HPI.  All other systems were reviewed and are negative.  Physical Exam: BP 118/60  Pulse 66  Ht 6' (1.829  m)  Wt 205 lb 12.8 oz (93.35 kg)  BMI 27.91 kg/m2 Patient is very pleasant and in no acute distress. Skin is warm and dry. Color is normal.  HEENT is unremarkable. Normocephalic/atraumatic. PERRL. Sclera are nonicteric. Neck is supple. No masses. No JVD. Lungs are clear. Cardiac exam shows a regular rate and rhythm. Abdomen is soft. Extremities are without edema. He has +1 pulses noted. Sparse hair growth. Gait and ROM are intact. No gross neurologic deficits noted.   LABORATORY DATA:   Assessment / Plan:

## 2012-05-31 NOTE — Assessment & Plan Note (Signed)
He is doing well from a cardiac standpoint.  

## 2012-05-31 NOTE — Assessment & Plan Note (Signed)
Blood pressure has come up nicely. He will continue to monitor at home.

## 2012-05-31 NOTE — Patient Instructions (Addendum)
Stay on your current medicines  We will arrange for an arterial doppler study on your legs  Call the [facility] office at [phone] if you have any questions, problems or concerns.

## 2012-05-31 NOTE — Assessment & Plan Note (Signed)
He could certainly have PAD. Has risk factors, especially his diabetes. Pulses are somewhat diminished. We will go ahead and arrange for arterial dopplers here in our office. He is otherwise felt to be doing well. No change in his current medicines. We will see him back in September. Patient is agreeable to this plan and will call if any problems develop in the interim.

## 2012-06-07 ENCOUNTER — Encounter (INDEPENDENT_AMBULATORY_CARE_PROVIDER_SITE_OTHER): Payer: BC Managed Care – PPO

## 2012-06-07 DIAGNOSIS — E1159 Type 2 diabetes mellitus with other circulatory complications: Secondary | ICD-10-CM

## 2012-06-07 DIAGNOSIS — I739 Peripheral vascular disease, unspecified: Secondary | ICD-10-CM

## 2012-06-11 ENCOUNTER — Ambulatory Visit (INDEPENDENT_AMBULATORY_CARE_PROVIDER_SITE_OTHER): Payer: BC Managed Care – PPO | Admitting: Ophthalmology

## 2012-06-11 ENCOUNTER — Telehealth: Payer: Self-pay | Admitting: *Deleted

## 2012-06-11 ENCOUNTER — Encounter: Payer: Self-pay | Admitting: *Deleted

## 2012-06-11 NOTE — Telephone Encounter (Signed)
Called patient with results.  Mailed copy.  Vista Mink, CMA

## 2012-06-11 NOTE — Telephone Encounter (Signed)
Message copied by Awilda Bill on Tue Jun 11, 2012  1:44 PM ------      Message from: Rosalio Macadamia      Created: Mon Jun 10, 2012  4:25 PM       Ok to report. Arterial dopplers are normal.

## 2012-06-25 ENCOUNTER — Encounter: Payer: Self-pay | Admitting: Physician Assistant

## 2012-06-25 ENCOUNTER — Telehealth: Payer: Self-pay | Admitting: *Deleted

## 2012-06-25 NOTE — Telephone Encounter (Signed)
pt notified about lab results with verbal understanding today.  

## 2012-06-25 NOTE — Telephone Encounter (Signed)
Message copied by Tarri Fuller on Tue Jun 25, 2012  2:47 PM ------      Message from: [place], [place] T      Created: Tue Jun 25, 2012  2:04 PM       Lipids look good:  LDL 37      Continue with current treatment plan.      Tereso Newcomer, PA-C  2:04 PM 06/25/2012

## 2012-06-26 ENCOUNTER — Ambulatory Visit (INDEPENDENT_AMBULATORY_CARE_PROVIDER_SITE_OTHER): Payer: BC Managed Care – PPO | Admitting: Cardiology

## 2012-06-26 ENCOUNTER — Other Ambulatory Visit (INDEPENDENT_AMBULATORY_CARE_PROVIDER_SITE_OTHER): Payer: BC Managed Care – PPO

## 2012-06-26 ENCOUNTER — Encounter: Payer: Self-pay | Admitting: Cardiology

## 2012-06-26 VITALS — BP 118/70 | HR 62 | Ht 72.0 in | Wt 201.4 lb

## 2012-06-26 DIAGNOSIS — I214 Non-ST elevation (NSTEMI) myocardial infarction: Secondary | ICD-10-CM

## 2012-06-26 DIAGNOSIS — I2510 Atherosclerotic heart disease of native coronary artery without angina pectoris: Secondary | ICD-10-CM

## 2012-06-26 DIAGNOSIS — I10 Essential (primary) hypertension: Secondary | ICD-10-CM

## 2012-06-26 DIAGNOSIS — E785 Hyperlipidemia, unspecified: Secondary | ICD-10-CM

## 2012-06-26 DIAGNOSIS — R0989 Other specified symptoms and signs involving the circulatory and respiratory systems: Secondary | ICD-10-CM

## 2012-06-26 NOTE — Progress Notes (Signed)
HPI The patient presents for followup of his coronary disease. He is now status post multivessel PCI. He's been back a couple of times since that visit as he did have some problems with low blood pressure. His meds have been adjusted and he's been feeling well. He's lost about 20 pounds. His exercise regimen he is excellent and he is doing cardiac rehabilitation. The patient denies any new symptoms such as chest discomfort, neck or arm discomfort. There has been no new shortness of breath, PND or orthopnea. There have been no reported palpitations, presyncope or syncope.    No Known Allergies  Current Outpatient Prescriptions  Medication Sig Dispense Refill  . ACCU-CHEK AVIVA PLUS test strip 1 each by Other route as needed.       Marland Kitchen aspirin EC 81 MG EC tablet Take 1 tablet (81 mg total) by mouth daily.      Marland Kitchen atorvastatin (LIPITOR) 40 MG tablet Take 2 tablets (80 mg total) by mouth daily.  60 tablet  5  . Coenzyme Q-10 100 MG capsule Take 100 mg by mouth daily.      . fenofibrate 160 MG tablet Take 160 mg by mouth daily.      . insulin glargine (LANTUS) 100 UNIT/ML injection Inject 35 Units into the skin at bedtime.       . insulin lispro (HUMALOG) 100 UNIT/ML injection Inject into the skin 3 (three) times daily before meals. Sliding scale. 1 unit per 10 grams of carbs at each meal      . levothyroxine (SYNTHROID, LEVOTHROID) 50 MCG tablet Take 50 mcg by mouth daily.      Marland Kitchen losartan (COZAAR) 25 MG tablet Take 1 tablet (25 mg total) by mouth daily.  30 tablet  11  . nitroGLYCERIN (NITROSTAT) 0.4 MG SL tablet Place 1 tablet (0.4 mg total) under the tongue every 5 (five) minutes x 3 doses as needed for chest pain.  25 tablet  11  . prasugrel (EFFIENT) 10 MG TABS Take 1 tablet (10 mg total) by mouth daily with breakfast.  30 tablet  11    Past Medical History  Diagnosis Date  . Hypertension   . Hypothyroidism   . Family history of anesthesia complication     nausea   . Coronary artery  disease     s/p NSTEMI after ETT-echo - [facility] 7/13:  LM < 10%, pLAD 90%, mLAD 95%, pCFX tandem 90%, mCFX 80-90%, pRCA 30%, mRCA 40-50%;  PCI: Xience DES x 2 to prox and mid LAD; Xience DES x 1 to prox and mid CFX  . Diabetes mellitus     insulin dependent  . HLD (hyperlipidemia)   . Lung nodule     CT 7/13: 3 mm RML nodule - follow up CT in 1 year     Past Surgical History  Procedure Date  . Appendectomy   . Cholecystectomy   . Cardiac catheterization   . Coronary angioplasty     LAD    ROS:  As stated in the HPI and negative for all other systems.   PHYSICAL EXAM BP 118/70  Pulse 62  Ht 6' (1.829 m)  Wt 201 lb 6.4 oz (91.354 kg)  BMI 27.31 kg/m2 GENERAL:  Well appearing HEENT:  Pupils equal round and reactive, fundi not visualized, oral mucosa unremarkable NECK:  No jugular venous distention, waveform within normal limits, carotid upstroke brisk and symmetric, no bruits, no thyromegaly LYMPHATICS:  No cervical, inguinal adenopathy LUNGS:  Clear to auscultation  bilaterally BACK:  No CVA tenderness CHEST:  Unremarkable HEART:  PMI not displaced or sustained,S1 and S2 within normal limits, no S3, no S4, no clicks, no rubs, no murmurs ABD:  Flat, positive bowel sounds normal in frequency in pitch, no bruits, no rebound, no guarding, no midline pulsatile mass, no hepatomegaly, no splenomegaly EXT:  2 plus pulses throughout, no edema, no cyanosis no clubbing SKIN:  No rashes no nodules NEURO:  Cranial nerves II through XII grossly intact, motor grossly intact throughout PSYCH:  Cognitively intact, oriented to person place and time   ASSESSMENT AND PLAN   CAD  The patient has no new sypmtoms.  No further cardiovascular testing is indicated.  We will continue with aggressive risk reduction and meds as listed.  (Greater than 40 minutes reviewing all data with greater than 50% face to face with the patient).  We discussed his situation at great length.   Hypertension  His blood  pressure is running low.  However, he tolerates this.  No change in therapy is indicated.  Hyperlipidemia  His last LDL (calculated) was 37.4.  HDL was 36.  He will continue the current medications and have this repeated in 3 months.  Lung nodule  No follow up was suggested by criteria .  However, we will discuss this again in the future.  Arrange follow up CT in one year.

## 2012-06-26 NOTE — Patient Instructions (Addendum)
The current medical regimen is effective;  continue present plan and medications.  Follow up in 4 months with Dr Hochrein.  You will receive a letter in the mail 2 months before you are due.  Please call us when you receive this letter to schedule your follow up appointment.  

## 2012-07-29 ENCOUNTER — Ambulatory Visit (INDEPENDENT_AMBULATORY_CARE_PROVIDER_SITE_OTHER): Payer: BC Managed Care – PPO | Admitting: Ophthalmology

## 2012-08-21 ENCOUNTER — Encounter (INDEPENDENT_AMBULATORY_CARE_PROVIDER_SITE_OTHER): Payer: BC Managed Care – PPO | Admitting: Ophthalmology

## 2012-08-21 DIAGNOSIS — H43819 Vitreous degeneration, unspecified eye: Secondary | ICD-10-CM

## 2012-08-21 DIAGNOSIS — E1039 Type 1 diabetes mellitus with other diabetic ophthalmic complication: Secondary | ICD-10-CM

## 2012-08-21 DIAGNOSIS — H4310 Vitreous hemorrhage, unspecified eye: Secondary | ICD-10-CM

## 2012-08-21 DIAGNOSIS — E11359 Type 2 diabetes mellitus with proliferative diabetic retinopathy without macular edema: Secondary | ICD-10-CM

## 2012-08-21 DIAGNOSIS — E1065 Type 1 diabetes mellitus with hyperglycemia: Secondary | ICD-10-CM

## 2012-08-21 DIAGNOSIS — H2510 Age-related nuclear cataract, unspecified eye: Secondary | ICD-10-CM

## 2012-10-28 ENCOUNTER — Other Ambulatory Visit (HOSPITAL_COMMUNITY): Payer: Self-pay | Admitting: Physician Assistant

## 2012-10-30 ENCOUNTER — Encounter (INDEPENDENT_AMBULATORY_CARE_PROVIDER_SITE_OTHER): Payer: BC Managed Care – PPO | Admitting: Ophthalmology

## 2012-10-30 DIAGNOSIS — E11359 Type 2 diabetes mellitus with proliferative diabetic retinopathy without macular edema: Secondary | ICD-10-CM

## 2012-10-30 DIAGNOSIS — H4310 Vitreous hemorrhage, unspecified eye: Secondary | ICD-10-CM

## 2012-10-30 DIAGNOSIS — E1039 Type 1 diabetes mellitus with other diabetic ophthalmic complication: Secondary | ICD-10-CM

## 2012-10-30 DIAGNOSIS — H43819 Vitreous degeneration, unspecified eye: Secondary | ICD-10-CM

## 2012-10-30 DIAGNOSIS — H2510 Age-related nuclear cataract, unspecified eye: Secondary | ICD-10-CM

## 2012-10-30 DIAGNOSIS — E1065 Type 1 diabetes mellitus with hyperglycemia: Secondary | ICD-10-CM

## 2012-11-05 ENCOUNTER — Ambulatory Visit (INDEPENDENT_AMBULATORY_CARE_PROVIDER_SITE_OTHER): Payer: BC Managed Care – PPO | Admitting: Cardiology

## 2012-11-05 ENCOUNTER — Encounter: Payer: Self-pay | Admitting: Cardiology

## 2012-11-05 VITALS — BP 114/68 | HR 57 | Ht 71.0 in | Wt 190.0 lb

## 2012-11-05 DIAGNOSIS — E785 Hyperlipidemia, unspecified: Secondary | ICD-10-CM

## 2012-11-05 DIAGNOSIS — I2510 Atherosclerotic heart disease of native coronary artery without angina pectoris: Secondary | ICD-10-CM

## 2012-11-05 DIAGNOSIS — I214 Non-ST elevation (NSTEMI) myocardial infarction: Secondary | ICD-10-CM

## 2012-11-05 NOTE — Progress Notes (Signed)
HPI The patient presents for followup of his coronary disease. He is now status post multivessel PCI.  Since I last saw him he's been doing well from a cardiovascular standpoint. He exercises almost daily. He denies any chest pressure, neck or arm discomfort. He's had no palpitations, presyncope or syncope. However, he never really had symptoms prior to his PCI. He did have some trauma to his eye recently. He was having some eye problems even before this but apparently is in the correctional surgery. I have been asked if he can stop his Effient and aspirin.  No Known Allergies  Current Outpatient Prescriptions  Medication Sig Dispense Refill  . ACCU-CHEK AVIVA PLUS test strip 1 each by Other route as needed.       Marland Kitchen albuterol (PROVENTIL HFA;VENTOLIN HFA) 108 (90 BASE) MCG/ACT inhaler Inhale 2 puffs into the lungs every 6 (six) hours as needed.      Marland Kitchen aspirin EC 81 MG EC tablet Take 1 tablet (81 mg total) by mouth daily.      Marland Kitchen atorvastatin (LIPITOR) 40 MG tablet TAKE 2 TABLETS BY MOUTH EVERY DAY  60 tablet  5  . Coenzyme Q-10 100 MG capsule Take 100 mg by mouth daily.      . fenofibrate 160 MG tablet Take 160 mg by mouth daily.      . insulin glargine (LANTUS) 100 UNIT/ML injection Inject 20 Units into the skin at bedtime.       . insulin lispro (HUMALOG) 100 UNIT/ML injection Inject into the skin 3 (three) times daily before meals. Sliding scale. 1 unit per 8 grams of carbs at each meal      . levothyroxine (SYNTHROID, LEVOTHROID) 50 MCG tablet Take 50 mcg by mouth daily.      Marland Kitchen losartan (COZAAR) 25 MG tablet Take 1 tablet (25 mg total) by mouth daily.  30 tablet  11  . nitroGLYCERIN (NITROSTAT) 0.4 MG SL tablet Place 1 tablet (0.4 mg total) under the tongue every 5 (five) minutes x 3 doses as needed for chest pain.  25 tablet  11  . prasugrel (EFFIENT) 10 MG TABS Take 1 tablet (10 mg total) by mouth daily with breakfast.  30 tablet  11    Past Medical History  Diagnosis Date  .  Hypertension   . Hypothyroidism   . Family history of anesthesia complication     nausea   . Coronary artery disease     s/p NSTEMI after ETT-echo - [facility] 7/13:  LM < 10%, pLAD 90%, mLAD 95%, pCFX tandem 90%, mCFX 80-90%, pRCA 30%, mRCA 40-50%;  PCI: Xience DES x 2 to prox and mid LAD; Xience DES x 1 to prox and mid CFX  . Diabetes mellitus     insulin dependent  . HLD (hyperlipidemia)   . Lung nodule     CT 7/13: 3 mm RML nodule - follow up CT in 1 year     Past Surgical History  Procedure Date  . Appendectomy   . Cholecystectomy   . Cardiac catheterization   . Coronary angioplasty     LAD    ROS:  As stated in the HPI and negative for all other systems.   PHYSICAL EXAM BP 114/68  Pulse 57  Ht 5\' 11"  (1.803 m)  Wt 190 lb (86.183 kg)  BMI 26.50 kg/m2 GENERAL:  Well appearing HEENT:  Pupils equal round and reactive, fundi not visualized, oral mucosa unremarkable NECK:  No jugular venous distention, waveform within  normal limits, carotid upstroke brisk and symmetric, no bruits, no thyromegaly LYMPHATICS:  No cervical, inguinal adenopathy LUNGS:  Clear to auscultation bilaterally BACK:  No CVA tenderness CHEST:  Unremarkable HEART:  PMI not displaced or sustained,S1 and S2 within normal limits, no S3, no S4, no clicks, no rubs, no murmurs ABD:  Flat, positive bowel sounds normal in frequency in pitch, no bruits, no rebound, no guarding, no midline pulsatile mass, no hepatomegaly, no splenomegaly EXT:  2 plus pulses throughout, no edema, no cyanosis no clubbing SKIN:  No rashes no nodules NEURO:  Cranial nerves II through XII grossly intact, motor grossly intact throughout PSYCH:  Cognitively intact, oriented to person place and time  EKG:  Sinus bradycardia, rate 57, RSR prime V1 and V2, no acute ST-T wave changes. 11/05/2012  ASSESSMENT AND PLAN   CAD  The patient is participating in secondary risk reduction. He should continue his Effient for a total of 12 months and I  will bring him back in July to discuss stopping this. Because of multiple overlapping drug-eluting stents he probably is at higher risk for thrombosis particularly if he were to stop this and his aspirin. He has been told he needs to stop his medications prior to having eye surgery. However, I called and spoke with his ophthalmologist today. He would not do the procedure even if the patient was only taking aspirin. He says that this procedure could be considered electively at any time going forward. The patient would need to weigh continued risk benefit of having eye surgery which would necessitate stopping the aspirin and Effient.  At this point the patient chooses to continue the medications as they're currently listed.  Hypertension  His blood pressure is running low.  However, he tolerates this.  No change in therapy is indicated.  Hyperlipidemia  His LDL was excellent when it was last tested. According to new guidelines his target dose of Lipitor. We have further discussed the lack of any surrounding the fenofibrate. He will discontinue this medication.

## 2012-11-05 NOTE — Patient Instructions (Addendum)
The current medical regimen is effective;  continue present plan and medications.  Follow up with Dr Antoine Poche in July 2014 for follow up.  You will be contacted to schedule this appointment.

## 2013-02-19 ENCOUNTER — Ambulatory Visit (INDEPENDENT_AMBULATORY_CARE_PROVIDER_SITE_OTHER): Payer: BC Managed Care – PPO | Admitting: Ophthalmology

## 2013-04-22 HISTORY — PX: CORONARY ANGIOPLASTY WITH STENT PLACEMENT: SHX49

## 2013-05-08 ENCOUNTER — Ambulatory Visit (INDEPENDENT_AMBULATORY_CARE_PROVIDER_SITE_OTHER): Payer: Self-pay | Admitting: Cardiology

## 2013-05-08 ENCOUNTER — Encounter: Payer: Self-pay | Admitting: Cardiology

## 2013-05-08 VITALS — BP 132/69 | HR 64 | Ht 71.0 in | Wt 191.4 lb

## 2013-05-08 DIAGNOSIS — R9431 Abnormal electrocardiogram [ECG] [EKG]: Secondary | ICD-10-CM

## 2013-05-08 DIAGNOSIS — I2510 Atherosclerotic heart disease of native coronary artery without angina pectoris: Secondary | ICD-10-CM

## 2013-05-08 NOTE — Progress Notes (Signed)
HPI The patient presents for followup of his coronary disease. He is status post multivessel PCI.  He's been doing well from a cardiovascular standpoint. He exercises almost daily. He denies any chest pressure, neck or arm discomfort. He's had no palpitations, presyncope or syncope. He never really had symptoms prior to his PCI.  He does get some leg cramping. This happens mostly at night. 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  No Known Allergies  Current Outpatient Prescriptions  Medication Sig Dispense Refill  . ACCU-CHEK AVIVA PLUS test strip 1 each by Other route as needed.       Marland Kitchen albuterol (PROVENTIL HFA;VENTOLIN HFA) 108 (90 BASE) MCG/ACT inhaler Inhale 2 puffs into the lungs every 6 (six) hours as needed.      Marland Kitchen aspirin 81 MG tablet Take 81 mg by mouth daily.      Marland Kitchen atorvastatin (LIPITOR) 40 MG tablet TAKE 2 TABLETS BY MOUTH EVERY DAY  60 tablet  5  . fenofibrate 160 MG tablet Take 160 mg by mouth daily.      . folic acid (FOLVITE) 1 MG tablet Take 1 mg by mouth daily.      . insulin glargine (LANTUS) 100 UNIT/ML injection Inject 20 Units into the skin at bedtime.       . insulin lispro (HUMALOG) 100 UNIT/ML injection Inject into the skin 3 (three) times daily before meals. Sliding scale. 1 unit per 8 grams of carbs at each meal      . losartan (COZAAR) 25 MG tablet Take 1 tablet (25 mg total) by mouth daily.  30 tablet  11  . nitroGLYCERIN (NITROSTAT) 0.4 MG SL tablet Place 1 tablet (0.4 mg total) under the tongue every 5 (five) minutes x 3 doses as needed for chest pain.  25 tablet  11  . prasugrel (EFFIENT) 10 MG TABS Take 1 tablet (10 mg total) by mouth daily with breakfast.  30 tablet  11   No current facility-administered medications for this visit.    Past Medical History  Diagnosis Date  . Hypertension   . Hypothyroidism   . Family history of anesthesia complication     nausea   . Coronary artery disease       s/p NSTEMI after ETT-echo - [facility] 7/13:  LM < 10%, pLAD 90%, mLAD 95%, pCFX tandem 90%, mCFX 80-90%, pRCA 30%, mRCA 40-50%;  PCI: Xience DES x 2 to prox and mid LAD; Xience DES x 1 to prox and mid CFX  . Diabetes mellitus     insulin dependent  . HLD (hyperlipidemia)   . Lung nodule     CT 7/13: 3 mm RML nodule - follow up CT in 1 year     Past Surgical History  Procedure Laterality Date  . Appendectomy    . Cholecystectomy    . Cardiac catheterization    . Coronary angioplasty      LAD    ROS:  As stated in the HPI and negative for all other systems.   PHYSICAL EXAM BP 132/69  Pulse 64  Ht 5\' 11"  (1.803 m)  Wt 191 lb 6.4 oz (86.818 kg)  BMI 26.71 kg/m2 GENERAL:  Well appearing HEENT:  Pupils equal round and reactive, fundi not visualized, oral mucosa unremarkable NECK:  No jugular venous distention, waveform within normal limits, carotid upstroke brisk and symmetric, no bruits, no thyromegaly LYMPHATICS:  No  cervical, inguinal adenopathy LUNGS:  Clear to auscultation bilaterally BACK:  No CVA tenderness CHEST:  Unremarkable HEART:  PMI not displaced or sustained,S1 and S2 within normal limits, no S3, no S4, no clicks, no rubs, no murmurs ABD:  Flat, positive bowel sounds normal in frequency in pitch, no bruits, no rebound, no guarding, no midline pulsatile mass, no hepatomegaly, no splenomegaly EXT:  2 plus pulses throughout, mild ankle edema, no cyanosis no clubbing SKIN:  No rashes no nodules NEURO:  Cranial nerves II through XII grossly intact, motor grossly intact throughout PSYCH:  Cognitively intact, oriented to person place and time  EKG:  Sinus bradycardia, rate 64, RSR prime V1 and V2, no acute ST-T wave changes. 05/08/2013  ASSESSMENT AND PLAN   CAD  The patient can now stop the Effient  We again had a discussion about eye surgery and whether he can stop both aspirin and at the end of this. I would prefer that he not do this but having spoken with the surgeon  before he does not think he would be a candidate for the surgery while still on aspirin. Therefore, I have asked him to go back to his ophthalmologist to further discuss this. If this is a vision sparing surgery then he certainly could come off the aspirin. If it was elective I would prefer to continue the aspirin.  I will likely screen him with a treadmill test routinely for followup of what was asymptomatic three-vessel disease.  Hypertension  The blood pressure is at target. No change in medications is indicated. We will continue with therapeutic lifestyle changes (TLC).  Hyperlipidemia  His LDL was excellent when it was last tested. According to new guidelines he is on target dose of Lipitor. He is having some leg cramps. I don't know if this is related to Lipitor. He will start to Q10. He will try other conservative therapies and if this is still a problem we might need to reduce the dose.

## 2013-05-08 NOTE — Patient Instructions (Addendum)
Stop Effient. Continue all other medications as listed.  Follow up in 1 year with Dr Antoine Poche.  You will receive a letter in the mail 2 months before you are due.  Please call us when you receive this letter to schedule your follow up appointment.

## 2013-05-12 ENCOUNTER — Other Ambulatory Visit (HOSPITAL_COMMUNITY): Payer: Self-pay | Admitting: Cardiology

## 2013-05-13 ENCOUNTER — Telehealth: Payer: Self-pay | Admitting: *Deleted

## 2013-05-13 NOTE — Telephone Encounter (Signed)
Pt will call to schedule.

## 2013-05-13 NOTE — Telephone Encounter (Signed)
I received a message for Dr. Saul Fordyce nurse to schedule Mr. Cabana for CTA Scoring on 05/09/13. I have called Mr. Coon times 3 but he has not returned my phone calls. I will forward message to Pam/Dr. Antoine Poche.

## 2013-05-14 ENCOUNTER — Ambulatory Visit (INDEPENDENT_AMBULATORY_CARE_PROVIDER_SITE_OTHER)
Admission: RE | Admit: 2013-05-14 | Discharge: 2013-05-14 | Disposition: A | Discharge status: Home / Self Care | Payer: BC Managed Care – PPO | Source: Ambulatory Visit | Attending: Physician Assistant | Admitting: Physician Assistant

## 2013-05-14 ENCOUNTER — Telehealth: Payer: Self-pay | Admitting: Cardiology

## 2013-05-14 ENCOUNTER — Ambulatory Visit (INDEPENDENT_AMBULATORY_CARE_PROVIDER_SITE_OTHER): Payer: BC Managed Care – PPO | Admitting: Ophthalmology

## 2013-05-14 DIAGNOSIS — R911 Solitary pulmonary nodule: Secondary | ICD-10-CM

## 2013-05-14 DIAGNOSIS — H35039 Hypertensive retinopathy, unspecified eye: Secondary | ICD-10-CM

## 2013-05-14 DIAGNOSIS — E11359 Type 2 diabetes mellitus with proliferative diabetic retinopathy without macular edema: Secondary | ICD-10-CM

## 2013-05-14 DIAGNOSIS — H43819 Vitreous degeneration, unspecified eye: Secondary | ICD-10-CM

## 2013-05-14 DIAGNOSIS — E1039 Type 1 diabetes mellitus with other diabetic ophthalmic complication: Secondary | ICD-10-CM

## 2013-05-14 DIAGNOSIS — H4310 Vitreous hemorrhage, unspecified eye: Secondary | ICD-10-CM

## 2013-05-14 DIAGNOSIS — I2510 Atherosclerotic heart disease of native coronary artery without angina pectoris: Secondary | ICD-10-CM

## 2013-05-14 DIAGNOSIS — I10 Essential (primary) hypertension: Secondary | ICD-10-CM

## 2013-05-14 NOTE — Telephone Encounter (Signed)
New Prob  Pt would like to speak with you regarding a paper he needs filled out.

## 2013-05-14 NOTE — Telephone Encounter (Signed)
Look for paperwork to be filled out - pt brought it to the office today.

## 2013-05-15 ENCOUNTER — Telehealth: Payer: Self-pay | Admitting: Cardiology

## 2013-05-15 NOTE — Telephone Encounter (Signed)
Walk in pt Form " Surgical Clearance" [facility] gave to  [facility] F  05/15/13/KM

## 2013-05-15 NOTE — Telephone Encounter (Signed)
Per Dr Antoine Poche - he needs to speak with surgeon in reference to this and will do so.

## 2013-05-16 ENCOUNTER — Telehealth: Payer: Self-pay | Admitting: Cardiology

## 2013-05-16 ENCOUNTER — Telehealth: Payer: Self-pay | Admitting: *Deleted

## 2013-05-16 IMAGING — CT CT HEART SCORING
1 of 2 series · 11 of 20 positions shown, 14 images · non-contrast
Comparison: none

***ADDENDUM*** CREATED: 05/02/2012 [DATE]

CARDIAC CTA WITH CALCIUM SCORE 05/02/2012 [DATE]
Ordering Physician: [email or web address]
Knut Vidar Physician: [email or web address]
PROTOCOL: The patient scanned on a Siemens sensations 16 slice
scanner.  Gantry rotation speed was 320 milliseconds.  Collimation
was [DATE] mm . Reconstruction overlap was [DATE] mm.
After an initial AP and lateral topogram, 3 mm axial slices were
performed through the heart for calcium scoring.
Indications: CAD risk
DETAILED FINDINGS:
Quality of Study: Fair, there was artifact making evaluation of the
distal RCA difficult.
Coronary Calcium Score: 408.5 Agatston units.  The LAD was heavily
calcified.

[Series 5: thins st · axial · 0.70mm/px · z∈[-189,-108]mm · 11 of 66 slices shown, 14 images]
[im 6/66  vessel]
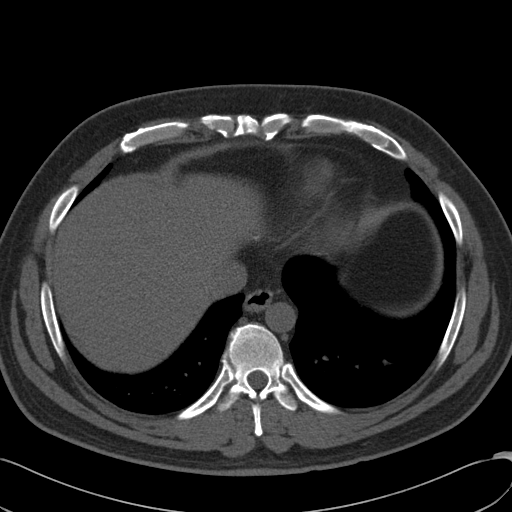
[im 6/66  lung]
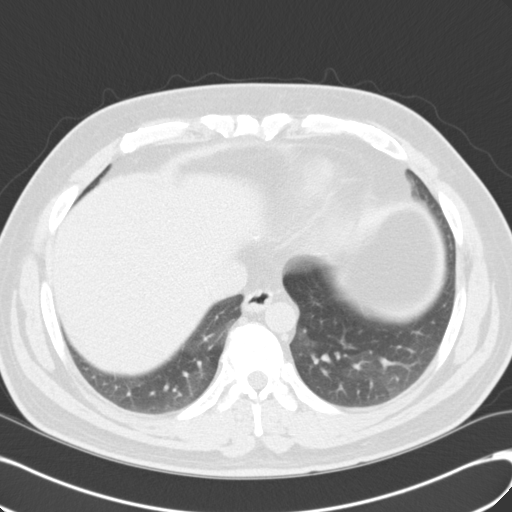
[im 11/66  vessel]
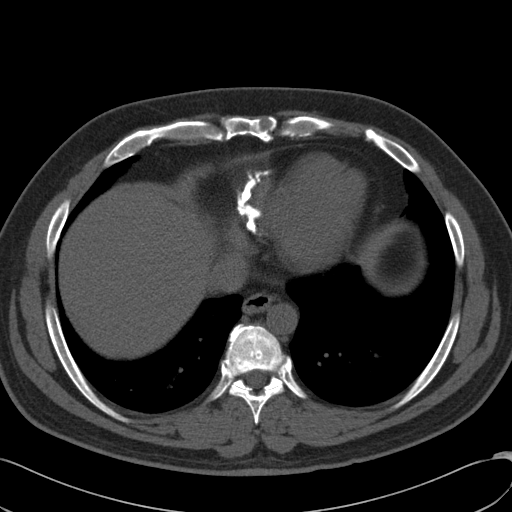
[im 17/66  vessel]
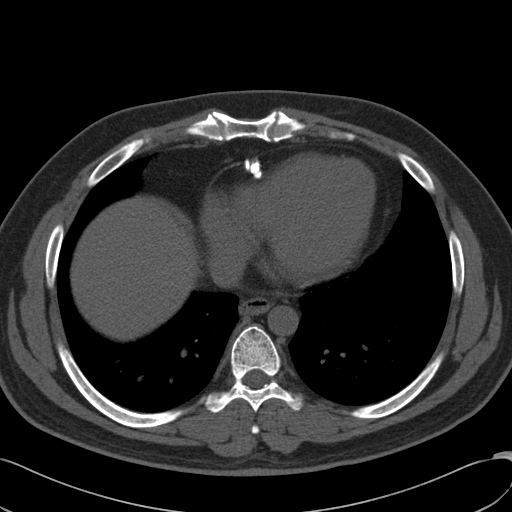
[im 22/66  vessel]
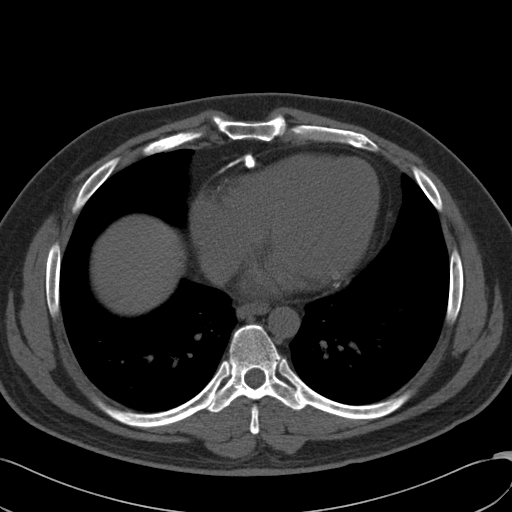
[im 28/66  vessel]
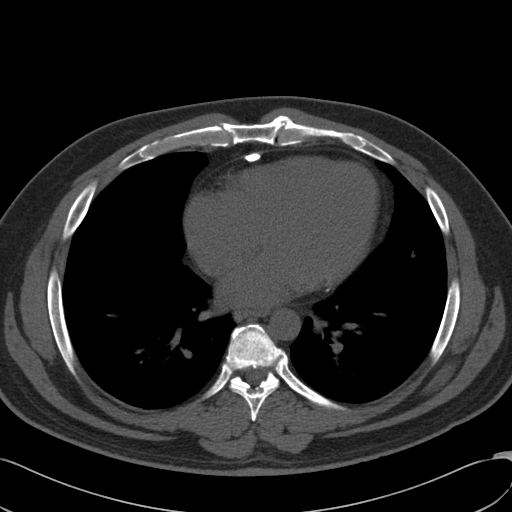
[im 28/66  lung]
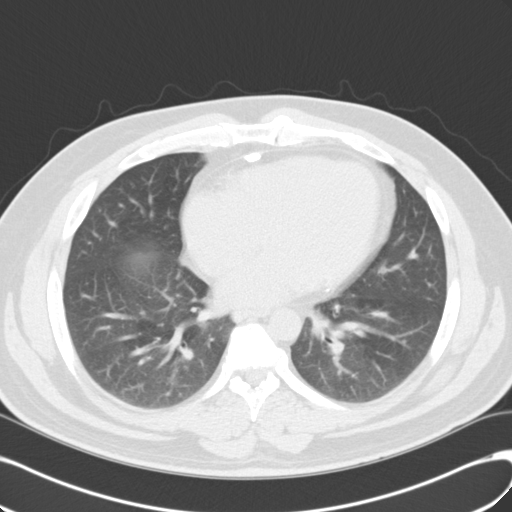
[im 33/66  vessel]
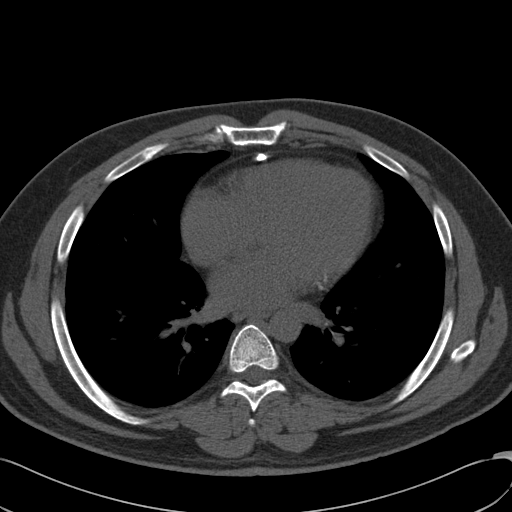
[im 38/66  vessel]
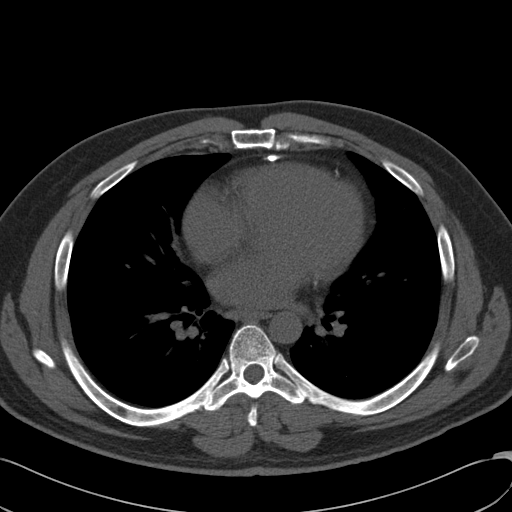
[im 44/66  vessel]
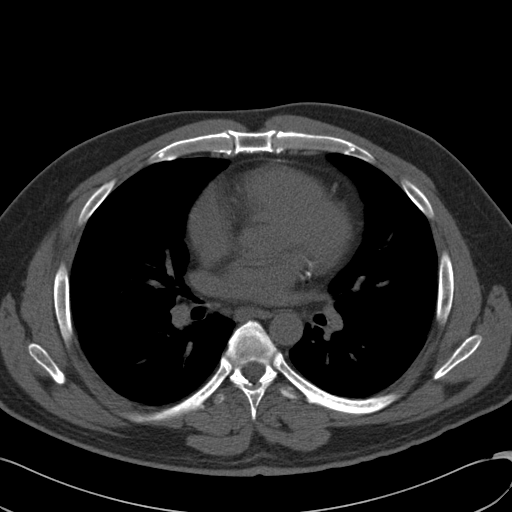
[im 49/66  vessel]
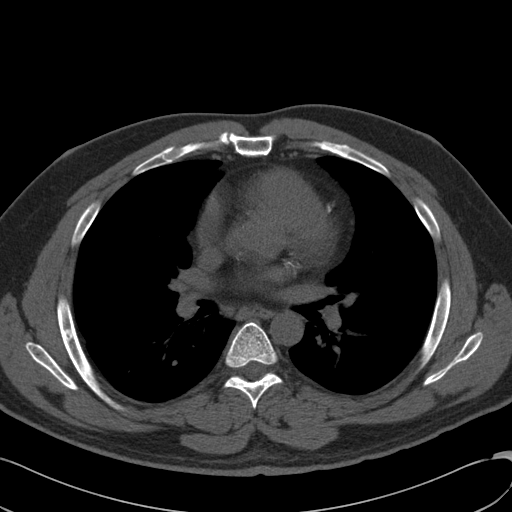
[im 49/66  lung]
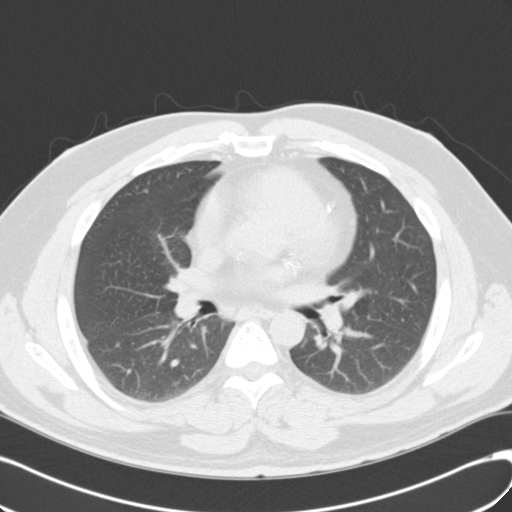
[im 55/66  vessel]
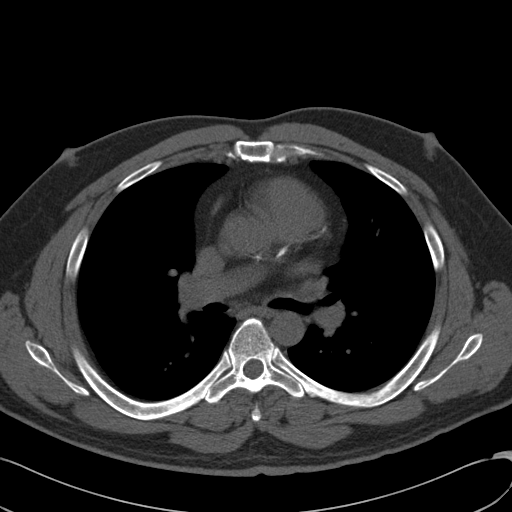
[im 60/66  vessel]
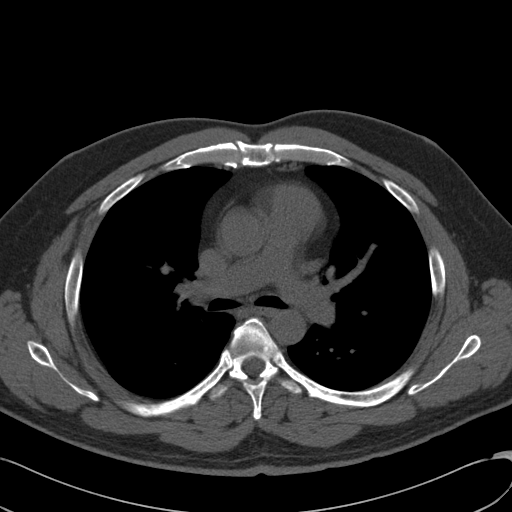

[11 of 20 positions shown; findings below may reference images not displayed]

IMPRESSION: Coronary artery calcium score of 408.5 Agatston units places the
patient in the 97th percentile for his age and gender, suggesting
high risk of future cardiac events.  A coronary artery calcium
score > 400 Agatston units suggests elevated risk of obstructive
CAD, stress testing could be considered.

***END ADDENDUM*** SIGNED BY: Lilah Handy
OVER-READ INTERPRETATION - CT CHEST

The following report is an over-read performed by radiologist Dr.
[DATE].  This over-read does not include interpretation of
cardiac or coronary anatomy or pathology.  The coronary calcium
score interpretation by the cardiologist is attached.
FINDINGS: Lung windows demonstrate 3 mm subpleural right middle
lobe lung nodule on image 17 of series 4.

Mild nonspecific mosaic attenuation within the lung bases.

Soft tissue windows demonstrate no imaged thoracic adenopathy,
given limitations of unenhanced CT.  Air fluid level within the
thoracic esophagus on image 26 of series 3.

Normal thoracic aortic caliber.

Pericardial calcifications, including along the undersurface of the
heart on image 53 of series 5. No pericardial or pleural effusion.

Limited abdominal imaging demonstrates no significant findings.
No acute osseous abnormality.

IMPRESSION

1.  Extracardiac findings pertinent for a 3 mm right middle lobe
lung nodule.  Favor a subpleural lymph node.  If the patient is at
high risk for bronchogenic carcinoma, follow-up chest CT at 1 year
is recommended.  If the patient is at low risk, no follow-up is
needed.   This recommendation follows the consensus statement:
"Guidelines for Management of Small Pulmonary Nodules Detected on
CT Scans:  A Statement from the [HOSPITAL]" as published in
[URL]
2.  Pericardial calcifications, suggesting prior pericardial
effusion.
3. Esophageal air fluid level suggests dysmotility or
gastroesophageal reflux.

## 2013-05-16 NOTE — Telephone Encounter (Signed)
Returned call to patient chest ct results given.Copy faxed to Dr.Whyte.

## 2013-05-16 NOTE — Telephone Encounter (Signed)
I left a message on VM for the patient.  I was not able to talk to the eye doctor.  I would prefer to leave the patient on ASA.  But having spoken with the surgeon before he does not think he would be a candidate for the surgery while still on aspirin. Therefore, If this is a vision sparing surgery then he certainly could come off the aspirin. If it was elective I would prefer to continue the aspirin.

## 2013-05-16 NOTE — Telephone Encounter (Signed)
Follow Up    Following on results from scan. Please call.

## 2013-05-16 NOTE — Telephone Encounter (Signed)
was going to call results to pt however Fernand Parkins, RN already called pt. I will not duplicate call

## 2013-05-29 ENCOUNTER — Other Ambulatory Visit: Payer: Self-pay | Admitting: Physician Assistant

## 2013-06-20 ENCOUNTER — Telehealth: Payer: Self-pay | Admitting: Cardiology

## 2013-06-20 NOTE — Telephone Encounter (Signed)
New problem   Pt stated Dr Antoine Poche called him 3wks ago and he is now just returning his call. It was concerning pt's eye surgery.

## 2013-06-20 NOTE — Telephone Encounter (Signed)
Unsure of why Dr Antoine Poche called pt and what he wanted to discuss with him.  Will forward to MD for him to call pt and document conversation.

## 2013-08-26 ENCOUNTER — Telehealth: Payer: Self-pay | Admitting: Cardiology

## 2013-08-26 NOTE — Telephone Encounter (Signed)
New Problem    Pt needs a call back about his Optometrist Dr. Ashley Royalty eye Dr..   Needs information on the status of this.

## 2013-08-26 NOTE — Telephone Encounter (Signed)
Will forward to Dr Antoine Poche to call pt back to discuss

## 2013-08-27 NOTE — Telephone Encounter (Signed)
Dr Hochrein spoke with pt 

## 2013-08-27 NOTE — Telephone Encounter (Signed)
I spoke with patient today.he certainly should come off the aspirin for any ophthalmologic procedure that requires this if this procedure will improve his vision warfarin any further vision loss.  He will discuss this with his ophthalmologist.

## 2013-09-02 ENCOUNTER — Telehealth: Payer: Self-pay | Admitting: Cardiology

## 2013-09-02 NOTE — Telephone Encounter (Signed)
New Problem:  Pt states Dr. Antoine Poche received a surgical clearance form from [facility], Dr. Alan Mulder... Pt is calling to check on the status. Please advise

## 2013-09-02 NOTE — Telephone Encounter (Signed)
Spoke with patient and advised that Dr.Hochrein has not received a form to complete concerning impending eye surgery. I provided the fax number and asked him to call the eye center to have the form faxed to [place] tomorrow.

## 2013-09-04 NOTE — Telephone Encounter (Signed)
Paperwork completed and given to MR to fax 

## 2013-09-09 ENCOUNTER — Telehealth: Payer: Self-pay | Admitting: Cardiology

## 2013-09-09 NOTE — Telephone Encounter (Signed)
New Problem:  Pt states he is following up on the status of his eye surgery clearance form. Please advise

## 2013-09-09 NOTE — Telephone Encounter (Signed)
Pt aware paperwork had been faxed already.    Paperwork as located and re-faxed

## 2013-09-09 NOTE — H&P (Signed)
[name] is an 47 y.o. male.   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 Hypertension   . Hypothyroidism   . Family history of anesthesia complication     nausea   . Coronary artery disease     s/p NSTEMI after ETT-echo - [facility] 7/13:  LM < 10%, pLAD 90%, mLAD 95%, pCFX tandem 90%, mCFX 80-90%, pRCA 30%, mRCA 40-50%;  PCI: Xience DES x 2 to prox and mid LAD; Xience DES x 1 to prox and mid CFX  . Diabetes mellitus     insulin dependent  . HLD (hyperlipidemia)   . Lung nodule     CT 7/13: 3 mm RML nodule - follow up CT in 1 year     Past Surgical History  Procedure Laterality Date  . Appendectomy    . Cholecystectomy    . Cardiac catheterization    . Coronary angioplasty      LAD    Family History  Problem Relation Age of Onset  . Retinitis pigmentosa Father   . Coronary artery disease Brother 62    Died  . Heart attack Brother   . Heart disease Brother   . Coronary artery disease Maternal Uncle 58    Died  . Coronary artery disease Maternal Grandmother 77    Died  . Aneurysm Maternal Grandfather 85    Abdominal  . Aneurysm Daughter 16    Cerebral  . Hypertension Mother    Social History:  reports that he has never smoked. He has never used smokeless tobacco. He reports that he does not drink alcohol or use illicit drugs.  Allergies: No Known Allergies  No prescriptions prior to admission    Review of systems otherwise negative  There were no vitals taken for this visit.  Physical exam: Mental status: oriented x3. Eyes: See eye exam associated with this date of surgery in media tab.  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  Right eye  Sherrie George 09/09/2013, 2:37 PM

## 2013-09-12 ENCOUNTER — Encounter (HOSPITAL_COMMUNITY): Payer: Self-pay | Admitting: Pharmacy Technician

## 2013-09-15 ENCOUNTER — Encounter (HOSPITAL_COMMUNITY): Payer: Self-pay | Admitting: *Deleted

## 2013-09-15 ENCOUNTER — Other Ambulatory Visit (HOSPITAL_COMMUNITY): Payer: Self-pay | Admitting: *Deleted

## 2013-09-15 MED ORDER — CYCLOPENTOLATE HCL 1 % OP SOLN
1.0000 [drp] | OPHTHALMIC | Status: AC | PRN
  Administered 2013-09-16 (×3): 1 [drp] via OPHTHALMIC
  Filled 2013-09-15: qty 2

## 2013-09-15 MED ORDER — GATIFLOXACIN 0.5 % OP SOLN
1.0000 [drp] | OPHTHALMIC | Status: AC | PRN
  Administered 2013-09-16 (×3): 1 [drp] via OPHTHALMIC
  Filled 2013-09-15: qty 2.5

## 2013-09-15 MED ORDER — MUPIROCIN 2 % EX OINT
TOPICAL_OINTMENT | Freq: Two times a day (BID) | CUTANEOUS | Status: DC
  Administered 2013-09-16: 10:00:00 via NASAL
  Filled 2013-09-15: qty 22

## 2013-09-15 MED ORDER — PHENYLEPHRINE HCL 2.5 % OP SOLN
1.0000 [drp] | OPHTHALMIC | Status: AC | PRN
  Administered 2013-09-16 (×3): 1 [drp] via OPHTHALMIC

## 2013-09-15 MED ORDER — TROPICAMIDE 1 % OP SOLN
1.0000 [drp] | OPHTHALMIC | Status: AC | PRN
  Administered 2013-09-16 (×3): 1 [drp] via OPHTHALMIC
  Filled 2013-09-15: qty 3

## 2013-09-15 MED ORDER — CEFAZOLIN SODIUM-DEXTROSE 2-3 GM-% IV SOLR
2.0000 g | INTRAVENOUS | Status: AC
  Administered 2013-09-16: 2 g via INTRAVENOUS
  Filled 2013-09-15: qty 50

## 2013-09-15 NOTE — Progress Notes (Signed)
Spoke with Smith Mince, Diabetes Coordinator about pt's insulin pump. She states pt is to wear insulin pump to hospital and keep it in while in surgery as long as surgery doesn't last much longer than an hour. She will call and talk with his nurse in the am to see how things are going. She also suggested that pt bring his own supplies with him in case he needs to change the site. I called the patient back and informed him of this information. He voiced understanding.

## 2013-09-16 ENCOUNTER — Encounter (HOSPITAL_COMMUNITY)
Admission: RE | Disposition: A | Discharge status: Home / Self Care | Payer: Self-pay | Source: Ambulatory Visit | Attending: Ophthalmology

## 2013-09-16 ENCOUNTER — Ambulatory Visit (HOSPITAL_COMMUNITY)
Admission: RE | Admit: 2013-09-16 | Discharge: 2013-09-17 | Disposition: A | Discharge status: Home / Self Care | Payer: BC Managed Care – PPO | Source: Ambulatory Visit | Attending: Ophthalmology | Admitting: Ophthalmology

## 2013-09-16 ENCOUNTER — Ambulatory Visit (HOSPITAL_COMMUNITY): Payer: BC Managed Care – PPO

## 2013-09-16 ENCOUNTER — Ambulatory Visit (HOSPITAL_COMMUNITY): Payer: BC Managed Care – PPO | Admitting: Anesthesiology

## 2013-09-16 ENCOUNTER — Encounter (HOSPITAL_COMMUNITY): Payer: BC Managed Care – PPO | Admitting: Anesthesiology

## 2013-09-16 ENCOUNTER — Encounter (INDEPENDENT_AMBULATORY_CARE_PROVIDER_SITE_OTHER): Payer: BC Managed Care – PPO | Admitting: Ophthalmology

## 2013-09-16 ENCOUNTER — Encounter (HOSPITAL_COMMUNITY): Payer: Self-pay | Admitting: *Deleted

## 2013-09-16 DIAGNOSIS — E1039 Type 1 diabetes mellitus with other diabetic ophthalmic complication: Secondary | ICD-10-CM

## 2013-09-16 DIAGNOSIS — I252 Old myocardial infarction: Secondary | ICD-10-CM | POA: Insufficient documentation

## 2013-09-16 DIAGNOSIS — H35039 Hypertensive retinopathy, unspecified eye: Secondary | ICD-10-CM

## 2013-09-16 DIAGNOSIS — I10 Essential (primary) hypertension: Secondary | ICD-10-CM

## 2013-09-16 DIAGNOSIS — H4310 Vitreous hemorrhage, unspecified eye: Secondary | ICD-10-CM | POA: Insufficient documentation

## 2013-09-16 DIAGNOSIS — I2510 Atherosclerotic heart disease of native coronary artery without angina pectoris: Secondary | ICD-10-CM | POA: Insufficient documentation

## 2013-09-16 DIAGNOSIS — H2510 Age-related nuclear cataract, unspecified eye: Secondary | ICD-10-CM

## 2013-09-16 DIAGNOSIS — H43819 Vitreous degeneration, unspecified eye: Secondary | ICD-10-CM

## 2013-09-16 DIAGNOSIS — E11359 Type 2 diabetes mellitus with proliferative diabetic retinopathy without macular edema: Secondary | ICD-10-CM

## 2013-09-16 DIAGNOSIS — Z794 Long term (current) use of insulin: Secondary | ICD-10-CM | POA: Insufficient documentation

## 2013-09-16 DIAGNOSIS — E113599 Type 2 diabetes mellitus with proliferative diabetic retinopathy without macular edema, unspecified eye: Secondary | ICD-10-CM

## 2013-09-16 DIAGNOSIS — E1065 Type 1 diabetes mellitus with hyperglycemia: Secondary | ICD-10-CM

## 2013-09-16 DIAGNOSIS — E785 Hyperlipidemia, unspecified: Secondary | ICD-10-CM | POA: Insufficient documentation

## 2013-09-16 DIAGNOSIS — E039 Hypothyroidism, unspecified: Secondary | ICD-10-CM | POA: Insufficient documentation

## 2013-09-16 DIAGNOSIS — E1139 Type 2 diabetes mellitus with other diabetic ophthalmic complication: Secondary | ICD-10-CM | POA: Insufficient documentation

## 2013-09-16 HISTORY — PX: PHOTOCOAGULATION WITH LASER: SHX6027

## 2013-09-16 HISTORY — PX: GAS/FLUID EXCHANGE: SHX5334

## 2013-09-16 HISTORY — DX: Type 1 diabetes mellitus without complications: E10.9

## 2013-09-16 HISTORY — PX: MEMBRANE PEEL: SHX5967

## 2013-09-16 HISTORY — PX: PARS PLANA VITRECTOMY: SHX2166

## 2013-09-16 LAB — GLUCOSE, CAPILLARY
Glucose-Capillary: 156 mg/dL — ABNORMAL HIGH (ref 70–99)
Glucose-Capillary: 157 mg/dL — ABNORMAL HIGH (ref 70–99)
Glucose-Capillary: 163 mg/dL — ABNORMAL HIGH (ref 70–99)
Glucose-Capillary: 165 mg/dL — ABNORMAL HIGH (ref 70–99)
Glucose-Capillary: 177 mg/dL — ABNORMAL HIGH (ref 70–99)
Glucose-Capillary: 183 mg/dL — ABNORMAL HIGH (ref 70–99)
Glucose-Capillary: 192 mg/dL — ABNORMAL HIGH (ref 70–99)

## 2013-09-16 LAB — BASIC METABOLIC PANEL
CO2: 24 mEq/L (ref 19–32)
Calcium: 9.3 mg/dL (ref 8.4–10.5)
Chloride: 102 mEq/L (ref 96–112)
GFR calc Af Amer: >90 mL/min (ref >90)
Glucose, Bld: 187 mg/dL — ABNORMAL HIGH (ref 70–99)
Potassium: 4.7 mEq/L (ref 3.5–5.1)
Sodium: 136 mEq/L (ref 135–145)

## 2013-09-16 LAB — CBC
HCT: 42 % (ref 39.0–52.0)
Hemoglobin: 14.5 g/dL (ref 13.0–17.0)
MCV: 92.3 fL (ref 78.0–100.0)
Platelets: 261 10*3/uL (ref 150–400)
RBC: 4.55 MIL/uL (ref 4.22–5.81)
WBC: 6.5 10*3/uL (ref 4.0–10.5)

## 2013-09-16 SURGERY — PARS PLANA VITRECTOMY WITH 25 GAUGE
Anesthesia: General | Site: Eye | Laterality: Right | Wound class: Clean

## 2013-09-16 MED ORDER — GATIFLOXACIN 0.5 % OP SOLN
1.0000 [drp] | Freq: Four times a day (QID) | OPHTHALMIC | Status: DC
  Filled 2013-09-16: qty 2.5

## 2013-09-16 MED ORDER — ONDANSETRON HCL 4 MG/2ML IJ SOLN
INTRAMUSCULAR | Status: DC | PRN
  Administered 2013-09-16: 4 mg via INTRAVENOUS

## 2013-09-16 MED ORDER — GENTAMICIN SULFATE 40 MG/ML IJ SOLN
INTRAMUSCULAR | Status: AC
  Filled 2013-09-16: qty 2

## 2013-09-16 MED ORDER — COENZYME Q10 200 MG PO CAPS: 200.0000 mg | ORAL_CAPSULE | Freq: Every day | ORAL | Status: DC

## 2013-09-16 MED ORDER — SODIUM CHLORIDE 0.9 % IJ SOLN
INTRAMUSCULAR | Status: AC
  Filled 2013-09-16: qty 10

## 2013-09-16 MED ORDER — DOCUSATE SODIUM 100 MG PO CAPS
100.0000 mg | ORAL_CAPSULE | Freq: Two times a day (BID) | ORAL | Status: DC
  Administered 2013-09-16: 100 mg via ORAL
  Filled 2013-09-16: qty 1

## 2013-09-16 MED ORDER — PROMETHAZINE HCL 25 MG/ML IJ SOLN: 6.2500 mg | INTRAMUSCULAR | Status: DC | PRN

## 2013-09-16 MED ORDER — BRIMONIDINE TARTRATE 0.2 % OP SOLN
1.0000 [drp] | Freq: Two times a day (BID) | OPHTHALMIC | Status: DC
  Filled 2013-09-16: qty 5

## 2013-09-16 MED ORDER — BSS IO SOLN
INTRAOCULAR | Status: AC
  Filled 2013-09-16: qty 15

## 2013-09-16 MED ORDER — CHLORHEXIDINE GLUCONATE CLOTH 2 % EX PADS
6.0000 | MEDICATED_PAD | Freq: Every day | CUTANEOUS | Status: DC
  Administered 2013-09-17: 6 via TOPICAL

## 2013-09-16 MED ORDER — HYDROCODONE-ACETAMINOPHEN 5-325 MG PO TABS: 1.0000 | ORAL_TABLET | ORAL | Status: DC | PRN

## 2013-09-16 MED ORDER — MIDAZOLAM HCL 5 MG/5ML IJ SOLN
INTRAMUSCULAR | Status: DC | PRN
  Administered 2013-09-16: 2 mg via INTRAVENOUS

## 2013-09-16 MED ORDER — ALBUTEROL SULFATE HFA 108 (90 BASE) MCG/ACT IN AERS
2.0000 | INHALATION_SPRAY | RESPIRATORY_TRACT | Status: DC | PRN
  Filled 2013-09-16: qty 6.7

## 2013-09-16 MED ORDER — ACETAZOLAMIDE SODIUM 500 MG IJ SOLR
500.0000 mg | Freq: Once | INTRAMUSCULAR | Status: AC
  Administered 2013-09-17: 500 mg via INTRAVENOUS
  Filled 2013-09-16: qty 500

## 2013-09-16 MED ORDER — MAGNESIUM HYDROXIDE 400 MG/5ML PO SUSP: 15.0000 mL | Freq: Four times a day (QID) | ORAL | Status: DC | PRN

## 2013-09-16 MED ORDER — BUPIVACAINE-EPINEPHRINE (PF) 0.25% -1:200000 IJ SOLN
INTRAMUSCULAR | Status: AC
  Filled 2013-09-16: qty 30

## 2013-09-16 MED ORDER — MORPHINE SULFATE 2 MG/ML IJ SOLN: 1.0000 mg | INTRAMUSCULAR | Status: DC | PRN

## 2013-09-16 MED ORDER — PREDNISOLONE ACETATE 1 % OP SUSP
1.0000 [drp] | Freq: Four times a day (QID) | OPHTHALMIC | Status: DC
  Filled 2013-09-16: qty 1

## 2013-09-16 MED ORDER — DEXAMETHASONE SODIUM PHOSPHATE 10 MG/ML IJ SOLN
INTRAMUSCULAR | Status: DC | PRN
  Administered 2013-09-16: 10 mg

## 2013-09-16 MED ORDER — DEXAMETHASONE SODIUM PHOSPHATE 10 MG/ML IJ SOLN
INTRAMUSCULAR | Status: AC
  Filled 2013-09-16: qty 1

## 2013-09-16 MED ORDER — GLYCOPYRROLATE 0.2 MG/ML IJ SOLN
INTRAMUSCULAR | Status: DC | PRN
  Administered 2013-09-16: .6 mg via INTRAVENOUS

## 2013-09-16 MED ORDER — LIDOCAINE HCL 2 % IJ SOLN
INTRAMUSCULAR | Status: AC
  Filled 2013-09-16: qty 20

## 2013-09-16 MED ORDER — OXYCODONE HCL 5 MG/5ML PO SOLN: 5.0000 mg | Freq: Once | ORAL | Status: AC | PRN

## 2013-09-16 MED ORDER — BACITRACIN-POLYMYXIN B 500-10000 UNIT/GM OP OINT
TOPICAL_OINTMENT | OPHTHALMIC | Status: DC | PRN
  Administered 2013-09-16: 1 via OPHTHALMIC

## 2013-09-16 MED ORDER — ROCURONIUM BROMIDE 100 MG/10ML IV SOLN
INTRAVENOUS | Status: DC | PRN
  Administered 2013-09-16: 40 mg via INTRAVENOUS

## 2013-09-16 MED ORDER — ATORVASTATIN CALCIUM 20 MG PO TABS
20.0000 mg | ORAL_TABLET | Freq: Every day | ORAL | Status: DC
  Filled 2013-09-16 (×2): qty 1

## 2013-09-16 MED ORDER — TRIAMCINOLONE ACETONIDE 40 MG/ML IJ SUSP
INTRAMUSCULAR | Status: AC
  Filled 2013-09-16: qty 5

## 2013-09-16 MED ORDER — NEOSTIGMINE METHYLSULFATE 1 MG/ML IJ SOLN
INTRAMUSCULAR | Status: DC | PRN
  Administered 2013-09-16: 3 mg via INTRAVENOUS

## 2013-09-16 MED ORDER — BACITRACIN-POLYMYXIN B 500-10000 UNIT/GM OP OINT
1.0000 "application " | TOPICAL_OINTMENT | Freq: Four times a day (QID) | OPHTHALMIC | Status: DC
  Filled 2013-09-16: qty 3.5

## 2013-09-16 MED ORDER — SODIUM CHLORIDE 0.45 % IV SOLN
INTRAVENOUS | Status: DC
  Administered 2013-09-16: 16:00:00 via INTRAVENOUS

## 2013-09-16 MED ORDER — LIDOCAINE HCL 4 % MT SOLN
OROMUCOSAL | Status: DC | PRN
  Administered 2013-09-16: 4 mL via TOPICAL

## 2013-09-16 MED ORDER — HEMOSTATIC AGENTS (NO CHARGE) OPTIME
TOPICAL | Status: DC | PRN
  Administered 2013-09-16: 1 via TOPICAL

## 2013-09-16 MED ORDER — ATROPINE SULFATE 1 % OP SOLN
OPHTHALMIC | Status: AC
  Filled 2013-09-16: qty 2

## 2013-09-16 MED ORDER — SODIUM HYALURONATE 10 MG/ML IO SOLN
INTRAOCULAR | Status: DC | PRN
  Administered 2013-09-16: 0.85 mL via INTRAOCULAR

## 2013-09-16 MED ORDER — HYPROMELLOSE (GONIOSCOPIC) 2.5 % OP SOLN
OPHTHALMIC | Status: AC
  Filled 2013-09-16: qty 15

## 2013-09-16 MED ORDER — EPINEPHRINE HCL 1 MG/ML IJ SOLN
INTRAOCULAR | Status: DC | PRN
  Administered 2013-09-16: 14:00:00

## 2013-09-16 MED ORDER — TETRACAINE HCL 0.5 % OP SOLN
2.0000 [drp] | Freq: Once | OPHTHALMIC | Status: DC
  Filled 2013-09-16: qty 2

## 2013-09-16 MED ORDER — MUPIROCIN 2 % EX OINT
TOPICAL_OINTMENT | Freq: Two times a day (BID) | CUTANEOUS | Status: DC
  Administered 2013-09-16: 1 via NASAL
  Filled 2013-09-16: qty 22

## 2013-09-16 MED ORDER — SODIUM CHLORIDE 0.9 % IV SOLN
INTRAVENOUS | Status: DC
  Administered 2013-09-16 (×2): via INTRAVENOUS

## 2013-09-16 MED ORDER — PROPOFOL 10 MG/ML IV BOLUS
INTRAVENOUS | Status: DC | PRN
  Administered 2013-09-16: 200 mg via INTRAVENOUS

## 2013-09-16 MED ORDER — BACITRACIN-POLYMYXIN B 500-10000 UNIT/GM OP OINT
TOPICAL_OINTMENT | OPHTHALMIC | Status: AC
  Filled 2013-09-16: qty 3.5

## 2013-09-16 MED ORDER — BUPIVACAINE HCL (PF) 0.75 % IJ SOLN
INTRAMUSCULAR | Status: AC
  Filled 2013-09-16: qty 10

## 2013-09-16 MED ORDER — LATANOPROST 0.005 % OP SOLN
1.0000 [drp] | Freq: Every day | OPHTHALMIC | Status: DC
  Filled 2013-09-16: qty 2.5

## 2013-09-16 MED ORDER — LOSARTAN POTASSIUM 25 MG PO TABS
25.0000 mg | ORAL_TABLET | Freq: Every day | ORAL | Status: DC
  Filled 2013-09-16 (×2): qty 1

## 2013-09-16 MED ORDER — FENTANYL CITRATE 0.05 MG/ML IJ SOLN
INTRAMUSCULAR | Status: DC | PRN
  Administered 2013-09-16: 100 ug via INTRAVENOUS
  Administered 2013-09-16 (×3): 50 ug via INTRAVENOUS

## 2013-09-16 MED ORDER — ATROPINE SULFATE 1 % OP SOLN
OPHTHALMIC | Status: DC | PRN
  Administered 2013-09-16: 2 [drp] via OPHTHALMIC

## 2013-09-16 MED ORDER — INSULIN PUMP
SUBCUTANEOUS | Status: DC
  Filled 2013-09-16: qty 1

## 2013-09-16 MED ORDER — HYDROMORPHONE HCL PF 1 MG/ML IJ SOLN: 0.2500 mg | INTRAMUSCULAR | Status: DC | PRN

## 2013-09-16 MED ORDER — POLYMYXIN B SULFATE 500000 UNITS IJ SOLR
INTRAMUSCULAR | Status: AC
  Filled 2013-09-16: qty 1

## 2013-09-16 MED ORDER — SODIUM CHLORIDE 0.9 % IJ SOLN
INTRAMUSCULAR | Status: DC | PRN
  Administered 2013-09-16: 14:00:00

## 2013-09-16 MED ORDER — PHENYLEPHRINE HCL 2.5 % OP SOLN
OPHTHALMIC | Status: AC
  Filled 2013-09-16: qty 15

## 2013-09-16 MED ORDER — INSULIN ASPART 100 UNIT/ML ~~LOC~~ SOLN
0.0000 [IU] | SUBCUTANEOUS | Status: DC
  Administered 2013-09-16: 3.5 [IU] via SUBCUTANEOUS
  Administered 2013-09-16: 9.6 [IU] via SUBCUTANEOUS
  Administered 2013-09-17: 1.6 [IU] via SUBCUTANEOUS
  Administered 2013-09-17: 0.7 [IU] via SUBCUTANEOUS

## 2013-09-16 MED ORDER — BUPIVACAINE HCL (PF) 0.75 % IJ SOLN
INTRAMUSCULAR | Status: DC | PRN
  Administered 2013-09-16: 10 mL

## 2013-09-16 MED ORDER — ALBUTEROL SULFATE HFA 108 (90 BASE) MCG/ACT IN AERS
2.0000 | INHALATION_SPRAY | Freq: Four times a day (QID) | RESPIRATORY_TRACT | Status: DC
  Filled 2013-09-16: qty 6.7

## 2013-09-16 MED ORDER — TEMAZEPAM 15 MG PO CAPS: 15.0000 mg | ORAL_CAPSULE | Freq: Every evening | ORAL | Status: DC | PRN

## 2013-09-16 MED ORDER — ONDANSETRON HCL 4 MG/2ML IJ SOLN: 4.0000 mg | Freq: Four times a day (QID) | INTRAMUSCULAR | Status: DC | PRN

## 2013-09-16 MED ORDER — ACETAZOLAMIDE SODIUM 500 MG IJ SOLR
INTRAMUSCULAR | Status: AC
  Filled 2013-09-16: qty 500

## 2013-09-16 MED ORDER — EPINEPHRINE HCL 1 MG/ML IJ SOLN
INTRAMUSCULAR | Status: AC
  Filled 2013-09-16: qty 1

## 2013-09-16 MED ORDER — NITROGLYCERIN 0.4 MG SL SUBL: 0.4000 mg | SUBLINGUAL_TABLET | SUBLINGUAL | Status: DC | PRN

## 2013-09-16 MED ORDER — OXYCODONE HCL 5 MG PO TABS: 5.0000 mg | ORAL_TABLET | Freq: Once | ORAL | Status: AC | PRN

## 2013-09-16 MED ORDER — ACETAMINOPHEN 325 MG PO TABS: 325.0000 mg | ORAL_TABLET | ORAL | Status: DC | PRN

## 2013-09-16 MED ORDER — BSS PLUS IO SOLN
INTRAOCULAR | Status: AC
  Filled 2013-09-16: qty 500

## 2013-09-16 MED ORDER — HYALURONIDASE HUMAN 150 UNIT/ML IJ SOLN
INTRAMUSCULAR | Status: AC
  Filled 2013-09-16: qty 1

## 2013-09-16 MED ORDER — SODIUM HYALURONATE 10 MG/ML IO SOLN
INTRAOCULAR | Status: AC
  Filled 2013-09-16: qty 0.85

## 2013-09-16 MED ORDER — MINERAL OIL LIGHT 100 % EX OIL
TOPICAL_OIL | CUTANEOUS | Status: AC
  Filled 2013-09-16: qty 25

## 2013-09-16 SURGICAL SUPPLY — 69 items
APPLICATOR DR MATTHEWS STRL (MISCELLANEOUS) IMPLANT
BLADE EYE CATARACT 19 1.4 BEAV (BLADE) IMPLANT
BLADE MVR KNIFE 19G (BLADE) IMPLANT
BLADE MVR KNIFE 20G (BLADE) IMPLANT
CANNULA DUAL BORE 23G (CANNULA) IMPLANT
CANNULA VLV SOFT TIP 25GA (OPHTHALMIC) ×2 IMPLANT
CLOTH BEACON ORANGE TIMEOUT ST (SAFETY) IMPLANT
CORDS BIPOLAR (ELECTRODE) ×2 IMPLANT
COTTONBALL LRG STERILE PKG (GAUZE/BANDAGES/DRESSINGS) ×6 IMPLANT
COVER MAYO STAND STRL (DRAPES) ×2 IMPLANT
DRAPE INCISE 51X51 W/FILM STRL (DRAPES) ×2 IMPLANT
DRAPE OPHTHALMIC 77X100 STRL (CUSTOM PROCEDURE TRAY) ×2 IMPLANT
FILTER BLUE MILLIPORE (MISCELLANEOUS) IMPLANT
FILTER STRAW FLUID ASPIR (MISCELLANEOUS) IMPLANT
FORCEPS ECKARDT ILM 25G SERR (OPHTHALMIC RELATED) IMPLANT
FORCEPS GRIESHABER ILM 25G A (INSTRUMENTS) IMPLANT
GLOVE SS BIOGEL STRL SZ 6.5 (GLOVE) ×1 IMPLANT
GLOVE SS BIOGEL STRL SZ 7 (GLOVE) ×1 IMPLANT
GLOVE SUPERSENSE BIOGEL SZ 6.5 (GLOVE) ×1
GLOVE SUPERSENSE BIOGEL SZ 7 (GLOVE) ×1
GLOVE SURG 8.5 LATEX PF (GLOVE) ×2 IMPLANT
GLOVE SURG SS PI 7.0 STRL IVOR (GLOVE) ×2 IMPLANT
GOWN ISOLATION IMPERV (GOWNS) ×2 IMPLANT
GOWN STRL NON-REIN LRG LVL3 (GOWN DISPOSABLE) ×6 IMPLANT
HANDLE PNEUMATIC FOR CONSTEL (OPHTHALMIC) IMPLANT
KIT BASIN OR (CUSTOM PROCEDURE TRAY) ×2 IMPLANT
KNIFE CRESCENT 2.5 55 ANG (BLADE) IMPLANT
MASK EYE SHIELD (GAUZE/BANDAGES/DRESSINGS) ×2 IMPLANT
MICROPICK 25G (MISCELLANEOUS)
NEEDLE 18GX1X1/2 (RX/OR ONLY) (NEEDLE) ×2 IMPLANT
NEEDLE 25GX 5/8IN NON SAFETY (NEEDLE) ×2 IMPLANT
NEEDLE 27GAX1X1/2 (NEEDLE) IMPLANT
NEEDLE FILTER BLUNT 18X 1/2SAF (NEEDLE) ×1
NEEDLE FILTER BLUNT 18X1 1/2 (NEEDLE) ×1 IMPLANT
NEEDLE HYPO 30X.5 LL (NEEDLE) ×4 IMPLANT
NS IRRIG 1000ML POUR BTL (IV SOLUTION) ×2 IMPLANT
PACK VITRECTOMY CUSTOM (CUSTOM PROCEDURE TRAY) ×2 IMPLANT
PAD ARMBOARD 7.5X6 YLW CONV (MISCELLANEOUS) ×4 IMPLANT
PAD EYE OVAL STERILE LF (GAUZE/BANDAGES/DRESSINGS) ×2 IMPLANT
PAK PIK VITRECTOMY CVS 25GA (OPHTHALMIC) ×2 IMPLANT
PENCIL BIPOLAR 25GA STR DISP (OPHTHALMIC RELATED) ×2 IMPLANT
PIC ILLUMINATED 25G (OPHTHALMIC) ×2
PICK MICROPICK 25G (MISCELLANEOUS) IMPLANT
PIK ILLUMINATED 25G (OPHTHALMIC) ×1 IMPLANT
PROBE LASER ILLUM FLEX CVD 25G (OPHTHALMIC) ×2 IMPLANT
REPL STRA BRUSH NEEDLE (NEEDLE) IMPLANT
RESERVOIR BACK FLUSH (MISCELLANEOUS) IMPLANT
ROLLS DENTAL (MISCELLANEOUS) ×4 IMPLANT
SCISSORS TIP ADVANCED DSP 25GA (INSTRUMENTS) IMPLANT
SCRAPER DIAMOND DUST MEMBRANE (MISCELLANEOUS) IMPLANT
SOLUTION BETADINE 4OZ (MISCELLANEOUS) ×2 IMPLANT
SPONGE SURGIFOAM ABS GEL 12-7 (HEMOSTASIS) ×2 IMPLANT
STOPCOCK 4 WAY LG BORE MALE ST (IV SETS) IMPLANT
SUT CHROMIC 7 0 TG140 8 (SUTURE) IMPLANT
SUT ETHILON 10 0 CS140 6 (SUTURE) IMPLANT
SUT ETHILON 9 0 TG140 8 (SUTURE) IMPLANT
SUT POLY NON ABSORB 10-0 8 STR (SUTURE) IMPLANT
SUT SILK 4 0 RB 1 (SUTURE) IMPLANT
SYR 20CC LL (SYRINGE) ×2 IMPLANT
SYR 5ML LL (SYRINGE) IMPLANT
SYR BULB 3OZ (MISCELLANEOUS) ×2 IMPLANT
SYR TB 1ML LUER SLIP (SYRINGE) ×2 IMPLANT
SYRINGE 10CC LL (SYRINGE) IMPLANT
TAPE SURG TRANSPORE 1 IN (GAUZE/BANDAGES/DRESSINGS) ×1 IMPLANT
TAPE SURGICAL TRANSPORE 1 IN (GAUZE/BANDAGES/DRESSINGS) ×1
TOWEL OR 17X24 6PK STRL BLUE (TOWEL DISPOSABLE) ×2 IMPLANT
TOWEL OR 17X26 10 PK STRL BLUE (TOWEL DISPOSABLE) ×2 IMPLANT
WATER STERILE IRR 1000ML POUR (IV SOLUTION) ×2 IMPLANT
WIPE INSTRUMENT VISIWIPE 73X73 (MISCELLANEOUS) ×2 IMPLANT

## 2013-09-16 NOTE — Brief Op Note (Signed)
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  Assistant:  Rosalie Doctor SA  Anesthesia: General  Specimen: none  Estimated blood loss:  1cc  Complications: none  Patient sent to PACU in good condition  Composed by Sherrie George MD  Dictation number: [phone]

## 2013-09-16 NOTE — Anesthesia Procedure Notes (Signed)
Procedure Name: Intubation Date/Time: 09/16/2013 1:30 PM Performed by: Angelica Pou Pre-anesthesia Checklist: Patient identified, Patient being monitored, Emergency Drugs available, Timeout performed and Suction available Patient Re-evaluated:Patient Re-evaluated prior to inductionOxygen Delivery Method: Circle system utilized Preoxygenation: Pre-oxygenation with 100% oxygen Intubation Type: IV induction Ventilation: Mask ventilation without difficulty Laryngoscope Size: Mac and 4 Grade View: Grade I Tube type: Oral Tube size: 7.5 mm Number of attempts: 1 Airway Equipment and Method: Stylet Placement Confirmation: ETT inserted through vocal cords under direct vision,  breath sounds checked- equal and bilateral and positive ETCO2 Secured at: 22 cm Tube secured with: Tape Dental Injury: Teeth and Oropharynx as per pre-operative assessment  Comments: Smooth IV induction by Dr Krista Blue; easy atraumatic intubation by Pasty Arch CRNA.

## 2013-09-16 NOTE — Progress Notes (Signed)
Spoke to PACU RN Loraine Leriche.  Asked him to remind floor RN to get order for patient to remain on insulin pump.  Note it appears that patient received IV Decadron, which is likely to increase CBG's.  Patient's insulin pump is currently set at 50% of basal rates, however will resume 100% basal rates around 4 pm.  Will be glad to assist as needed. Beryl Meager, RN, BC-ADM Inpatient Diabetes Coordinator Pager [phone]

## 2013-09-16 NOTE — Preoperative (Signed)
Beta Blockers   Reason not to administer Beta Blockers:Not Applicable 

## 2013-09-16 NOTE — Progress Notes (Signed)
Inpatient Diabetes Program Recommendations  AACE/ADA: New Consensus Statement on Inpatient Glycemic Control (2013)  Target Ranges:  Prepandial:   less than 140 mg/dL      Peak postprandial:   less than 180 mg/dL (1-2 hours)      Critically ill patients:  140 - 180 mg/dL   Reason for Visit: Patient has insulin pump.   Basal settings are: Total 25 hour basal=25.075 12a-0.8 units/hr 03a-1 units/hr 7a-1.35 units/hr 12 p-1.0 units/hr 9p-0.975 units/hr  He currently has insulin pump set at 50% basal rate for 4 more hours. Will need to check CBG's closely.  Please place order for insulin pump in order sets.  Patient will need to sign contract and keep flow sheet for RN to document boluses.  He is not able to see at this time however wife is at bedside and plans to spend the night to help with boluses etc.  He has had type 1 diabetes since age 81 and is managed by Dr. Gust Rung (endocrinology).  Will print contract for patient to sign.  Surgery is set to be 1 hour and therefore insulin pump is to stay in place per RN.  Will need to check CBG's closely.  Beryl Meager, RN, BC-ADM Inpatient Diabetes Coordinator Pager [phone]

## 2013-09-16 NOTE — Transfer of Care (Signed)
Immediate Anesthesia Transfer of Care Note  Patient: [name]  Procedure(s) Performed: Procedure(s) with comments: PARS PLANA VITRECTOMY WITH 25 GAUGE (Right) MEMBRANE PEEL (Right) PHOTOCOAGULATION WITH LASER (Right) - ENDOLASER AIR/FLUID EXCHANGE (Right)  Patient Location: PACU  Anesthesia Type:General  Level of Consciousness: awake, alert , oriented and patient cooperative  Airway & Oxygen Therapy: Patient Spontanous Breathing and Patient connected to nasal cannula oxygen  Post-op Assessment: Report given to PACU RN, Post -op Vital signs reviewed and stable and Patient moving all extremities  Post vital signs: Reviewed and stable  Complications: No apparent anesthesia complications

## 2013-09-16 NOTE — Anesthesia Preprocedure Evaluation (Addendum)
Anesthesia Evaluation  Patient identified by MRN, date of birth, ID band Patient awake    Reviewed: Allergy & Precautions, H&P , NPO status , Patient's Chart, lab work & pertinent test results  History of Anesthesia Complications Negative for: history of anesthetic complications  Airway Mallampati: I TM Distance: >3 FB Neck ROM: Full    Dental  (+) Teeth Intact and Dental Advisory Given   Pulmonary neg pulmonary ROS,  breath sounds clear to auscultation  Pulmonary exam normal       Cardiovascular hypertension, + CAD and + Past MI Rhythm:Regular Rate:Normal  S/p NSTEMI after ETT-echo - [facility] 7/13:  LM < 10%, pLAD 90%, mLAD 95%, pCFX tandem 90%, mCFX 80-90%, pRCA 30%, mRCA 40-50%;  PCI: Xience DES x 2 to prox and mid LAD; Xience DES x 1 to prox and mid CFX Baseline: LV global systolic function was normal. Normal   wall motion; no LV regional wall motion abnormalities. Impressions:  - Clinically negative, electrically positive for ischemia.   Stress echo with diffuse wall motion abnormalities as   noted suspicious for mutivessel CAD.    Neuro/Psych negative neurological ROS  negative psych ROS   GI/Hepatic   Endo/Other  diabetes, Insulin DependentHypothyroidism   Renal/GU      Musculoskeletal   Abdominal   Peds  Hematology   Anesthesia Other Findings   Reproductive/Obstetrics                         Anesthesia Physical Anesthesia Plan  ASA: III  Anesthesia Plan: General   Post-op Pain Management:    Induction: Intravenous  Airway Management Planned: Oral ETT  Additional Equipment:   Intra-op Plan:   Post-operative Plan: Extubation in OR  Informed Consent:   Plan Discussed with: CRNA, Anesthesiologist and Surgeon  Anesthesia Plan Comments:         Anesthesia Quick Evaluation

## 2013-09-16 NOTE — Progress Notes (Signed)
Arrived to room 6n18, A/Ox4, denies nausea/pain, oriented to room and surroundings, wife at bedside.

## 2013-09-16 NOTE — H&P (Signed)
I examined the patient today and there is no change in the medical status 

## 2013-09-17 ENCOUNTER — Encounter (HOSPITAL_COMMUNITY): Payer: Self-pay | Admitting: Ophthalmology

## 2013-09-17 LAB — GLUCOSE, CAPILLARY: Glucose-Capillary: 141 mg/dL — ABNORMAL HIGH (ref 70–99)

## 2013-09-17 MED ORDER — PREDNISOLONE ACETATE 1 % OP SUSP: 1.0000 [drp] | Freq: Four times a day (QID) | OPHTHALMIC | Status: DC

## 2013-09-17 MED ORDER — GATIFLOXACIN 0.5 % OP SOLN: 1.0000 [drp] | Freq: Four times a day (QID) | OPHTHALMIC | Status: DC

## 2013-09-17 MED ORDER — BACITRACIN-POLYMYXIN B 500-10000 UNIT/GM OP OINT: 1.0000 "application " | TOPICAL_OINTMENT | Freq: Four times a day (QID) | OPHTHALMIC | Status: DC

## 2013-09-17 NOTE — Progress Notes (Signed)
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  Discharge to home.  Give post operative instructions  Sherrie George 09/17/2013, 6:44 AM

## 2013-09-17 NOTE — Discharge Summary (Signed)
Discharge summary not needed on [facility] patients per medical records. 

## 2013-09-17 NOTE — Op Note (Signed)
NAMEBURK, HOCTOR                ACCOUNT NO.:  [id number]  MEDICAL RECORD NO.:  [id number]  LOCATION:  6N18C                        FACILITY:  [facility]  PHYSICIAN:  Beulah Gandy. Ashley Royalty, M.D. DATE OF BIRTH:  Feb 10, 1966  DATE OF PROCEDURE:  09/16/2013 DATE OF DISCHARGE:                              OPERATIVE REPORT   ADMISSION DIAGNOSES:  Proliferative diabetic retinopathy, posterior membranes vitreous hemorrhage, right eye.  PROCEDURES:  Pars plana vitrectomy, retinal photocoagulation, membrane peel, gas-fluid exchange in the right eye.  SURGEON:  Beulah Gandy. Ashley Royalty, M.D.  ASSISTANT:  Rosalie Doctor, SA.  ANESTHESIA:  General.  DETAILS:  After usual prep and drape, 25-gauge trocar was placed at 8, 10 and 2 o'clock respectively and infusion at 8 o'clock.  The lighted pick and the cutter were placed at 10 and 2 o'clock respectively.  The Provisc was placed on the corneal surface, and the BIOM viewing system was moved into place.  Pars plana vitrectomy was begun just behind the cataractous lens.  Dense white material was carefully removed under low suction and rapid cutting.  Several areas of neovascularization was seen in the retina and the endodiathermy probe was used for hemostasis internally.  The vitrectomy was carried to the midperiphery where surface membranes were present.  These were peeled with the lighted pick and the cutter until they were lifted free from the retinal surface and removed with the cutter.  The vitrectomy was carried out into the far periphery and the wide field BIOM viewing system was moved into place. Also, scleral depression was used.  All vitreous and vitreous traction was removed for 360 degrees down to the vitreous base and the pars plana.  The endolaser was positioned in the eye.  621 burns were placed around the retinal periphery.  The power was 400 mW, 1000 microns each and 0.1 seconds each.  Additional vitrectomy was performed, then a 30% gas-fluid  exchange was carried out with the vitrectomy machine infusion. The instruments were removed from the eye and balloons were tested, they were found to be secure.  Polymyxin and gentamicin were irrigated to tenon space.  Atropine solution was applied.  Marcaine was injected around the globe for postop pain.  Decadron 10 mg was injected into the lower subconjunctival space.  Polysporin ophthalmic ointment, a patch and shield were placed.  The patient was awakened, and taken to recovery in satisfactory condition.     Beulah Gandy. Ashley Royalty, M.D.    JDM/MEDQ  D:  09/16/2013  T:  09/17/2013  Job:  [id number]

## 2013-09-19 NOTE — Anesthesia Postprocedure Evaluation (Signed)
Anesthesia Post Note  Patient: [name]  Procedure(s) Performed: Procedure(s) (LRB): PARS PLANA VITRECTOMY WITH 25 GAUGE (Right) MEMBRANE PEEL (Right) PHOTOCOAGULATION WITH LASER (Right) AIR/FLUID EXCHANGE (Right)  Anesthesia type: general  Patient location: PACU  Post pain: Pain level controlled  Post assessment: Patient's Cardiovascular Status Stable   Post vital signs: Reviewed and stable  Level of consciousness: sedated  Complications: No apparent anesthesia complications

## 2013-09-23 ENCOUNTER — Encounter (INDEPENDENT_AMBULATORY_CARE_PROVIDER_SITE_OTHER): Payer: BC Managed Care – PPO | Admitting: Ophthalmology

## 2013-09-23 DIAGNOSIS — E1039 Type 1 diabetes mellitus with other diabetic ophthalmic complication: Secondary | ICD-10-CM

## 2013-09-23 DIAGNOSIS — E11359 Type 2 diabetes mellitus with proliferative diabetic retinopathy without macular edema: Secondary | ICD-10-CM

## 2013-10-13 ENCOUNTER — Encounter (INDEPENDENT_AMBULATORY_CARE_PROVIDER_SITE_OTHER): Payer: BC Managed Care – PPO | Admitting: Ophthalmology

## 2013-10-13 DIAGNOSIS — E11359 Type 2 diabetes mellitus with proliferative diabetic retinopathy without macular edema: Secondary | ICD-10-CM

## 2013-10-13 DIAGNOSIS — E1039 Type 1 diabetes mellitus with other diabetic ophthalmic complication: Secondary | ICD-10-CM

## 2013-11-19 ENCOUNTER — Ambulatory Visit (INDEPENDENT_AMBULATORY_CARE_PROVIDER_SITE_OTHER): Payer: BC Managed Care – PPO | Admitting: Ophthalmology

## 2013-12-02 ENCOUNTER — Other Ambulatory Visit (HOSPITAL_COMMUNITY): Payer: Self-pay | Admitting: Cardiology

## 2013-12-22 ENCOUNTER — Encounter (INDEPENDENT_AMBULATORY_CARE_PROVIDER_SITE_OTHER): Payer: BC Managed Care – PPO | Admitting: Ophthalmology

## 2013-12-22 DIAGNOSIS — E1065 Type 1 diabetes mellitus with hyperglycemia: Secondary | ICD-10-CM

## 2013-12-22 DIAGNOSIS — H35379 Puckering of macula, unspecified eye: Secondary | ICD-10-CM

## 2013-12-22 DIAGNOSIS — I10 Essential (primary) hypertension: Secondary | ICD-10-CM

## 2013-12-22 DIAGNOSIS — H35039 Hypertensive retinopathy, unspecified eye: Secondary | ICD-10-CM

## 2013-12-22 DIAGNOSIS — E1039 Type 1 diabetes mellitus with other diabetic ophthalmic complication: Secondary | ICD-10-CM

## 2013-12-22 DIAGNOSIS — H2510 Age-related nuclear cataract, unspecified eye: Secondary | ICD-10-CM

## 2013-12-22 DIAGNOSIS — E11359 Type 2 diabetes mellitus with proliferative diabetic retinopathy without macular edema: Secondary | ICD-10-CM

## 2013-12-22 DIAGNOSIS — H43819 Vitreous degeneration, unspecified eye: Secondary | ICD-10-CM

## 2014-05-08 ENCOUNTER — Encounter: Payer: Self-pay | Admitting: Cardiology

## 2014-05-08 ENCOUNTER — Ambulatory Visit: Payer: BC Managed Care – PPO | Admitting: Cardiology

## 2014-05-08 ENCOUNTER — Ambulatory Visit (INDEPENDENT_AMBULATORY_CARE_PROVIDER_SITE_OTHER): Payer: BC Managed Care – PPO | Admitting: Cardiology

## 2014-05-08 VITALS — BP 130/70 | Ht 71.0 in | Wt 194.0 lb

## 2014-05-08 DIAGNOSIS — I2510 Atherosclerotic heart disease of native coronary artery without angina pectoris: Secondary | ICD-10-CM

## 2014-05-08 DIAGNOSIS — I10 Essential (primary) hypertension: Secondary | ICD-10-CM

## 2014-05-08 DIAGNOSIS — E782 Mixed hyperlipidemia: Secondary | ICD-10-CM

## 2014-05-08 DIAGNOSIS — I214 Non-ST elevation (NSTEMI) myocardial infarction: Secondary | ICD-10-CM

## 2014-05-08 NOTE — Progress Notes (Signed)
HPI The patient presents for followup of his coronary disease. He is status post multivessel PCI.  He's been doing well from a cardiovascular standpoint. He exercises almost daily. He denies any chest pressure, neck or arm discomfort. He's had no palpitations, presyncope or syncope. He never really had symptoms prior to his PCI.   He has no SOB, PND or orthopnea.  He did have surgery for retinal tear and has become briefly off of aspirin since I last saw him. This has been repaired and his vision is improved.  No Known Allergies  Current Outpatient Prescriptions  Medication Sig Dispense Refill  . ACCU-CHEK AVIVA PLUS test strip 1 each by Other route as needed.       Marland Kitchen aspirin 81 MG tablet Take 81 mg by mouth daily.      Marland Kitchen atorvastatin (LIPITOR) 40 MG tablet TAKE 2 TABLETS EVERY DAY  60 tablet  5  . Coenzyme Q10 200 MG capsule Take 200 mg by mouth daily.      . folic acid (FOLVITE) 1 MG tablet Take 1 mg by mouth daily.      Marland Kitchen gatifloxacin (ZYMAXID) 0.5 % SOLN Place 1 drop into the right eye 4 (four) times daily.      . Insulin Human (INSULIN PUMP) 100 unit/ml SOLN Inject into the skin continuous. Novolog Insulin      . insulin lispro (HUMALOG) 100 UNIT/ML injection Inject into the skin 3 (three) times daily as needed (PRN). Sliding scale. 1 unit per 8 grams of carbs at each meal      . losartan (COZAAR) 25 MG tablet Take 25 mg by mouth daily.      . nitroGLYCERIN (NITROSTAT) 0.4 MG SL tablet Place 0.4 mg under the tongue every 5 (five) minutes as needed for chest pain.       No current facility-administered medications for this visit.    Past Medical History  Diagnosis Date  . Hypertension   . Family history of anesthesia complication     nausea   . Coronary artery disease     s/p NSTEMI after ETT-echo - [facility] 7/13:  LM < 10%, pLAD 90%, mLAD 95%, pCFX tandem 90%, mCFX 80-90%, pRCA 30%, mRCA 40-50%;  PCI: Xience DES x 2 to prox and mid LAD; Xience DES x 1 to prox and mid CFX  . HLD  (hyperlipidemia)   . Lung nodule     CT 7/13: 3 mm RML nodule - follow up CT in 1 year   . Hypothyroidism     not on any meds for at least 8 months.  . Type I diabetes mellitus     insulin dependent    Past Surgical History  Procedure Laterality Date  . Pars plana vitrectomy Right 09/16/2013    w/membrane peel/notes 11/25/20145  . Tonsillectomy and adenoidectomy  1970's  . Appendectomy  1980's  . Cholecystectomy  ~ 2006  . Knee arthroscopy Right ~ 1984  . Coronary angioplasty with stent placement  04/2013    LAD  . Pars plana vitrectomy Right 09/16/2013    Procedure: PARS PLANA VITRECTOMY WITH 25 GAUGE;  Surgeon: Hayden Pedro, MD;  Location: [place];  Service: Ophthalmology;  Laterality: Right;  . Membrane peel Right 09/16/2013    Procedure: MEMBRANE PEEL;  Surgeon: Hayden Pedro, MD;  Location: [place];  Service: Ophthalmology;  Laterality: Right;  . Photocoagulation with laser Right 09/16/2013    Procedure: PHOTOCOAGULATION WITH LASER;  Surgeon: Hayden Pedro, MD;  Location: [place] [facility];  Service: Ophthalmology;  Laterality: Right;  ENDOLASER  . Gas/fluid exchange Right 09/16/2013    Procedure: AIR/FLUID EXCHANGE;  Surgeon: Hayden Pedro, MD;  Location: [place];  Service: Ophthalmology;  Laterality: Right;    ROS:  As stated in the HPI and negative for all other systems.   PHYSICAL EXAM BP 130/70  Ht 5\' 11"  (1.803 m)  Wt 194 lb (87.998 kg)  BMI 27.07 kg/m2 GENERAL:  Well appearing NECK:  No jugular venous distention, waveform within normal limits, carotid upstroke brisk and symmetric, no bruits, no thyromegaly LUNGS:  Clear to auscultation bilaterally CHEST:  Unremarkable HEART:  PMI not displaced or sustained,S1 and S2 within normal limits, no S3, no S4, no clicks, no rubs, no murmurs ABD:  Flat, positive bowel sounds normal in frequency in pitch, no bruits, no rebound, no guarding, no midline pulsatile mass, no hepatomegaly, no splenomegaly EXT:  2 plus pulses throughout,  mild ankle edema, no cyanosis no clubbing  EKG: Sinus rhythm, rate 66, axis within normal limits, intervals within normal limits, no acute ST-T wave changes.. 05/08/2014  ASSESSMENT AND PLAN   CAD  He is doing well from a cardiovascular standpoint, given his aggressive coronary disease at an early age and strong family history I will routinely screen him. He will have a I will bring the patient back for a POET (Plain Old Exercise Test). This will allow me to screen for obstructive coronary disease, risk stratify and very importantly provide a prescription for exercise.  Hypertension  The blood pressure is at target. No change in medications is indicated. We will continue with therapeutic lifestyle changes (TLC).  Hyperlipidemia  I will check a lipid profile.  The goal will be an LDL less than 70 and HDL greater than 50.

## 2014-05-08 NOTE — Patient Instructions (Signed)
Your physician recommends that you schedule a follow-up appointment in: one year with Dr. Hochrein  We are ordering a stress test  

## 2014-05-10 ENCOUNTER — Other Ambulatory Visit: Payer: Self-pay | Admitting: Physician Assistant

## 2014-06-09 ENCOUNTER — Telehealth (HOSPITAL_COMMUNITY): Payer: Self-pay

## 2014-06-09 NOTE — Telephone Encounter (Signed)
Encounter complete. 

## 2014-06-10 ENCOUNTER — Other Ambulatory Visit: Payer: Self-pay | Admitting: *Deleted

## 2014-06-10 ENCOUNTER — Telehealth (HOSPITAL_COMMUNITY): Payer: Self-pay

## 2014-06-10 MED ORDER — ATORVASTATIN CALCIUM 40 MG PO TABS: ORAL_TABLET | ORAL | Status: DC

## 2014-06-10 NOTE — Telephone Encounter (Signed)
Encounter complete. 

## 2014-06-11 ENCOUNTER — Ambulatory Visit (HOSPITAL_COMMUNITY)
Admission: RE | Admit: 2014-06-11 | Discharge: 2014-06-11 | Disposition: A | Discharge status: Home / Self Care | Payer: BC Managed Care – PPO | Source: Ambulatory Visit | Attending: Cardiology | Admitting: Cardiology

## 2014-06-11 DIAGNOSIS — I2510 Atherosclerotic heart disease of native coronary artery without angina pectoris: Secondary | ICD-10-CM | POA: Diagnosis present

## 2014-06-11 DIAGNOSIS — I10 Essential (primary) hypertension: Secondary | ICD-10-CM | POA: Insufficient documentation

## 2014-06-11 DIAGNOSIS — I214 Non-ST elevation (NSTEMI) myocardial infarction: Secondary | ICD-10-CM | POA: Diagnosis not present

## 2014-06-11 NOTE — Procedures (Signed)
Exercise Treadmill Test   Test  Exercise Tolerance Test Ordering MD: Minus Breeding, MD  Interpreting MD:   Unique Test No: Treadmill:  1  Indication for ETT: Strong FAM HX-Stent/PTCA Patency  Contraindication to ETT: No   Stress Modality: exercise - treadmill  Cardiac Imaging Performed: non   Protocol: standard Bruce - maximal  Max BP:  211/63  Max MPHR (bpm):  171 85% MPR (bpm): 145  MPHR obtained (bpm): 173 % MPHR obtained:  101  Reached 85% MPHR (min:sec): 8:55 Total Exercise Time (min-sec): 13:01  Workload in METS:  15.30 Borg Scale:   Reason ETT Terminated:  fatigue    ST Segment Analysis At Rest: NSR with no ST changes. With Exercise: significant ischemic ST depression  Other Information Arrhythmia:  No Angina during ETT:  absent (0) Quality of ETT:  diagnostic  ETT Interpretation:  abnormal - evidence of ST depression consistent with ischemia  Comments: ETT with no chest pain; hypertensive BP response; positive ST depression in the inferior lateral leads with peak exertion and in recovery consistent with ischemia. Abnormal ETT.

## 2014-06-12 LAB — LIPID PANEL
CHOL/HDL RATIO: 2.3 ratio
CHOLESTEROL: 104 mg/dL (ref 0–200)
HDL: 46 mg/dL (ref >39)
LDL Cholesterol: 44 mg/dL (ref 0–99)
Triglycerides: 72 mg/dL (ref <150)
VLDL: 14 mg/dL (ref 0–40)

## 2014-06-18 ENCOUNTER — Other Ambulatory Visit: Payer: Self-pay

## 2014-06-18 ENCOUNTER — Telehealth: Payer: Self-pay | Admitting: Cardiology

## 2014-06-18 MED ORDER — NITROGLYCERIN 0.4 MG SL SUBL: 0.4000 mg | SUBLINGUAL_TABLET | SUBLINGUAL | Status: DC | PRN

## 2014-06-18 NOTE — Telephone Encounter (Signed)
Would stress test and lab results from last week please.

## 2014-06-18 NOTE — Telephone Encounter (Signed)
Message forwarded to JC/Dr. [name] to advise

## 2014-06-19 NOTE — Telephone Encounter (Signed)
Pt called in wanting to know if the results were in from his blood test and stress test done on 08/20 and he stated that he needs new rx for the NTG. Please call  thanks

## 2014-06-19 NOTE — Telephone Encounter (Signed)
i can't find a result to tell the pt. On this stress test from 8/20

## 2014-06-22 NOTE — Telephone Encounter (Signed)
Pt. Called and informed about his test

## 2014-06-23 ENCOUNTER — Telehealth: Payer: Self-pay | Admitting: Cardiology

## 2014-06-23 NOTE — Telephone Encounter (Signed)
Pt said when he talked to you yesterday,he forgot to ask you to mail his lab and stress test results.

## 2014-06-23 NOTE — Telephone Encounter (Signed)
stress test and lab sent

## 2014-06-24 ENCOUNTER — Ambulatory Visit (INDEPENDENT_AMBULATORY_CARE_PROVIDER_SITE_OTHER): Payer: BC Managed Care – PPO | Admitting: Ophthalmology

## 2014-07-02 ENCOUNTER — Ambulatory Visit (INDEPENDENT_AMBULATORY_CARE_PROVIDER_SITE_OTHER): Payer: BC Managed Care – PPO | Admitting: Ophthalmology

## 2014-07-02 DIAGNOSIS — E1039 Type 1 diabetes mellitus with other diabetic ophthalmic complication: Secondary | ICD-10-CM

## 2014-07-02 DIAGNOSIS — H35039 Hypertensive retinopathy, unspecified eye: Secondary | ICD-10-CM

## 2014-07-02 DIAGNOSIS — H43819 Vitreous degeneration, unspecified eye: Secondary | ICD-10-CM

## 2014-07-02 DIAGNOSIS — I10 Essential (primary) hypertension: Secondary | ICD-10-CM

## 2014-07-02 DIAGNOSIS — H2510 Age-related nuclear cataract, unspecified eye: Secondary | ICD-10-CM

## 2014-07-02 DIAGNOSIS — H35379 Puckering of macula, unspecified eye: Secondary | ICD-10-CM

## 2014-07-02 DIAGNOSIS — E11359 Type 2 diabetes mellitus with proliferative diabetic retinopathy without macular edema: Secondary | ICD-10-CM

## 2014-07-02 DIAGNOSIS — E1065 Type 1 diabetes mellitus with hyperglycemia: Secondary | ICD-10-CM

## 2014-07-13 ENCOUNTER — Telehealth: Payer: Self-pay | Admitting: Cardiology

## 2014-07-13 NOTE — Telephone Encounter (Signed)
Closed encounters °

## 2014-07-29 ENCOUNTER — Ambulatory Visit: Payer: BC Managed Care – PPO | Admitting: Cardiology

## 2014-08-27 ENCOUNTER — Telehealth: Payer: Self-pay

## 2014-08-27 NOTE — Telephone Encounter (Signed)
Patient called no answer.Left message to return my call I need to schedule appointment with Dr.Hochrein in 10 days.

## 2014-09-22 ENCOUNTER — Telehealth: Payer: Self-pay

## 2014-09-22 NOTE — Telephone Encounter (Signed)
Patient called no answer.Left message on personal voice mail Dr.Hochrein wanted to see you after stress test.Advised to call back to schedule appointment with Dr.Hochrein.

## 2014-09-22 NOTE — Telephone Encounter (Signed)
Received a call back from patient he stated he got a new job and has been unable to return my phone call.Appointment scheduled with Dr.Hochrein 10/02/14 at 3:15 pm.

## 2014-10-01 ENCOUNTER — Encounter (HOSPITAL_COMMUNITY): Payer: Self-pay | Admitting: Cardiology

## 2014-10-02 ENCOUNTER — Ambulatory Visit: Payer: BC Managed Care – PPO | Admitting: Cardiology

## 2014-11-05 ENCOUNTER — Encounter (HOSPITAL_COMMUNITY): Payer: Self-pay | Admitting: Ophthalmology

## 2014-12-05 ENCOUNTER — Other Ambulatory Visit: Payer: Self-pay | Admitting: Cardiology

## 2014-12-31 ENCOUNTER — Ambulatory Visit (INDEPENDENT_AMBULATORY_CARE_PROVIDER_SITE_OTHER): Payer: BC Managed Care – PPO | Admitting: Ophthalmology

## 2015-01-08 ENCOUNTER — Other Ambulatory Visit: Payer: Self-pay | Admitting: Cardiology

## 2015-02-10 ENCOUNTER — Other Ambulatory Visit: Payer: Self-pay | Admitting: Cardiology

## 2015-02-18 ENCOUNTER — Ambulatory Visit (INDEPENDENT_AMBULATORY_CARE_PROVIDER_SITE_OTHER): Payer: BC Managed Care – PPO | Admitting: Ophthalmology

## 2015-03-04 ENCOUNTER — Other Ambulatory Visit: Payer: Self-pay | Admitting: Cardiology

## 2015-03-04 ENCOUNTER — Other Ambulatory Visit: Payer: Self-pay | Admitting: *Deleted

## 2015-03-04 MED ORDER — LOSARTAN POTASSIUM 25 MG PO TABS: 25.0000 mg | ORAL_TABLET | Freq: Every day | ORAL | Status: DC

## 2015-03-16 ENCOUNTER — Ambulatory Visit (INDEPENDENT_AMBULATORY_CARE_PROVIDER_SITE_OTHER): Payer: PRIVATE HEALTH INSURANCE | Admitting: Ophthalmology

## 2015-03-16 DIAGNOSIS — H35371 Puckering of macula, right eye: Secondary | ICD-10-CM

## 2015-03-16 DIAGNOSIS — E10351 Type 1 diabetes mellitus with proliferative diabetic retinopathy with macular edema: Secondary | ICD-10-CM

## 2015-03-16 DIAGNOSIS — H43812 Vitreous degeneration, left eye: Secondary | ICD-10-CM

## 2015-03-16 DIAGNOSIS — I10 Essential (primary) hypertension: Secondary | ICD-10-CM

## 2015-03-16 DIAGNOSIS — E10311 Type 1 diabetes mellitus with unspecified diabetic retinopathy with macular edema: Secondary | ICD-10-CM

## 2015-03-16 DIAGNOSIS — E10359 Type 1 diabetes mellitus with proliferative diabetic retinopathy without macular edema: Secondary | ICD-10-CM | POA: Diagnosis not present

## 2015-03-16 DIAGNOSIS — H35033 Hypertensive retinopathy, bilateral: Secondary | ICD-10-CM

## 2015-05-13 ENCOUNTER — Other Ambulatory Visit: Payer: Self-pay | Admitting: Cardiology

## 2015-05-13 ENCOUNTER — Other Ambulatory Visit: Payer: Self-pay | Admitting: *Deleted

## 2015-05-13 NOTE — Telephone Encounter (Signed)
REFILL 

## 2015-06-08 ENCOUNTER — Other Ambulatory Visit: Payer: Self-pay | Admitting: Cardiology

## 2015-06-08 NOTE — Telephone Encounter (Signed)
Rx(s) sent to pharmacy electronically.  

## 2015-06-15 ENCOUNTER — Ambulatory Visit: Payer: BC Managed Care – PPO | Admitting: Cardiology

## 2015-06-22 ENCOUNTER — Ambulatory Visit: Payer: BC Managed Care – PPO | Admitting: Cardiology

## 2015-06-29 ENCOUNTER — Encounter: Payer: Self-pay | Admitting: Cardiology

## 2015-06-29 ENCOUNTER — Ambulatory Visit (INDEPENDENT_AMBULATORY_CARE_PROVIDER_SITE_OTHER): Payer: PRIVATE HEALTH INSURANCE | Admitting: Cardiology

## 2015-06-29 VITALS — BP 136/76 | HR 64 | Ht 71.0 in | Wt 203.7 lb

## 2015-06-29 DIAGNOSIS — I2510 Atherosclerotic heart disease of native coronary artery without angina pectoris: Secondary | ICD-10-CM | POA: Diagnosis not present

## 2015-06-29 DIAGNOSIS — I10 Essential (primary) hypertension: Secondary | ICD-10-CM | POA: Diagnosis not present

## 2015-06-29 NOTE — Progress Notes (Signed)
HPI The patient presents for followup of his coronary disease. He is status post multivessel PCI.  He's been doing well from a cardiovascular standpoint. He did have a stress test last year and with exercise 13 minutes without symptoms. There were some hypertensive changes. However, these were nondiagnostic for ischemic changes and a bit of a hypertensive response.  He exercises almost daily. He denies any chest pressure, neck or arm discomfort. He's had no palpitations, presyncope or syncope. He never really had symptoms prior to his PCI.   He has no SOB, PND or orthopnea.   No Known Allergies  Current Outpatient Prescriptions  Medication Sig Dispense Refill  . ACCU-CHEK AVIVA PLUS test strip 1 each by Other route as needed.     Marland Kitchen aspirin 81 MG tablet Take 81 mg by mouth daily.    Marland Kitchen atorvastatin (LIPITOR) 40 MG tablet TAKE 2 TABLETS EVERY DAY 60 tablet 10  . Coenzyme Q10 200 MG capsule Take 200 mg by mouth daily.    . Insulin Human (INSULIN PUMP) 100 unit/ml SOLN Inject into the skin continuous. Novolog Insulin    . losartan (COZAAR) 25 MG tablet TAKE 1 TABLET EVERY DAY 90 tablet 0  . nitroGLYCERIN (NITROSTAT) 0.4 MG SL tablet Place 1 tablet (0.4 mg total) under the tongue every 5 (five) minutes as needed for chest pain. 25 tablet 1  . NOVOLOG 100 UNIT/ML injection USE AS DIRECTED IN INSULIN PUMP UP TO 125 UNITS DAILY  5   No current facility-administered medications for this visit.    Past Medical History  Diagnosis Date  . Hypertension   . Family history of anesthesia complication     nausea   . Coronary artery disease     s/p NSTEMI after ETT-echo - [facility] 7/13:  LM < 10%, pLAD 90%, mLAD 95%, pCFX tandem 90%, mCFX 80-90%, pRCA 30%, mRCA 40-50%;  PCI: Xience DES x 2 to prox and mid LAD; Xience DES x 1 to prox and mid CFX  . HLD (hyperlipidemia)   . Lung nodule     CT 7/13: 3 mm RML nodule - felt to be stable and benign  . Hypothyroidism     not on any meds for at least 8 months.  .  Type I diabetes mellitus     insulin dependent    Past Surgical History  Procedure Laterality Date  . Pars plana vitrectomy Right 09/16/2013    w/membrane peel/notes 11/25/20145  . Tonsillectomy and adenoidectomy  1970's  . Appendectomy  1980's  . Cholecystectomy  ~ 2006  . Knee arthroscopy Right ~ 1984  . Coronary angioplasty with stent placement  04/2013    LAD  . Pars plana vitrectomy Right 09/16/2013    Procedure: PARS PLANA VITRECTOMY WITH 25 GAUGE;  Surgeon: Hayden Pedro, MD;  Location: [place];  Service: Ophthalmology;  Laterality: Right;  . Membrane peel Right 09/16/2013    Procedure: MEMBRANE PEEL;  Surgeon: Hayden Pedro, MD;  Location: [place];  Service: Ophthalmology;  Laterality: Right;  . Photocoagulation with laser Right 09/16/2013    Procedure: PHOTOCOAGULATION WITH LASER;  Surgeon: Hayden Pedro, MD;  Location: [place];  Service: Ophthalmology;  Laterality: Right;  ENDOLASER  . Gas/fluid exchange Right 09/16/2013    Procedure: AIR/FLUID EXCHANGE;  Surgeon: Hayden Pedro, MD;  Location: [place];  Service: Ophthalmology;  Laterality: Right;  . Left heart catheterization with coronary angiogram N/A 05/03/2012    Procedure: LEFT HEART CATHETERIZATION WITH CORONARY ANGIOGRAM;  Surgeon: Peter M Martinique, MD;  Location: [facility] CATH LAB;  Service: Cardiovascular;  Laterality: N/A;    ROS:  As stated in the HPI and negative for all other systems.   PHYSICAL EXAM BP 136/76 mmHg  Pulse 64  Ht 5\' 11"  (1.803 m)  Wt 203 lb 11.2 oz (92.398 kg)  BMI 28.42 kg/m2 GENERAL:  Well appearing NECK:  No jugular venous distention, waveform within normal limits, carotid upstroke brisk and symmetric, no bruits, no thyromegaly LUNGS:  Clear to auscultation bilaterally CHEST:  Unremarkable HEART:  PMI not displaced or sustained,S1 and S2 within normal limits, no S3, no S4, no clicks, no rubs, no murmurs ABD:  Flat, positive bowel sounds normal in frequency in pitch, no bruits, no rebound, no  guarding, no midline pulsatile mass, no hepatomegaly, no splenomegaly EXT:  2 plus pulses throughout, mild ankle edema, no cyanosis no clubbing  EKG: Sinus rhythm, rate 64, axis within normal limits, intervals within normal limits, no acute ST-T wave changes. RSR prime V1 and V2. 06/29/2015  ASSESSMENT AND PLAN   CAD  The patient has no new sypmtoms.  No further cardiovascular testing is indicated.  We will continue with aggressive risk reduction and meds as listed.  I will likely do a routine screening POET (Plain Old Exercise Treadmill) next year.    Hypertension  The blood pressure is at target. No change in medications is indicated. We will continue with therapeutic lifestyle changes (TLC).  Hyperlipidemia  He will have this checked soon by Maris Berger, MD and I would be happy to review this.  The goal will be an LDL less than 70 and HDL greater than 50.

## 2015-06-29 NOTE — Patient Instructions (Signed)
Your physician wants you to follow-up in: 1 Year. You will receive a reminder letter in the mail two months in advance. If you don't receive a letter, please call our office to schedule the follow-up appointment.  

## 2015-08-30 ENCOUNTER — Other Ambulatory Visit: Payer: Self-pay | Admitting: Cardiology

## 2015-09-06 ENCOUNTER — Encounter: Payer: Self-pay | Admitting: Cardiology

## 2015-10-21 ENCOUNTER — Other Ambulatory Visit: Payer: Self-pay

## 2015-10-21 MED ORDER — NITROGLYCERIN 0.4 MG SL SUBL: 0.4000 mg | SUBLINGUAL_TABLET | SUBLINGUAL | Status: DC | PRN

## 2015-10-21 NOTE — Telephone Encounter (Signed)
Minus Breeding, MD at 06/29/2015 4:16 PM  nitroGLYCERIN (NITROSTAT) 0.4 MG SL tabletPlace 1 tablet (0.4 mg total) under the tongue every 5 (five) minutes as needed for chest pain

## 2015-11-16 ENCOUNTER — Encounter (INDEPENDENT_AMBULATORY_CARE_PROVIDER_SITE_OTHER): Payer: PRIVATE HEALTH INSURANCE | Admitting: Ophthalmology

## 2015-12-22 ENCOUNTER — Encounter (INDEPENDENT_AMBULATORY_CARE_PROVIDER_SITE_OTHER): Payer: PRIVATE HEALTH INSURANCE | Admitting: Ophthalmology

## 2015-12-29 ENCOUNTER — Encounter (INDEPENDENT_AMBULATORY_CARE_PROVIDER_SITE_OTHER): Payer: PRIVATE HEALTH INSURANCE | Admitting: Ophthalmology

## 2016-04-23 ENCOUNTER — Other Ambulatory Visit: Payer: Self-pay | Admitting: Cardiology

## 2016-05-03 ENCOUNTER — Encounter (INDEPENDENT_AMBULATORY_CARE_PROVIDER_SITE_OTHER): Payer: PRIVATE HEALTH INSURANCE | Admitting: Ophthalmology

## 2016-05-03 DIAGNOSIS — E103592 Type 1 diabetes mellitus with proliferative diabetic retinopathy without macular edema, left eye: Secondary | ICD-10-CM

## 2016-05-03 DIAGNOSIS — E103511 Type 1 diabetes mellitus with proliferative diabetic retinopathy with macular edema, right eye: Secondary | ICD-10-CM | POA: Diagnosis not present

## 2016-05-03 DIAGNOSIS — H35371 Puckering of macula, right eye: Secondary | ICD-10-CM

## 2016-05-03 DIAGNOSIS — H35033 Hypertensive retinopathy, bilateral: Secondary | ICD-10-CM

## 2016-05-03 DIAGNOSIS — E10311 Type 1 diabetes mellitus with unspecified diabetic retinopathy with macular edema: Secondary | ICD-10-CM | POA: Diagnosis not present

## 2016-05-03 DIAGNOSIS — H43812 Vitreous degeneration, left eye: Secondary | ICD-10-CM | POA: Diagnosis not present

## 2016-05-03 DIAGNOSIS — I10 Essential (primary) hypertension: Secondary | ICD-10-CM | POA: Diagnosis not present

## 2016-06-15 ENCOUNTER — Encounter: Payer: Self-pay | Admitting: Cardiology

## 2016-06-27 NOTE — Progress Notes (Signed)
HPI The patient presents for followup of his coronary disease. He is status post multivessel PCI.  He's been doing well since I last saw him.  He did have a stress test in 2015 and was able to exercise 13 minutes without symptoms. There were some hypertensive changes. However, these were nondiagnostic for ischemic changes and a bit of a hypertensive response.  He does exercise routinely. With this he denies any cardiovascular symptoms. He's not having any chest pressure, neck or arm discomfort. He's not having outpatient, presyncope or syncope. He has no PND or orthopnea. He has no edema.  He does have weight gain.  No Known Allergies  Current Outpatient Prescriptions  Medication Sig Dispense Refill  . ACCU-CHEK AVIVA PLUS test strip 1 each by Other route as needed.     Marland Kitchen aspirin 81 MG tablet Take 81 mg by mouth daily.    Marland Kitchen atorvastatin (LIPITOR) 40 MG tablet Take 2 tablets (80 mg total) by mouth daily. 60 tablet 2  . Coenzyme Q10 200 MG capsule Take 200 mg by mouth daily.    . insulin aspart (NOVOLOG) 100 UNIT/ML injection Inject 1 Units into the skin as directed.    . Insulin Degludec 100 UNIT/ML SOPN Inject 30 Units as directed daily.    Marland Kitchen losartan (COZAAR) 25 MG tablet TAKE 1 TABLET EVERY DAY 90 tablet 3  . nitroGLYCERIN (NITROSTAT) 0.4 MG SL tablet Place 1 tablet (0.4 mg total) under the tongue every 5 (five) minutes as needed for chest pain. 25 tablet 1   No current facility-administered medications for this visit.     Past Medical History:  Diagnosis Date  . Coronary artery disease    s/p NSTEMI after ETT-echo - [facility] 7/13:  LM < 10%, pLAD 90%, mLAD 95%, pCFX tandem 90%, mCFX 80-90%, pRCA 30%, mRCA 40-50%;  PCI: Xience DES x 2 to prox and mid LAD; Xience DES x 1 to prox and mid CFX  . Family history of anesthesia complication    nausea   . HLD (hyperlipidemia)   . Hypertension   . Hypothyroidism    not on any meds for at least 8 months.  . Lung nodule    CT 7/13: 3 mm RML nodule  - felt to be stable and benign  . Type I diabetes mellitus (HCC)    insulin dependent    Past Surgical History:  Procedure Laterality Date  . APPENDECTOMY  1980's  . CHOLECYSTECTOMY  ~ 2006  . CORONARY ANGIOPLASTY WITH STENT PLACEMENT  04/2013   LAD  . GAS/FLUID EXCHANGE Right 09/16/2013   Procedure: AIR/FLUID EXCHANGE;  Surgeon: Hayden Pedro, MD;  Location: [place];  Service: Ophthalmology;  Laterality: Right;  . KNEE ARTHROSCOPY Right ~ 1984  . LEFT HEART CATHETERIZATION WITH CORONARY ANGIOGRAM N/A 05/03/2012   Procedure: LEFT HEART CATHETERIZATION WITH CORONARY ANGIOGRAM;  Surgeon: Peter M Martinique, MD;  Location: [facility] CATH LAB;  Service: Cardiovascular;  Laterality: N/A;  . MEMBRANE PEEL Right 09/16/2013   Procedure: MEMBRANE PEEL;  Surgeon: Hayden Pedro, MD;  Location: [place];  Service: Ophthalmology;  Laterality: Right;  . PARS PLANA VITRECTOMY Right 09/16/2013   w/membrane peel/notes 11/25/20145  . PARS PLANA VITRECTOMY Right 09/16/2013   Procedure: PARS PLANA VITRECTOMY WITH 25 GAUGE;  Surgeon: Hayden Pedro, MD;  Location: [place];  Service: Ophthalmology;  Laterality: Right;  . PHOTOCOAGULATION WITH LASER Right 09/16/2013   Procedure: PHOTOCOAGULATION WITH LASER;  Surgeon: Hayden Pedro, MD;  Location: [place];  Service: Ophthalmology;  Laterality: Right;  ENDOLASER  . TONSILLECTOMY AND ADENOIDECTOMY  1970's    ROS:  As stated in the HPI and negative for all other systems.   PHYSICAL EXAM BP (!) 154/80   Pulse 83   Ht 5\' 11"  (1.803 m)   Wt 212 lb 9.6 oz (96.4 kg)   SpO2 99%   BMI 29.65 kg/m  GENERAL:  Well appearing NECK:  No jugular venous distention, waveform within normal limits, carotid upstroke brisk and symmetric, no bruits, no thyromegaly LUNGS:  Clear to auscultation bilaterally CHEST:  Unremarkable HEART:  PMI not displaced or sustained,S1 and S2 within normal limits, no S3, no S4, no clicks, no rubs, no murmurs ABD:  Flat, positive bowel sounds normal in  frequency in pitch, no bruits, no rebound, no guarding, no midline pulsatile mass, no hepatomegaly, no splenomegaly EXT:  2 plus pulses throughout, mild ankle edema, no cyanosis no clubbing  EKG: Sinus rhythm, rate 78, axis within normal limits, intervals within normal limits, no acute ST-T wave changes. RSR prime V1 and V2. 06/29/2016  ASSESSMENT AND PLAN   CAD   We will continue with aggressive risk reduction and meds as listed.  I will bring the patient back for a POET (Plain Old Exercise Test). This will allow me to screen for obstructive coronary disease, risk stratify and very importantly provide a prescription for exercise.  Hypertension  The blood pressure is always elevated in the office but not at home.Marland Kitchen No change in medications is indicated. We will continue with therapeutic lifestyle changes (TLC).  Hyperlipidemia  I will check a lipid profile when he comes back for his POET (Plain Old Exercise Treadmill)

## 2016-06-29 ENCOUNTER — Ambulatory Visit (INDEPENDENT_AMBULATORY_CARE_PROVIDER_SITE_OTHER): Payer: PRIVATE HEALTH INSURANCE | Admitting: Cardiology

## 2016-06-29 ENCOUNTER — Encounter: Payer: Self-pay | Admitting: Cardiology

## 2016-06-29 VITALS — BP 154/80 | HR 83 | Ht 71.0 in | Wt 212.6 lb

## 2016-06-29 DIAGNOSIS — I10 Essential (primary) hypertension: Secondary | ICD-10-CM | POA: Diagnosis not present

## 2016-06-29 DIAGNOSIS — I2510 Atherosclerotic heart disease of native coronary artery without angina pectoris: Secondary | ICD-10-CM

## 2016-06-29 DIAGNOSIS — E785 Hyperlipidemia, unspecified: Secondary | ICD-10-CM

## 2016-06-29 DIAGNOSIS — Z79899 Other long term (current) drug therapy: Secondary | ICD-10-CM | POA: Diagnosis not present

## 2016-06-29 NOTE — Patient Instructions (Signed)
Medication Instructions:  Continue current medications  Labwork: Fasting Lipid liver  Testing/Procedures: Your physician has requested that you have an exercise tolerance test. For further information please visit [email or web address]. Please also follow instruction sheet, as given.  Follow-Up: Your physician wants you to follow-up in: 1 Year. You will receive a reminder letter in the mail two months in advance. If you don't receive a letter, please call our office to schedule the follow-up appointment.  Any Other Special Instructions Will Be Listed Below (If Applicable).   If you need a refill on your cardiac medications before your next appointment, please call your pharmacy.

## 2016-07-06 ENCOUNTER — Telehealth (HOSPITAL_COMMUNITY): Payer: Self-pay

## 2016-07-06 NOTE — Telephone Encounter (Signed)
Encounter complete. 

## 2016-07-11 ENCOUNTER — Ambulatory Visit (HOSPITAL_COMMUNITY)
Admission: RE | Admit: 2016-07-11 | Discharge: 2016-07-11 | Disposition: A | Discharge status: Home / Self Care | Payer: PRIVATE HEALTH INSURANCE | Source: Ambulatory Visit | Attending: Cardiovascular Disease | Admitting: Cardiovascular Disease

## 2016-07-11 DIAGNOSIS — I10 Essential (primary) hypertension: Secondary | ICD-10-CM | POA: Diagnosis not present

## 2016-07-11 LAB — HEPATIC FUNCTION PANEL
ALT: 21 U/L (ref 9–46)
AST: 25 U/L (ref 10–35)
Albumin: 3.9 g/dL (ref 3.6–5.1)
Alkaline Phosphatase: 47 U/L (ref 40–115)
Bilirubin, Direct: 0.2 mg/dL (ref <=0.2)
Indirect Bilirubin: 0.8 mg/dL (ref 0.2–1.2)
Total Bilirubin: 1 mg/dL (ref 0.2–1.2)
Total Protein: 6.6 g/dL (ref 6.1–8.1)

## 2016-07-11 LAB — EXERCISE TOLERANCE TEST
CHL CUP RESTING HR STRESS: 82 {beats}/min
CSEPEW: 12.7 METS
Exercise duration (min): 10 min
Exercise duration (sec): 36 s
MPHR: 169 {beats}/min
Peak HR: 173 {beats}/min
Percent HR: 102 %
RPE: 17

## 2016-07-11 LAB — LIPID PANEL
Cholesterol: 109 mg/dL — ABNORMAL LOW (ref 125–200)
HDL: 43 mg/dL (ref >=40)
LDL Cholesterol: 57 mg/dL (ref <130)
Total CHOL/HDL Ratio: 2.5 ratio (ref <=5.0)
Triglycerides: 47 mg/dL (ref <150)
VLDL: 9 mg/dL (ref <30)

## 2016-07-12 ENCOUNTER — Telehealth: Payer: Self-pay | Admitting: Cardiology

## 2016-07-12 NOTE — Telephone Encounter (Signed)
Patient called wanted to know if results were available- pa would like to schedule upcoming surgery. Informed patient . Test and labs have not been reviewed by Dr [name]. Preliminary result of labs given. Patient verbalized understanding.

## 2016-07-12 NOTE — Telephone Encounter (Signed)
New Message ° °Pt call requesting to speak with RN about test results. Please call back to discuss  °

## 2016-07-13 ENCOUNTER — Encounter: Payer: Self-pay | Admitting: Cardiology

## 2016-07-21 ENCOUNTER — Encounter (INDEPENDENT_AMBULATORY_CARE_PROVIDER_SITE_OTHER): Payer: PRIVATE HEALTH INSURANCE | Admitting: Ophthalmology

## 2016-07-21 DIAGNOSIS — E103511 Type 1 diabetes mellitus with proliferative diabetic retinopathy with macular edema, right eye: Secondary | ICD-10-CM

## 2016-07-21 DIAGNOSIS — E10311 Type 1 diabetes mellitus with unspecified diabetic retinopathy with macular edema: Secondary | ICD-10-CM

## 2016-07-21 DIAGNOSIS — H35033 Hypertensive retinopathy, bilateral: Secondary | ICD-10-CM | POA: Diagnosis not present

## 2016-07-21 DIAGNOSIS — E103592 Type 1 diabetes mellitus with proliferative diabetic retinopathy without macular edema, left eye: Secondary | ICD-10-CM

## 2016-07-21 DIAGNOSIS — I10 Essential (primary) hypertension: Secondary | ICD-10-CM

## 2016-07-21 DIAGNOSIS — H35371 Puckering of macula, right eye: Secondary | ICD-10-CM | POA: Diagnosis not present

## 2016-07-23 NOTE — H&P (Signed)
[name] is an 50 y.o. male.   Chief Complaint: loss of vision right eye HPI: At least 6 weeks of declining vision right eye  Past Medical History:  Diagnosis Date  . Coronary artery disease    s/p NSTEMI after ETT-echo - [facility] 7/13:  LM < 10%, pLAD 90%, mLAD 95%, pCFX tandem 90%, mCFX 80-90%, pRCA 30%, mRCA 40-50%;  PCI: Xience DES x 2 to prox and mid LAD; Xience DES x 1 to prox and mid CFX  . Family history of anesthesia complication    nausea   . HLD (hyperlipidemia)   . Hypertension   . Hypothyroidism    not on any meds for at least 8 months.  . Lung nodule    CT 7/13: 3 mm RML nodule - felt to be stable and benign  . Type I diabetes mellitus (HCC)    insulin dependent    Past Surgical History:  Procedure Laterality Date  . APPENDECTOMY  1980's  . CHOLECYSTECTOMY  ~ 2006  . CORONARY ANGIOPLASTY WITH STENT PLACEMENT  04/2013   LAD  . GAS/FLUID EXCHANGE Right 09/16/2013   Procedure: AIR/FLUID EXCHANGE;  Surgeon: Hayden Pedro, MD;  Location: [place];  Service: Ophthalmology;  Laterality: Right;  . KNEE ARTHROSCOPY Right ~ 1984  . LEFT HEART CATHETERIZATION WITH CORONARY ANGIOGRAM N/A 05/03/2012   Procedure: LEFT HEART CATHETERIZATION WITH CORONARY ANGIOGRAM;  Surgeon: Peter M Martinique, MD;  Location: [facility] CATH LAB;  Service: Cardiovascular;  Laterality: N/A;  . MEMBRANE PEEL Right 09/16/2013   Procedure: MEMBRANE PEEL;  Surgeon: Hayden Pedro, MD;  Location: [place];  Service: Ophthalmology;  Laterality: Right;  . PARS PLANA VITRECTOMY Right 09/16/2013   w/membrane peel/notes 11/25/20145  . PARS PLANA VITRECTOMY Right 09/16/2013   Procedure: PARS PLANA VITRECTOMY WITH 25 GAUGE;  Surgeon: Hayden Pedro, MD;  Location: [place];  Service: Ophthalmology;  Laterality: Right;  . PHOTOCOAGULATION WITH LASER Right 09/16/2013   Procedure: PHOTOCOAGULATION WITH LASER;  Surgeon: Hayden Pedro, MD;  Location: [place];  Service: Ophthalmology;  Laterality: Right;  ENDOLASER  . TONSILLECTOMY  AND ADENOIDECTOMY  1970's    Family History  Problem Relation Age of Onset  . Retinitis pigmentosa Father   . Coronary artery disease Brother 19    Died  . Heart attack Brother   . Heart disease Brother   . Hypertension Mother   . Coronary artery disease Maternal Uncle 80    Died  . Coronary artery disease Maternal Grandmother 68    Died  . Aneurysm Maternal Grandfather 85    Abdominal  . Aneurysm Daughter 16    Cerebral   Social History:  reports that he has never smoked. He has never used smokeless tobacco. He reports that he does not drink alcohol or use drugs.  Allergies: No Known Allergies  No prescriptions prior to admission.    Review of systems otherwise negative  There were no vitals taken for this visit.  Physical exam: Mental status: oriented x3. Eyes: See eye exam associated with this date of surgery in media tab.  Scanned in by scanning center Ears, Nose, Throat: within normal limits Neck: Within Normal limits General: within normal limits Chest: Within normal limits Breast: deferred Heart: Within normal limits Abdomen: Within normal limits GU: deferred Extremities: within normal limits Skin: within normal limits  Assessment/Plan Preretinal fibrosis right eye Plan: To [facility] for Pars plana vitrectomy, membrane peel, laser, gas injection right eye  MATTHEWS, Chrystie Nose  07/23/2016, 3:41 PM

## 2016-07-30 ENCOUNTER — Other Ambulatory Visit: Payer: Self-pay | Admitting: Cardiology

## 2016-07-31 ENCOUNTER — Encounter (HOSPITAL_COMMUNITY): Payer: Self-pay | Admitting: *Deleted

## 2016-07-31 NOTE — Progress Notes (Signed)
[name] reports that he does not have elevated blood pressure, he takes Losortan for kidney preservation.  Patient denies chest pain. Patient reports that CBGs run 90 - under 200. I instructed patient to check CBG to check CBG and if it is less than 70 to treat it with Glucose Gel, Glucose tablets or 1/2 cup of clear juice like apple juice or cranberry juice, or 1/2 cup of regular soda. (not cream soda). I instructed patient to recheck CBG in 15 minutes and if CBG is not greater than 70, to  Call [phone] (pre- op). If it is before pre-op opens to retreat as before and recheck CBG in 15 minutes. I told patient to make note of time that liquid is taken and amount, that surgical time may have to be adjusted.

## 2016-08-01 ENCOUNTER — Ambulatory Visit (HOSPITAL_COMMUNITY): Payer: PRIVATE HEALTH INSURANCE | Admitting: Anesthesiology

## 2016-08-01 ENCOUNTER — Encounter (HOSPITAL_COMMUNITY): Payer: Self-pay | Admitting: *Deleted

## 2016-08-01 ENCOUNTER — Encounter (HOSPITAL_COMMUNITY)
Admission: RE | Disposition: A | Discharge status: Home / Self Care | Payer: Self-pay | Source: Ambulatory Visit | Attending: Ophthalmology

## 2016-08-01 ENCOUNTER — Ambulatory Visit (HOSPITAL_COMMUNITY)
Admission: RE | Admit: 2016-08-01 | Discharge: 2016-08-02 | Disposition: A | Discharge status: Home / Self Care | Payer: PRIVATE HEALTH INSURANCE | Source: Ambulatory Visit | Attending: Ophthalmology | Admitting: Ophthalmology

## 2016-08-01 DIAGNOSIS — H35371 Puckering of macula, right eye: Secondary | ICD-10-CM | POA: Diagnosis present

## 2016-08-01 DIAGNOSIS — Z8249 Family history of ischemic heart disease and other diseases of the circulatory system: Secondary | ICD-10-CM | POA: Insufficient documentation

## 2016-08-01 DIAGNOSIS — Z794 Long term (current) use of insulin: Secondary | ICD-10-CM | POA: Diagnosis not present

## 2016-08-01 DIAGNOSIS — H35341 Macular cyst, hole, or pseudohole, right eye: Secondary | ICD-10-CM | POA: Diagnosis present

## 2016-08-01 DIAGNOSIS — Z955 Presence of coronary angioplasty implant and graft: Secondary | ICD-10-CM | POA: Diagnosis not present

## 2016-08-01 DIAGNOSIS — I252 Old myocardial infarction: Secondary | ICD-10-CM | POA: Insufficient documentation

## 2016-08-01 DIAGNOSIS — I2510 Atherosclerotic heart disease of native coronary artery without angina pectoris: Secondary | ICD-10-CM | POA: Insufficient documentation

## 2016-08-01 DIAGNOSIS — E039 Hypothyroidism, unspecified: Secondary | ICD-10-CM | POA: Insufficient documentation

## 2016-08-01 DIAGNOSIS — E113591 Type 2 diabetes mellitus with proliferative diabetic retinopathy without macular edema, right eye: Secondary | ICD-10-CM | POA: Diagnosis not present

## 2016-08-01 DIAGNOSIS — E785 Hyperlipidemia, unspecified: Secondary | ICD-10-CM | POA: Diagnosis not present

## 2016-08-01 DIAGNOSIS — I10 Essential (primary) hypertension: Secondary | ICD-10-CM | POA: Insufficient documentation

## 2016-08-01 HISTORY — DX: Dyspnea, unspecified: R06.00

## 2016-08-01 HISTORY — PX: GAS/FLUID EXCHANGE: SHX5334

## 2016-08-01 HISTORY — PX: SERUM PATCH: SHX6091

## 2016-08-01 HISTORY — PX: 25 GAUGE PARS PLANA VITRECTOMY WITH 20 GAUGE MVR PORT FOR MACULAR HOLE: SHX6096

## 2016-08-01 LAB — GLUCOSE, CAPILLARY
GLUCOSE-CAPILLARY: 122 mg/dL — AB (ref 65–99)
GLUCOSE-CAPILLARY: 103 mg/dL — AB (ref 65–99)
GLUCOSE-CAPILLARY: 172 mg/dL — AB (ref 65–99)
GLUCOSE-CAPILLARY: 249 mg/dL — AB (ref 65–99)
Glucose-Capillary: 141 mg/dL — ABNORMAL HIGH (ref 65–99)
Glucose-Capillary: 301 mg/dL — ABNORMAL HIGH (ref 65–99)

## 2016-08-01 LAB — BASIC METABOLIC PANEL
ANION GAP: 5 (ref 5–15)
BUN: 8 mg/dL (ref 6–20)
CO2: 26 mmol/L (ref 22–32)
Calcium: 9.4 mg/dL (ref 8.9–10.3)
Chloride: 108 mmol/L (ref 101–111)
Creatinine, Ser: 0.71 mg/dL (ref 0.61–1.24)
GFR calc Af Amer: >60 mL/min (ref >60)
GFR calc non Af Amer: >60 mL/min (ref >60)
GLUCOSE: 141 mg/dL — AB (ref 65–99)
POTASSIUM: 3.8 mmol/L (ref 3.5–5.1)
Sodium: 139 mmol/L (ref 135–145)

## 2016-08-01 LAB — CBC
HEMATOCRIT: 44.9 % (ref 39.0–52.0)
Hemoglobin: 15 g/dL (ref 13.0–17.0)
MCH: 31.6 pg (ref 26.0–34.0)
MCHC: 33.4 g/dL (ref 30.0–36.0)
MCV: 94.7 fL (ref 78.0–100.0)
Platelets: 274 10*3/uL (ref 150–400)
RBC: 4.74 MIL/uL (ref 4.22–5.81)
RDW: 13.4 % (ref 11.5–15.5)
WBC: 6.4 10*3/uL (ref 4.0–10.5)

## 2016-08-01 LAB — SURGICAL PCR SCREEN
MRSA, PCR: NEGATIVE
STAPHYLOCOCCUS AUREUS: POSITIVE — AB

## 2016-08-01 LAB — AUTOLOGOUS SERUM PATCH PREP

## 2016-08-01 SURGERY — 25 GAUGE PARS PLANA VITRECTOMY WITH 20 GAUGE MVR PORT FOR MACULAR HOLE
Anesthesia: General | Site: Eye | Laterality: Right

## 2016-08-01 MED ORDER — SODIUM CHLORIDE 0.9 % IV SOLN
INTRAVENOUS | Status: DC
  Administered 2016-08-01 (×2): via INTRAVENOUS

## 2016-08-01 MED ORDER — NITROGLYCERIN 0.4 MG SL SUBL: 0.4000 mg | SUBLINGUAL_TABLET | SUBLINGUAL | Status: DC | PRN

## 2016-08-01 MED ORDER — GATIFLOXACIN 0.5 % OP SOLN
1.0000 [drp] | OPHTHALMIC | Status: AC | PRN
  Administered 2016-08-01 (×3): 1 [drp] via OPHTHALMIC
  Filled 2016-08-01: qty 2.5

## 2016-08-01 MED ORDER — POLYMYXIN B SULFATE 500000 UNITS IJ SOLR
INTRAMUSCULAR | Status: AC
  Filled 2016-08-01: qty 500000

## 2016-08-01 MED ORDER — INSULIN DEGLUDEC 100 UNIT/ML ~~LOC~~ SOPN: 30.0000 [IU] | PEN_INJECTOR | Freq: Every morning | SUBCUTANEOUS | Status: DC

## 2016-08-01 MED ORDER — DORZOLAMIDE HCL 2 % OP SOLN
1.0000 [drp] | Freq: Three times a day (TID) | OPHTHALMIC | Status: DC
  Filled 2016-08-01: qty 10

## 2016-08-01 MED ORDER — INSULIN ASPART 100 UNIT/ML ~~LOC~~ SOLN
5.0000 [IU] | Freq: Once | SUBCUTANEOUS | Status: AC
  Administered 2016-08-01: 5 [IU] via SUBCUTANEOUS

## 2016-08-01 MED ORDER — EPINEPHRINE PF 1 MG/ML IJ SOLN
INTRAMUSCULAR | Status: AC
  Filled 2016-08-01: qty 1

## 2016-08-01 MED ORDER — DEXAMETHASONE SODIUM PHOSPHATE 10 MG/ML IJ SOLN
INTRAMUSCULAR | Status: AC
  Filled 2016-08-01: qty 1

## 2016-08-01 MED ORDER — MUPIROCIN 2 % EX OINT
1.0000 "application " | TOPICAL_OINTMENT | Freq: Once | CUTANEOUS | Status: AC
  Administered 2016-08-01: 1 via TOPICAL
  Filled 2016-08-01: qty 22

## 2016-08-01 MED ORDER — SODIUM HYALURONATE 10 MG/ML IO SOLN
INTRAOCULAR | Status: AC
  Filled 2016-08-01: qty 0.85

## 2016-08-01 MED ORDER — SODIUM CHLORIDE 0.9 % IJ SOLN
INTRAMUSCULAR | Status: AC
  Filled 2016-08-01: qty 10

## 2016-08-01 MED ORDER — LIDOCAINE HCL 2 % IJ SOLN
INTRAMUSCULAR | Status: AC
  Filled 2016-08-01: qty 20

## 2016-08-01 MED ORDER — PREDNISOLONE ACETATE 1 % OP SUSP
1.0000 [drp] | Freq: Four times a day (QID) | OPHTHALMIC | Status: DC
  Filled 2016-08-01: qty 5

## 2016-08-01 MED ORDER — MIDAZOLAM HCL 2 MG/2ML IJ SOLN
INTRAMUSCULAR | Status: AC
  Filled 2016-08-01: qty 2

## 2016-08-01 MED ORDER — COENZYME Q10 200 MG PO CAPS: 200.0000 mg | ORAL_CAPSULE | Freq: Every day | ORAL | Status: DC

## 2016-08-01 MED ORDER — LACTATED RINGERS IV SOLN
INTRAVENOUS | Status: DC | PRN
  Administered 2016-08-01: 12:00:00 via INTRAVENOUS

## 2016-08-01 MED ORDER — HYALURONIDASE HUMAN 150 UNIT/ML IJ SOLN
INTRAMUSCULAR | Status: AC
  Filled 2016-08-01: qty 1

## 2016-08-01 MED ORDER — CEFTAZIDIME 1 G IJ SOLR
INTRAMUSCULAR | Status: AC
  Filled 2016-08-01: qty 1

## 2016-08-01 MED ORDER — HEMOSTATIC AGENTS (NO CHARGE) OPTIME
TOPICAL | Status: DC | PRN
  Administered 2016-08-01: 1 via TOPICAL

## 2016-08-01 MED ORDER — LATANOPROST 0.005 % OP SOLN
1.0000 [drp] | Freq: Every day | OPHTHALMIC | Status: DC
  Filled 2016-08-01: qty 2.5

## 2016-08-01 MED ORDER — ACETAZOLAMIDE SODIUM 500 MG IJ SOLR
500.0000 mg | Freq: Once | INTRAMUSCULAR | Status: AC
  Administered 2016-08-02: 500 mg via INTRAVENOUS
  Filled 2016-08-01: qty 500

## 2016-08-01 MED ORDER — FENTANYL CITRATE (PF) 100 MCG/2ML IJ SOLN
INTRAMUSCULAR | Status: DC | PRN
  Administered 2016-08-01: 100 ug via INTRAVENOUS

## 2016-08-01 MED ORDER — BRIMONIDINE TARTRATE 0.2 % OP SOLN
1.0000 [drp] | Freq: Two times a day (BID) | OPHTHALMIC | Status: DC
  Filled 2016-08-01: qty 5

## 2016-08-01 MED ORDER — ARTIFICIAL TEARS OP OINT
TOPICAL_OINTMENT | OPHTHALMIC | Status: DC | PRN
  Administered 2016-08-01: 1 via OPHTHALMIC

## 2016-08-01 MED ORDER — TEMAZEPAM 15 MG PO CAPS: 15.0000 mg | ORAL_CAPSULE | Freq: Every evening | ORAL | Status: DC | PRN

## 2016-08-01 MED ORDER — SODIUM CHLORIDE 0.45 % IV SOLN
INTRAVENOUS | Status: DC
  Administered 2016-08-01 – 2016-08-02 (×2): via INTRAVENOUS

## 2016-08-01 MED ORDER — INSULIN ASPART 100 UNIT/ML ~~LOC~~ SOLN: 1.0000 [IU] | SUBCUTANEOUS | Status: DC

## 2016-08-01 MED ORDER — BSS PLUS IO SOLN
INTRAOCULAR | Status: AC
  Filled 2016-08-01: qty 500

## 2016-08-01 MED ORDER — CEFAZOLIN SODIUM-DEXTROSE 2-4 GM/100ML-% IV SOLN
2.0000 g | INTRAVENOUS | Status: AC
  Administered 2016-08-01: 2 g via INTRAVENOUS
  Filled 2016-08-01: qty 100

## 2016-08-01 MED ORDER — 0.9 % SODIUM CHLORIDE (POUR BTL) OPTIME
TOPICAL | Status: DC | PRN
  Administered 2016-08-01: 1000 mL

## 2016-08-01 MED ORDER — BACITRACIN-POLYMYXIN B 500-10000 UNIT/GM OP OINT
TOPICAL_OINTMENT | OPHTHALMIC | Status: DC | PRN
  Administered 2016-08-01: 1 via OPHTHALMIC

## 2016-08-01 MED ORDER — LOSARTAN POTASSIUM 50 MG PO TABS
25.0000 mg | ORAL_TABLET | Freq: Every day | ORAL | Status: DC
  Administered 2016-08-01: 25 mg via ORAL
  Filled 2016-08-01: qty 1

## 2016-08-01 MED ORDER — STERILE WATER FOR INJECTION IJ SOLN
INTRAMUSCULAR | Status: AC
  Filled 2016-08-01: qty 20

## 2016-08-01 MED ORDER — FENTANYL CITRATE (PF) 100 MCG/2ML IJ SOLN: 25.0000 ug | INTRAMUSCULAR | Status: DC | PRN

## 2016-08-01 MED ORDER — ACETAZOLAMIDE SODIUM 500 MG IJ SOLR
INTRAMUSCULAR | Status: AC
  Filled 2016-08-01: qty 500

## 2016-08-01 MED ORDER — PROPOFOL 10 MG/ML IV BOLUS
INTRAVENOUS | Status: AC
  Filled 2016-08-01: qty 40

## 2016-08-01 MED ORDER — ATROPINE SULFATE 1 % OP SOLN
OPHTHALMIC | Status: AC
  Filled 2016-08-01: qty 5

## 2016-08-01 MED ORDER — HYDROCODONE-ACETAMINOPHEN 5-325 MG PO TABS
1.0000 | ORAL_TABLET | ORAL | Status: DC | PRN
  Administered 2016-08-01: 2 via ORAL
  Filled 2016-08-01: qty 2

## 2016-08-01 MED ORDER — CYCLOPENTOLATE HCL 1 % OP SOLN
1.0000 [drp] | OPHTHALMIC | Status: AC | PRN
  Administered 2016-08-01 (×3): 1 [drp] via OPHTHALMIC
  Filled 2016-08-01: qty 2

## 2016-08-01 MED ORDER — TROPICAMIDE 1 % OP SOLN
1.0000 [drp] | OPHTHALMIC | Status: AC | PRN
  Administered 2016-08-01 (×3): 1 [drp] via OPHTHALMIC
  Filled 2016-08-01: qty 3

## 2016-08-01 MED ORDER — INSULIN ASPART 100 UNIT/ML ~~LOC~~ SOLN
0.0000 [IU] | SUBCUTANEOUS | Status: DC
  Administered 2016-08-01: 3 [IU] via SUBCUTANEOUS
  Administered 2016-08-01: 11 [IU] via SUBCUTANEOUS
  Administered 2016-08-02: 3 [IU] via SUBCUTANEOUS
  Administered 2016-08-02: 5 [IU] via SUBCUTANEOUS

## 2016-08-01 MED ORDER — MIDAZOLAM HCL 5 MG/5ML IJ SOLN
INTRAMUSCULAR | Status: DC | PRN
  Administered 2016-08-01: 2 mg via INTRAVENOUS

## 2016-08-01 MED ORDER — INSULIN GLARGINE 100 UNIT/ML ~~LOC~~ SOLN
10.0000 [IU] | Freq: Every day | SUBCUTANEOUS | Status: DC
  Filled 2016-08-01: qty 0.1

## 2016-08-01 MED ORDER — BUPIVACAINE HCL (PF) 0.75 % IJ SOLN
INTRAMUSCULAR | Status: AC
  Filled 2016-08-01: qty 10

## 2016-08-01 MED ORDER — MORPHINE SULFATE (PF) 2 MG/ML IV SOLN: 1.0000 mg | INTRAVENOUS | Status: DC | PRN

## 2016-08-01 MED ORDER — BACITRACIN-POLYMYXIN B 500-10000 UNIT/GM OP OINT
TOPICAL_OINTMENT | OPHTHALMIC | Status: AC
  Filled 2016-08-01: qty 3.5

## 2016-08-01 MED ORDER — SUGAMMADEX SODIUM 200 MG/2ML IV SOLN
INTRAVENOUS | Status: DC | PRN
  Administered 2016-08-01: 200 mg via INTRAVENOUS

## 2016-08-01 MED ORDER — LIDOCAINE HCL (CARDIAC) 20 MG/ML IV SOLN
INTRAVENOUS | Status: DC | PRN
  Administered 2016-08-01: 60 mg via INTRAVENOUS

## 2016-08-01 MED ORDER — HYPROMELLOSE (GONIOSCOPIC) 2.5 % OP SOLN
OPHTHALMIC | Status: AC
  Filled 2016-08-01: qty 15

## 2016-08-01 MED ORDER — BRIMONIDINE TARTRATE 0.15 % OP SOLN
1.0000 [drp] | OPHTHALMIC | Status: AC
  Administered 2016-08-01: 1 [drp] via OPHTHALMIC
  Filled 2016-08-01: qty 5

## 2016-08-01 MED ORDER — BSS IO SOLN
INTRAOCULAR | Status: AC
  Filled 2016-08-01: qty 15

## 2016-08-01 MED ORDER — ALBUTEROL SULFATE HFA 108 (90 BASE) MCG/ACT IN AERS: 1.0000 | INHALATION_SPRAY | Freq: Four times a day (QID) | RESPIRATORY_TRACT | Status: DC | PRN

## 2016-08-01 MED ORDER — ONDANSETRON HCL 4 MG/2ML IJ SOLN: 4.0000 mg | Freq: Four times a day (QID) | INTRAMUSCULAR | Status: DC | PRN

## 2016-08-01 MED ORDER — BSS PLUS IO SOLN
INTRAOCULAR | Status: DC | PRN
  Administered 2016-08-01: 1

## 2016-08-01 MED ORDER — MAGNESIUM HYDROXIDE 400 MG/5ML PO SUSP: 15.0000 mL | Freq: Four times a day (QID) | ORAL | Status: DC | PRN

## 2016-08-01 MED ORDER — GATIFLOXACIN 0.5 % OP SOLN
1.0000 [drp] | Freq: Four times a day (QID) | OPHTHALMIC | Status: DC
  Filled 2016-08-01: qty 2.5

## 2016-08-01 MED ORDER — ATORVASTATIN CALCIUM 80 MG PO TABS
80.0000 mg | ORAL_TABLET | Freq: Every day | ORAL | Status: DC
  Administered 2016-08-01: 80 mg via ORAL
  Filled 2016-08-01: qty 1

## 2016-08-01 MED ORDER — ROCURONIUM BROMIDE 100 MG/10ML IV SOLN
INTRAVENOUS | Status: DC | PRN
  Administered 2016-08-01: 60 mg via INTRAVENOUS

## 2016-08-01 MED ORDER — TRIAMCINOLONE ACETONIDE 40 MG/ML IJ SUSP
INTRAMUSCULAR | Status: AC
  Filled 2016-08-01: qty 5

## 2016-08-01 MED ORDER — INFLUENZA VAC SPLIT QUAD 0.5 ML IM SUSY: 0.5000 mL | PREFILLED_SYRINGE | INTRAMUSCULAR | Status: DC

## 2016-08-01 MED ORDER — PROMETHAZINE HCL 25 MG/ML IJ SOLN: 6.2500 mg | INTRAMUSCULAR | Status: DC | PRN

## 2016-08-01 MED ORDER — INSULIN ASPART 100 UNIT/ML ~~LOC~~ SOLN
1.0000 [IU] | Freq: Three times a day (TID) | SUBCUTANEOUS | Status: DC
  Administered 2016-08-01: 4 [IU] via SUBCUTANEOUS

## 2016-08-01 MED ORDER — BACITRACIN-POLYMYXIN B 500-10000 UNIT/GM OP OINT: 1.0000 "application " | TOPICAL_OINTMENT | Freq: Three times a day (TID) | OPHTHALMIC | Status: DC

## 2016-08-01 MED ORDER — SODIUM HYALURONATE 10 MG/ML IO SOLN
INTRAOCULAR | Status: DC | PRN
  Administered 2016-08-01: 0.85 mL via INTRAOCULAR

## 2016-08-01 MED ORDER — PROPOFOL 10 MG/ML IV BOLUS
INTRAVENOUS | Status: DC | PRN
  Administered 2016-08-01: 200 mg via INTRAVENOUS

## 2016-08-01 MED ORDER — PHENYLEPHRINE HCL 2.5 % OP SOLN
1.0000 [drp] | OPHTHALMIC | Status: AC | PRN
  Administered 2016-08-01 (×3): 1 [drp] via OPHTHALMIC
  Filled 2016-08-01: qty 2

## 2016-08-01 MED ORDER — ACETAMINOPHEN 325 MG PO TABS
325.0000 mg | ORAL_TABLET | ORAL | Status: DC | PRN
  Administered 2016-08-01 (×2): 650 mg via ORAL
  Filled 2016-08-01 (×2): qty 2

## 2016-08-01 MED ORDER — FENTANYL CITRATE (PF) 100 MCG/2ML IJ SOLN
INTRAMUSCULAR | Status: AC
  Filled 2016-08-01: qty 2

## 2016-08-01 MED ORDER — TETRACAINE HCL 0.5 % OP SOLN
2.0000 [drp] | Freq: Once | OPHTHALMIC | Status: DC
  Filled 2016-08-01: qty 2

## 2016-08-01 SURGICAL SUPPLY — 61 items
BLADE EYE CATARACT 19 1.4 BEAV (BLADE) IMPLANT
BLADE MVR KNIFE 19G (BLADE) IMPLANT
BLADE MVR KNIFE 20G (BLADE) ×2 IMPLANT
CANNULA VLV SOFT TIP 25GA (OPHTHALMIC) ×2 IMPLANT
CORDS BIPOLAR (ELECTRODE) ×2 IMPLANT
COTTONBALL LRG STERILE PKG (GAUZE/BANDAGES/DRESSINGS) ×6 IMPLANT
COVER MAYO STAND STRL (DRAPES) IMPLANT
DRAPE INCISE 51X51 W/FILM STRL (DRAPES) IMPLANT
DRAPE OPHTHALMIC 77X100 STRL (CUSTOM PROCEDURE TRAY) ×2 IMPLANT
ERASER HMR WETFIELD 23G BP (MISCELLANEOUS) ×2 IMPLANT
FILTER BLUE MILLIPORE (MISCELLANEOUS) IMPLANT
FILTER STRAW FLUID ASPIR (MISCELLANEOUS) ×2 IMPLANT
FORCEPS GRIESHABER ILM 25G A (INSTRUMENTS) ×2 IMPLANT
GAS AUTO FILL CONSTEL (OPHTHALMIC) ×2
GAS AUTO FILL CONSTELLATION (OPHTHALMIC) ×1 IMPLANT
GLOVE ECLIPSE 6.0 STRL STRAW (GLOVE) ×2 IMPLANT
GLOVE SS BIOGEL STRL SZ 6.5 (GLOVE) ×1 IMPLANT
GLOVE SS BIOGEL STRL SZ 7 (GLOVE) ×1 IMPLANT
GLOVE SUPERSENSE BIOGEL SZ 6.5 (GLOVE) ×1
GLOVE SUPERSENSE BIOGEL SZ 7 (GLOVE) ×1
GLOVE SURG 8.5 LATEX PF (GLOVE) ×4 IMPLANT
GOWN STRL REUS W/ TWL LRG LVL3 (GOWN DISPOSABLE) ×3 IMPLANT
GOWN STRL REUS W/TWL LRG LVL3 (GOWN DISPOSABLE) ×3
HANDLE PNEUMATIC FOR CONSTEL (OPHTHALMIC) ×2 IMPLANT
KIT BASIN OR (CUSTOM PROCEDURE TRAY) ×2 IMPLANT
KNIFE GRIESHABER SHARP 2.5MM (MISCELLANEOUS) IMPLANT
MICROPICK 25G (MISCELLANEOUS) ×2
NEEDLE 18GX1X1/2 (RX/OR ONLY) (NEEDLE) ×2 IMPLANT
NEEDLE 25GX 5/8IN NON SAFETY (NEEDLE) ×2 IMPLANT
NEEDLE FILTER BLUNT 18X 1/2SAF (NEEDLE)
NEEDLE FILTER BLUNT 18X1 1/2 (NEEDLE) IMPLANT
NEEDLE HYPO 30X.5 LL (NEEDLE) IMPLANT
NS IRRIG 1000ML POUR BTL (IV SOLUTION) ×2 IMPLANT
PACK FRAGMATOME (OPHTHALMIC) IMPLANT
PACK VITRECTOMY CUSTOM (CUSTOM PROCEDURE TRAY) ×2 IMPLANT
PAD ARMBOARD 7.5X6 YLW CONV (MISCELLANEOUS) ×4 IMPLANT
PAK PIK VITRECTOMY CVS 25GA (OPHTHALMIC) ×2 IMPLANT
PIC ILLUMINATED 25G (OPHTHALMIC) ×2
PICK MICROPICK 25G (MISCELLANEOUS) ×1 IMPLANT
PIK ILLUMINATED 25G (OPHTHALMIC) ×1 IMPLANT
PROBE LASER ILLUM FLEX CVD 25G (OPHTHALMIC) ×2 IMPLANT
REPL STRA BRUSH NEEDLE (NEEDLE) ×2 IMPLANT
RESERVOIR BACK FLUSH (MISCELLANEOUS) ×2 IMPLANT
ROLLS DENTAL (MISCELLANEOUS) ×4 IMPLANT
SCRAPER DIAMOND 25GA (OPHTHALMIC RELATED) IMPLANT
SCRAPER DIAMOND DUST MEMBRANE (MISCELLANEOUS) ×2 IMPLANT
SPONGE SURGIFOAM ABS GEL 12-7 (HEMOSTASIS) ×2 IMPLANT
STOPCOCK 4 WAY LG BORE MALE ST (IV SETS) IMPLANT
SUT CHROMIC 7 0 TG140 8 (SUTURE) IMPLANT
SUT ETHILON 9 0 TG140 8 (SUTURE) ×2 IMPLANT
SUT POLY NON ABSORB 10-0 8 STR (SUTURE) IMPLANT
SUT SILK 4 0 RB 1 (SUTURE) IMPLANT
SUT VICRYL 7 0 TG140 8 (SUTURE) ×2 IMPLANT
SYR 20CC LL (SYRINGE) ×2 IMPLANT
SYR 5ML LL (SYRINGE) IMPLANT
SYR BULB 3OZ (MISCELLANEOUS) ×2 IMPLANT
SYR TB 1ML LUER SLIP (SYRINGE) ×2 IMPLANT
SYRINGE 10CC LL (SYRINGE) IMPLANT
TUBING HIGH PRESS EXTEN 6IN (TUBING) IMPLANT
WATER STERILE IRR 1000ML POUR (IV SOLUTION) ×2 IMPLANT
WIPE INSTRUMENT VISIWIPE 73X73 (MISCELLANEOUS) IMPLANT

## 2016-08-01 NOTE — Anesthesia Procedure Notes (Addendum)
Procedure Name: Intubation Date/Time: 08/01/2016 12:09 PM Performed by: Scheryl Darter Pre-anesthesia Checklist: Patient identified, Emergency Drugs available, Suction available and Patient being monitored Patient Re-evaluated:Patient Re-evaluated prior to inductionOxygen Delivery Method: Circle System Utilized Preoxygenation: Pre-oxygenation with 100% oxygen Intubation Type: IV induction Ventilation: Mask ventilation without difficulty Laryngoscope Size: Miller and 3 Grade View: Grade I Tube type: Oral Number of attempts: 1 Airway Equipment and Method: Stylet Placement Confirmation: ETT inserted through vocal cords under direct vision,  positive ETCO2 and breath sounds checked- equal and bilateral Secured at: 23 cm Tube secured with: Tape Dental Injury: Teeth and Oropharynx as per pre-operative assessment

## 2016-08-01 NOTE — Anesthesia Preprocedure Evaluation (Signed)
Anesthesia Evaluation  Patient identified by MRN, date of birth, ID band Patient awake    Reviewed: Allergy & Precautions, NPO status , Patient's Chart, lab work & pertinent test results  Airway Mallampati: II  TM Distance: >3 FB Neck ROM: Full    Dental no notable dental hx.    Pulmonary neg pulmonary ROS,    Pulmonary exam normal breath sounds clear to auscultation       Cardiovascular hypertension, + CAD, + Past MI and + Cardiac Stents  Normal cardiovascular exam Rhythm:Regular Rate:Normal     Neuro/Psych negative neurological ROS  negative psych ROS   GI/Hepatic negative GI ROS, Neg liver ROS,   Endo/Other  diabetesHypothyroidism   Renal/GU negative Renal ROS  negative genitourinary   Musculoskeletal negative musculoskeletal ROS (+)   Abdominal   Peds negative pediatric ROS (+)  Hematology negative hematology ROS (+)   Anesthesia Other Findings   Reproductive/Obstetrics negative OB ROS                             Anesthesia Physical Anesthesia Plan  ASA: III  Anesthesia Plan: General   Post-op Pain Management:    Induction: Intravenous  Airway Management Planned: LMA and Oral ETT  Additional Equipment:   Intra-op Plan:   Post-operative Plan: Extubation in OR  Informed Consent: I have reviewed the patients History and Physical, chart, labs and discussed the procedure including the risks, benefits and alternatives for the proposed anesthesia with the patient or authorized representative who has indicated his/her understanding and acceptance.   Dental advisory given  Plan Discussed with: CRNA and Surgeon  Anesthesia Plan Comments:         Anesthesia Quick Evaluation

## 2016-08-01 NOTE — H&P (Signed)
I examined the patient today and there is no change in the medical status 

## 2016-08-01 NOTE — Brief Op Note (Signed)
08/01/2016  1:28 PM  PATIENT:  [name]  50 y.o. 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  Assistant:  Deatra Ina SA    Anesthesia: General  Specimen: none  Estimated blood loss:  1cc  Complications: none  Patient sent to PACU in good condition  Composed by Hayden Pedro MD  Dictation number: [id number]

## 2016-08-01 NOTE — Anesthesia Postprocedure Evaluation (Signed)
Anesthesia Post Note  Patient: Valentino Nose  Procedure(s) Performed: Procedure(s) (LRB): 25 GAUGE PARS PLANA VITRECTOMY WITH 20 GAUGE MVR PORT FOR MACULAR HOLE, MEMBRANE PEEL AND SERUM PATCH WITH ENDOLASER AND C3F8 (Right) SERUM PATCH (Right) GAS/FLUID EXCHANGE (Right)  Patient location during evaluation: PACU Anesthesia Type: General Level of consciousness: awake and alert Pain management: pain level controlled Vital Signs Assessment: post-procedure vital signs reviewed and stable Respiratory status: spontaneous breathing, nonlabored ventilation, respiratory function stable and patient connected to nasal cannula oxygen Cardiovascular status: blood pressure returned to baseline and stable Postop Assessment: no signs of nausea or vomiting Anesthetic complications: no    Last Vitals:  Vitals:   08/01/16 1406 08/01/16 1415  BP: 137/68   Pulse: 76 76  Resp: 14 18  Temp:      Last Pain:  Vitals:   08/01/16 1336  TempSrc:   PainSc: 0-No pain                 Maggi Hershkowitz S

## 2016-08-01 NOTE — Transfer of Care (Signed)
Immediate Anesthesia Transfer of Care Note  Patient: [name]  Procedure(s) Performed: Procedure(s): 25 GAUGE PARS PLANA VITRECTOMY WITH 20 GAUGE MVR PORT FOR MACULAR HOLE, MEMBRANE PEEL AND SERUM PATCH WITH ENDOLASER AND C3F8 (Right) SERUM PATCH (Right) GAS/FLUID EXCHANGE (Right)  Patient Location: PACU  Anesthesia Type:General  Level of Consciousness: awake, alert , oriented and sedated  Airway & Oxygen Therapy: Patient Spontanous Breathing and Patient connected to nasal cannula oxygen  Post-op Assessment: Report given to RN, Post -op Vital signs reviewed and stable and Patient moving all extremities  Post vital signs: Reviewed and stable  Last Vitals:  Vitals:   08/01/16 0934 08/01/16 1336  BP: 132/72   Pulse: (!) 59   Resp: 18   Temp: 36.8 C 36.7 C    Last Pain:  Vitals:   08/01/16 1336  TempSrc:   PainSc: 0-No pain      Patients Stated Pain Goal: 4 ([id number])  Complications: No apparent anesthesia complications

## 2016-08-02 ENCOUNTER — Encounter (HOSPITAL_COMMUNITY): Payer: Self-pay | Admitting: Ophthalmology

## 2016-08-02 DIAGNOSIS — H35341 Macular cyst, hole, or pseudohole, right eye: Secondary | ICD-10-CM | POA: Diagnosis not present

## 2016-08-02 LAB — GLUCOSE, CAPILLARY
GLUCOSE-CAPILLARY: 240 mg/dL — AB (ref 65–99)
Glucose-Capillary: 156 mg/dL — ABNORMAL HIGH (ref 65–99)

## 2016-08-02 MED ORDER — INSULIN ASPART 100 UNIT/ML ~~LOC~~ SOLN
5.0000 [IU] | Freq: Once | SUBCUTANEOUS | Status: AC
  Administered 2016-08-02: 5 [IU] via SUBCUTANEOUS

## 2016-08-02 MED ORDER — BRIMONIDINE TARTRATE 0.2 % OP SOLN: 1.0000 [drp] | Freq: Two times a day (BID) | OPHTHALMIC | 12 refills | Status: DC

## 2016-08-02 MED ORDER — GATIFLOXACIN 0.5 % OP SOLN: 1.0000 [drp] | Freq: Four times a day (QID) | OPHTHALMIC | Status: DC

## 2016-08-02 MED ORDER — PREDNISOLONE ACETATE 1 % OP SUSP: 1.0000 [drp] | Freq: Four times a day (QID) | OPHTHALMIC | 0 refills | Status: DC

## 2016-08-02 MED ORDER — BACITRACIN-POLYMYXIN B 500-10000 UNIT/GM OP OINT: 1.0000 "application " | TOPICAL_OINTMENT | Freq: Three times a day (TID) | OPHTHALMIC | 0 refills | Status: DC

## 2016-08-02 NOTE — Op Note (Signed)
[name], [name]                ACCOUNT NO.:  [id number]  MEDICAL RECORD NO.:  [id number]  LOCATION:  6N23C                        FACILITY:  [facility]  PHYSICIAN:  Chrystie Nose. Zigmund Daniel, M.D. DATE OF BIRTH:  [birth year]  DATE OF PROCEDURE:  08/01/2016 DATE OF DISCHARGE:  08/02/2016                              OPERATIVE REPORT   ADMISSION DIAGNOSIS:  Macular hole, proliferative diabetic retinopathy, preretinal membrane in the right eye.  PROCEDURES:  Pars plana vitrectomy, retinal photocoagulation, membrane peel, ILM peel, serum patch, gas fluid exchange, retinal photocoagulation right eye.  SURGEON:  Chrystie Nose. Zigmund Daniel, MD.  ASSISTANT:  Deatra Ina, SA.  ANESTHESIA:  General.  DESCRIPTION OF PROCEDURE:  Usual prep and drape, 25-gauge trocar was placed at 8 and 10 o'clock with infusion at 8 o'clock.  A 3-layered MVR incision was made at 2 o'clock.  Contact lens ring was anchored into place at 6 and 12 o'clock.  ProVisc placed on the corneal surface and the flat contact lens was placed.  Pars plana vitrectomy was begun just behind the cataractous lens.  The lens was found to have vacuoles and bubbles in it and was cataractous. Vitrectomy was carried in a core fashion down to the macular surface where the macular surface was thrown into folds.  The 30-degree prismatic lens was used for viewing of the mid periphery and surface proliferation was removed from the retinal surface for 360 degrees.  The vitrectomy was carried into the far periphery and vitreous was removed down to the vitreous base for 360 degrees.  The magnifying contact lens was then placed onto a layer of methylcellulose and onto the cornea.  The macular region became visible and folds were seen diamond-dusted membrane scraper was used to engage the epiretinal membrane.  The lighted pick was used to lift the epiretinal membrane.  The ILM forceps were used to elevate the epiretinal membrane and remove it in 1 piece across  the macula and across the superior and inferior macula.  One large piece came forth. The diamond-dusted membrane scraper was then used to engage other fibrotic membranes and remnants of the membrane.  These were elevated and removed with the vitreous cutter.  The edge of the macular hole was teased and moved into place with the diamond-dusted membrane scraper. The endolaser was positioned in the eye.  A 353 burns were placed around the retinal periphery.  The power was 300 mW 1000 microns each and 0.1 seconds each.  Total gas fluid exchange was carried out.  Sufficient time was allowed for additional fluid to track down the walls of the eye and collect in the posterior segment.  The serum patch was prepared during this time and C3F8 was prepared to a 14% concentration. Additional fluid was removed with the [place] ophthalmics brush until the macula was dry.  Serum patch was delivered.  Excess patch was removed with the [place] ophthalmics brush.  C3F8 14% was exchanged for intravitreal gas.  The instruments were removed from the eye.  A 9-0 nylon was used to close the sclerotomy site at 2 o'clock.  The conjunctiva was closed with wet-field cautery.  The 25-gauge trocars were removed.  A C3F8 14%  gas was added until the pressure was 10 with the [place] tonometer.  The wounds were tested and found to be secure. Polymyxin and ceftazidime were injected around the globe for postop pain. Decadron 10 mg was injected to the lower subconjunctival space. Marcaine was injected around the globe for postop pain.  Closing pressure was 10 with a [place] tonometer.  Polysporin ophthalmic ointment and patching shield were placed.  The patient was awakened, taken to recovery in satisfactory condition.  COMPLICATIONS:  None.  DURATION:  1 hour 30 minutes.     Chrystie Nose. Zigmund Daniel, M.D.     JDM/MEDQ  D:  08/01/2016  T:  08/02/2016  Job:  [id number]

## 2016-08-02 NOTE — Progress Notes (Signed)
Valentino Nose to be D/C'd  per MD order. Discussed with the patient and all questions fully answered.  VSS, Skin clean, dry and intact without evidence of skin break down, no evidence of skin tears noted.  IV catheter discontinued intact. Site without signs and symptoms of complications. Dressing and pressure applied.  An After Visit Summary was printed and given to the patient. Patient received prescription.  D/c education completed with patient/family including follow up instructions, medication list, d/c activities limitations if indicated, with other d/c instructions as indicated by MD - patient able to verbalize understanding, all questions fully answered.   Patient instructed to return to ED, call 911, or call MD for any changes in condition.   Patient to be escorted via [place], and D/C home via private auto.

## 2016-08-02 NOTE — Discharge Summary (Signed)
Discharge summary not needed on [facility] patients per medical records. 

## 2016-08-02 NOTE — Progress Notes (Signed)
08/02/2016, 6:26 AM  Mental Status:  Awake, Alert, Oriented  Anterior segment: Cornea  Clear    Anterior Chamber Blood spot    Lens:   Cataract  Intra Ocular Pressure 23 mmHg with Tonopen  Vitreous: Clear 95%gas bubble   Retina:  Attached Good laser reaction   Impression: Excellent result Retina attached   Final Diagnosis: Active Problems:   Macular hole, right   Preretinal fibrosis, right eye   Plan: start post operative eye drops.   Add Alphagan.   Discharge to home.  Give post operative instructions  Hayden Pedro 08/02/2016, 6:26 AM

## 2016-08-08 ENCOUNTER — Encounter (INDEPENDENT_AMBULATORY_CARE_PROVIDER_SITE_OTHER): Payer: PRIVATE HEALTH INSURANCE | Admitting: Ophthalmology

## 2016-08-08 DIAGNOSIS — H35341 Macular cyst, hole, or pseudohole, right eye: Secondary | ICD-10-CM

## 2016-08-28 ENCOUNTER — Encounter (INDEPENDENT_AMBULATORY_CARE_PROVIDER_SITE_OTHER): Payer: PRIVATE HEALTH INSURANCE | Admitting: Ophthalmology

## 2016-08-28 DIAGNOSIS — H35371 Puckering of macula, right eye: Secondary | ICD-10-CM

## 2016-08-30 ENCOUNTER — Other Ambulatory Visit: Payer: Self-pay | Admitting: Cardiology

## 2016-08-30 NOTE — Telephone Encounter (Signed)
Rx has been sent to the pharmacy electronically. ° °

## 2016-10-28 ENCOUNTER — Other Ambulatory Visit: Payer: Self-pay | Admitting: Cardiology

## 2016-11-06 ENCOUNTER — Encounter (INDEPENDENT_AMBULATORY_CARE_PROVIDER_SITE_OTHER): Payer: PRIVATE HEALTH INSURANCE | Admitting: Ophthalmology

## 2016-11-06 DIAGNOSIS — E10319 Type 1 diabetes mellitus with unspecified diabetic retinopathy without macular edema: Secondary | ICD-10-CM | POA: Diagnosis not present

## 2016-11-06 DIAGNOSIS — H35033 Hypertensive retinopathy, bilateral: Secondary | ICD-10-CM | POA: Diagnosis not present

## 2016-11-06 DIAGNOSIS — H43813 Vitreous degeneration, bilateral: Secondary | ICD-10-CM

## 2016-11-06 DIAGNOSIS — H2513 Age-related nuclear cataract, bilateral: Secondary | ICD-10-CM

## 2016-11-06 DIAGNOSIS — I10 Essential (primary) hypertension: Secondary | ICD-10-CM | POA: Diagnosis not present

## 2016-11-06 DIAGNOSIS — E103593 Type 1 diabetes mellitus with proliferative diabetic retinopathy without macular edema, bilateral: Secondary | ICD-10-CM | POA: Diagnosis not present

## 2017-01-03 ENCOUNTER — Encounter (INDEPENDENT_AMBULATORY_CARE_PROVIDER_SITE_OTHER): Payer: PRIVATE HEALTH INSURANCE | Admitting: Ophthalmology

## 2017-05-07 ENCOUNTER — Ambulatory Visit (INDEPENDENT_AMBULATORY_CARE_PROVIDER_SITE_OTHER): Payer: PRIVATE HEALTH INSURANCE | Admitting: Ophthalmology

## 2017-07-19 ENCOUNTER — Encounter (INDEPENDENT_AMBULATORY_CARE_PROVIDER_SITE_OTHER): Payer: PRIVATE HEALTH INSURANCE | Admitting: Ophthalmology

## 2017-07-22 ENCOUNTER — Other Ambulatory Visit: Payer: Self-pay | Admitting: Cardiology

## 2017-07-23 HISTORY — PX: CATARACT EXTRACTION: SUR2

## 2017-07-27 ENCOUNTER — Other Ambulatory Visit: Payer: Self-pay | Admitting: Cardiology

## 2017-07-27 NOTE — Telephone Encounter (Signed)
REFILL 

## 2017-07-30 ENCOUNTER — Telehealth: Payer: Self-pay | Admitting: Cardiology

## 2017-07-30 MED ORDER — ATORVASTATIN CALCIUM 40 MG PO TABS: 80.0000 mg | ORAL_TABLET | Freq: Every day | ORAL | 0 refills | Status: DC

## 2017-07-30 NOTE — Telephone Encounter (Signed)
Rx(s) sent to pharmacy electronically. Did not authorize 90 day supply as patient is due for an appt

## 2017-07-30 NOTE — Telephone Encounter (Signed)
email sent to pt to call and schedule a follow up appt for future refills

## 2017-07-30 NOTE — Telephone Encounter (Signed)
New message   *STAT* If patient is at the pharmacy, call can be transferred to refill team.   1. Which medications need to be refilled? (please list name of each medication and dose if known) Atorvastatin. 40mg    2. Which pharmacy/location (including street and city if local pharmacy) is medication to be sent to? [facility]   3. Do they need a 30 day or 90 day supply? [place]

## 2017-08-13 ENCOUNTER — Encounter: Payer: Self-pay | Admitting: Gastroenterology

## 2017-08-29 ENCOUNTER — Other Ambulatory Visit: Payer: Self-pay | Admitting: Cardiology

## 2017-08-29 NOTE — Telephone Encounter (Signed)
REFILL 

## 2017-08-30 ENCOUNTER — Other Ambulatory Visit: Payer: Self-pay | Admitting: Cardiology

## 2017-09-10 DIAGNOSIS — E785 Hyperlipidemia, unspecified: Secondary | ICD-10-CM | POA: Insufficient documentation

## 2017-09-10 DIAGNOSIS — E039 Hypothyroidism, unspecified: Secondary | ICD-10-CM | POA: Insufficient documentation

## 2017-09-10 DIAGNOSIS — E109 Type 1 diabetes mellitus without complications: Secondary | ICD-10-CM | POA: Insufficient documentation

## 2017-09-10 DIAGNOSIS — I2510 Atherosclerotic heart disease of native coronary artery without angina pectoris: Secondary | ICD-10-CM | POA: Insufficient documentation

## 2017-09-10 DIAGNOSIS — R0600 Dyspnea, unspecified: Secondary | ICD-10-CM | POA: Insufficient documentation

## 2017-09-10 DIAGNOSIS — Z8489 Family history of other specified conditions: Secondary | ICD-10-CM | POA: Insufficient documentation

## 2017-09-12 ENCOUNTER — Encounter (INDEPENDENT_AMBULATORY_CARE_PROVIDER_SITE_OTHER): Payer: PRIVATE HEALTH INSURANCE | Admitting: Ophthalmology

## 2017-09-12 DIAGNOSIS — I10 Essential (primary) hypertension: Secondary | ICD-10-CM

## 2017-09-12 DIAGNOSIS — E10319 Type 1 diabetes mellitus with unspecified diabetic retinopathy without macular edema: Secondary | ICD-10-CM | POA: Diagnosis not present

## 2017-09-12 DIAGNOSIS — H43813 Vitreous degeneration, bilateral: Secondary | ICD-10-CM

## 2017-09-12 DIAGNOSIS — H35033 Hypertensive retinopathy, bilateral: Secondary | ICD-10-CM

## 2017-09-12 DIAGNOSIS — E103593 Type 1 diabetes mellitus with proliferative diabetic retinopathy without macular edema, bilateral: Secondary | ICD-10-CM | POA: Diagnosis not present

## 2017-09-12 DIAGNOSIS — H4313 Vitreous hemorrhage, bilateral: Secondary | ICD-10-CM | POA: Diagnosis not present

## 2017-09-20 NOTE — Progress Notes (Signed)
HPI The patient presents for followup of his coronary disease. He is status post multivessel PCI.  He did have a stress test in 2017.  The patient denies any new symptoms such as chest discomfort, neck or arm discomfort. There has been no new shortness of breath, PND or orthopnea. There have been no reported palpitations, presyncope or syncope.  he has had bronchitis and eye problems and so has not been exercising as much.  With his activities however he denies cardiovascular symptoms.     Allergies  Allergen Reactions  . No Known Allergies     Current Outpatient Medications  Medication Sig Dispense Refill  . ACCU-CHEK AVIVA PLUS test strip 1 each by Other route as needed.     Marland Kitchen albuterol (PROVENTIL HFA;VENTOLIN HFA) 108 (90 Base) MCG/ACT inhaler Inhale 1 puff into the lungs every 6 (six) hours as needed for wheezing or shortness of breath.    Marland Kitchen aspirin 81 MG tablet Take 81 mg by mouth daily.    Marland Kitchen atorvastatin (LIPITOR) 80 MG tablet Take 1 tablet (80 mg total) daily at 6 PM by mouth. KEEP OV. 30 tablet 0  . Coenzyme Q10 200 MG capsule Take 200 mg by mouth daily.    . insulin aspart (NOVOLOG) 100 UNIT/ML injection Inject 1 Units into the skin as directed. 1 unit per 5 carbs Per sliding scale    . insulin degludec (TRESIBA FLEXTOUCH) 100 UNIT/ML SOPN FlexTouch Pen Inject 30 Units into the skin every morning.    Marland Kitchen losartan (COZAAR) 25 MG tablet TAKE 1 TABLET EVERY DAY 90 tablet 3  . nitroGLYCERIN (NITROSTAT) 0.4 MG SL tablet PLACE 1 TABLET UNDER THE TONGUE EVERY 5 (FIVE) MINUTES AS NEEDED FOR CHEST PAIN AS DIRECTED 25 tablet 8   No current facility-administered medications for this visit.     Past Medical History:  Diagnosis Date  . Coronary artery disease    s/p NSTEMI after ETT-echo - [facility] 7/13:  LM < 10%, pLAD 90%, mLAD 95%, pCFX tandem 90%, mCFX 80-90%, pRCA 30%, mRCA 40-50%;  PCI: Xience DES x 2 to prox and mid LAD; Xience DES x 1 to prox and mid CFX  . Dyspnea   . Family history  of anesthesia complication    nausea   . HLD (hyperlipidemia)   . Hypertension   . Hypothyroidism    not on any meds for at least 8 months.  . Lung nodule    CT 7/13: 3 mm RML nodule - felt to be stable and benign  . Type I diabetes mellitus (HCC)    insulin dependent    Past Surgical History:  Procedure Laterality Date  . [place] VITRECTOMY WITH 20 GAUGE MVR PORT FOR MACULAR HOLE Right 08/01/2016   25 GAUGE PARS PLANA VITRECTOMY WITH 20 GAUGE MVR PORT FOR MACULAR HOLE, MEMBRANE PEEL AND SERUM PATCH WITH ENDOLASER AND C3F8   . 25 GAUGE PARS PLANA VITRECTOMY WITH 20 GAUGE MVR PORT FOR MACULAR HOLE Right 08/01/2016   Procedure: 25 GAUGE PARS PLANA VITRECTOMY WITH 20 GAUGE MVR PORT FOR MACULAR HOLE, MEMBRANE PEEL AND SERUM PATCH WITH ENDOLASER AND C3F8;  Surgeon: Hayden Pedro, MD;  Location: [facility];  Service: Ophthalmology;  Laterality: Right;  . APPENDECTOMY  1980's  . CHOLECYSTECTOMY  ~ 2006  . CORONARY ANGIOPLASTY WITH STENT PLACEMENT  04/2013   LAD  . GAS/FLUID EXCHANGE Right 09/16/2013   Procedure: AIR/FLUID EXCHANGE;  Surgeon: Hayden Pedro, MD;  Location: [facility];  Service: Ophthalmology;  Laterality: Right;  . GAS/FLUID EXCHANGE Right 08/01/2016   Procedure: GAS/FLUID EXCHANGE;  Surgeon: Hayden Pedro, MD;  Location: [facility];  Service: Ophthalmology;  Laterality: Right;  . KNEE ARTHROSCOPY Right ~ 1984  . LEFT HEART CATHETERIZATION WITH CORONARY ANGIOGRAM N/A 05/03/2012   Procedure: LEFT HEART CATHETERIZATION WITH CORONARY ANGIOGRAM;  Surgeon: Peter M Martinique, MD;  Location: [facility];  Service: Cardiovascular;  Laterality: N/A;  . MEMBRANE PEEL Right 09/16/2013   Procedure: MEMBRANE PEEL;  Surgeon: Hayden Pedro, MD;  Location: [facility];  Service: Ophthalmology;  Laterality: Right;  . PARS PLANA VITRECTOMY Right 09/16/2013   w/membrane peel/notes 11/25/20145  . PARS PLANA VITRECTOMY Right 09/16/2013   Procedure: PARS PLANA VITRECTOMY WITH 25 GAUGE;  Surgeon: Hayden Pedro, MD;  Location: [facility];  Service: Ophthalmology;  Laterality: Right;  . PHOTOCOAGULATION WITH LASER Right 09/16/2013   Procedure: PHOTOCOAGULATION WITH LASER;  Surgeon: Hayden Pedro, MD;  Location: [facility];  Service: Ophthalmology;  Laterality: Right;  ENDOLASER  . SERUM PATCH Right 08/01/2016   Procedure: SERUM PATCH;  Surgeon: Hayden Pedro, MD;  Location: [facility];  Service: Ophthalmology;  Laterality: Right;  . TONSILLECTOMY AND ADENOIDECTOMY  1970's    ROS:  As stated in the HPI and negative for all other systems.   PHYSICAL EXAM BP (!) 162/68   Pulse 62   Ht 5\' 11"  (1.803 m)   Wt 213 lb 3.2 oz (96.7 kg)   BMI 29.74 kg/m   GENERAL:  Well appearing NECK:  No jugular venous distention, waveform within normal limits, carotid upstroke brisk and symmetric, no bruits, no thyromegaly LUNGS:  Clear to auscultation bilaterally CHEST:  Unremarkable HEART:  PMI not displaced or sustained,S1 and S2 within normal limits, no S3, no S4, no clicks, no rubs, no murmurs ABD:  Flat, positive bowel sounds normal in frequency in pitch, no bruits, no rebound, no guarding, no midline pulsatile mass, no hepatomegaly, no splenomegaly EXT:  2 plus pulses throughout, no edema, no cyanosis no clubbing   EKG: Sinus rhythm, rate 62, axis within normal limits, intervals within normal limits, no acute ST-T wave changes. RSR prime V1 and V2. 09/21/2017    ASSESSMENT AND PLAN   CAD  He had a negative POET (Plain Old Exercise Treadmill) last year.  He will continue with current therapy.  I will likely do another POET (Plain Old Exercise Treadmill) next year.  Hypertension  The blood pressure is elevated today but he says that it is always well controlled at home.  No change in therapy.   Hyperlipidemia  His LDL was 41.  No change in therapy.  DM  He will talk to his endocrinologist about whether there is any role for any of the newer agents which is less likely as he has type I.

## 2017-09-21 ENCOUNTER — Encounter: Payer: Self-pay | Admitting: Cardiology

## 2017-09-21 ENCOUNTER — Ambulatory Visit: Payer: PRIVATE HEALTH INSURANCE | Admitting: Cardiology

## 2017-09-21 VITALS — BP 162/68 | HR 62 | Ht 71.0 in | Wt 213.2 lb

## 2017-09-21 DIAGNOSIS — I10 Essential (primary) hypertension: Secondary | ICD-10-CM | POA: Diagnosis not present

## 2017-09-21 DIAGNOSIS — E785 Hyperlipidemia, unspecified: Secondary | ICD-10-CM

## 2017-09-21 DIAGNOSIS — I2510 Atherosclerotic heart disease of native coronary artery without angina pectoris: Secondary | ICD-10-CM

## 2017-09-21 NOTE — Patient Instructions (Signed)
Medication Instructions:  Continue current medications  If you need a refill on your cardiac medications before your next appointment, please call your pharmacy.  Labwork: None Ordered   Testing/Procedures: None Ordered  Follow-Up: Your physician wants you to follow-up in: 1 Year. You should receive a reminder letter in the mail two months in advance. If you do not receive a letter, please call our office [phone].    Thank you for choosing [facility]!!      

## 2017-09-25 ENCOUNTER — Encounter (INDEPENDENT_AMBULATORY_CARE_PROVIDER_SITE_OTHER): Payer: PRIVATE HEALTH INSURANCE | Admitting: Ophthalmology

## 2017-09-25 ENCOUNTER — Ambulatory Visit (AMBULATORY_SURGERY_CENTER): Payer: Self-pay

## 2017-09-25 VITALS — Ht 71.0 in | Wt 213.2 lb

## 2017-09-25 DIAGNOSIS — Z1211 Encounter for screening for malignant neoplasm of colon: Secondary | ICD-10-CM

## 2017-09-25 MED ORDER — PEG-KCL-NACL-NASULF-NA ASC-C 140 G PO SOLR: 1.0000 | Freq: Once | ORAL | Status: AC

## 2017-09-25 NOTE — Progress Notes (Signed)
Per pt, no allergies to soy or egg products.Pt not taking any weight loss meds or using  O2 at home.  Pt refused emmi video. 

## 2017-09-26 ENCOUNTER — Other Ambulatory Visit: Payer: Self-pay | Admitting: Cardiology

## 2017-10-09 ENCOUNTER — Ambulatory Visit (AMBULATORY_SURGERY_CENTER): Payer: PRIVATE HEALTH INSURANCE | Admitting: Gastroenterology

## 2017-10-09 ENCOUNTER — Encounter: Payer: Self-pay | Admitting: Gastroenterology

## 2017-10-09 VITALS — BP 100/64 | HR 68 | Temp 97.8°F | Resp 14 | Ht 71.0 in | Wt 213.0 lb

## 2017-10-09 DIAGNOSIS — D122 Benign neoplasm of ascending colon: Secondary | ICD-10-CM

## 2017-10-09 DIAGNOSIS — D123 Benign neoplasm of transverse colon: Secondary | ICD-10-CM | POA: Diagnosis not present

## 2017-10-09 DIAGNOSIS — Z1212 Encounter for screening for malignant neoplasm of rectum: Secondary | ICD-10-CM

## 2017-10-09 DIAGNOSIS — Z1211 Encounter for screening for malignant neoplasm of colon: Secondary | ICD-10-CM

## 2017-10-09 MED ORDER — SODIUM CHLORIDE 0.9 % IV SOLN: 500.0000 mL | INTRAVENOUS | Status: DC

## 2017-10-09 NOTE — Progress Notes (Signed)
Pt's states no medical or surgical changes since previsit or office visit. 

## 2017-10-09 NOTE — Patient Instructions (Signed)
Handouts given on polyps and hemorrhoids   YOU HAD AN ENDOSCOPIC PROCEDURE TODAY AT THE [facility]:   Refer to the procedure report that was given to you for any specific questions about what was found during the examination.  If the procedure report does not answer your questions, please call your gastroenterologist to clarify.  If you requested that your care partner not be given the details of your procedure findings, then the procedure report has been included in a sealed envelope for you to review at your convenience later.  YOU SHOULD EXPECT: Some feelings of bloating in the abdomen. Passage of more gas than usual.  Walking can help get rid of the air that was put into your GI tract during the procedure and reduce the bloating. If you had a lower endoscopy (such as a colonoscopy or flexible sigmoidoscopy) you may notice spotting of blood in your stool or on the toilet paper. If you underwent a bowel prep for your procedure, you may not have a normal bowel movement for a few days.  Please Note:  You might notice some irritation and congestion in your nose or some drainage.  This is from the oxygen used during your procedure.  There is no need for concern and it should clear up in a day or so.  SYMPTOMS TO REPORT IMMEDIATELY:   Following lower endoscopy (colonoscopy or flexible sigmoidoscopy):  Excessive amounts of blood in the stool  Significant tenderness or worsening of abdominal pains  Swelling of the abdomen that is new, acute  Fever of 100F or higher    For urgent or emergent issues, a gastroenterologist can be reached at any hour by calling [phone].   DIET:  We do recommend a small meal at first, but then you may proceed to your regular diet.  Drink plenty of fluids but you should avoid alcoholic beverages for 24 hours.  ACTIVITY:  You should plan to take it easy for the rest of today and you should NOT DRIVE or use heavy machinery until tomorrow (because of  the sedation medicines used during the test).    FOLLOW UP: Our staff will call the number listed on your records the next business day following your procedure to check on you and address any questions or concerns that you may have regarding the information given to you following your procedure. If we do not reach you, we will leave a message.  However, if you are feeling well and you are not experiencing any problems, there is no need to return our call.  We will assume that you have returned to your regular daily activities without incident.  If any biopsies were taken you will be contacted by phone or by letter within the next 1-3 weeks.  Please call us at [phone] if you have not heard about the biopsies in 3 weeks.    SIGNATURES/CONFIDENTIALITY: You and/or your care partner have signed paperwork which will be entered into your electronic medical record.  These signatures attest to the fact that that the information above on your After Visit Summary has been reviewed and is understood.  Full responsibility of the confidentiality of this discharge information lies with you and/or your care-partner. 

## 2017-10-09 NOTE — Progress Notes (Signed)
Spontaneous respirations throughout. VSS. Resting comfortably. To PACU on room air. Report to  RN. 

## 2017-10-09 NOTE — Op Note (Signed)
[facility] Patient Name: [name] Procedure Date: 10/09/2017 9:21 AM MRN: [id number] Endoscopist: Mauri Pole , MD Age: 51 Referring MD:  Date of Birth: May 30, 1966 Gender: Male Account #: [id number] Procedure:                Colonoscopy Indications:              Screening for colorectal malignant neoplasm Medicines:                Monitored Anesthesia Care Procedure:                Pre-Anesthesia Assessment:                           - Prior to the procedure, a History and Physical                            was performed, and patient medications and                            allergies were reviewed. The patient's tolerance of                            previous anesthesia was also reviewed. The risks                            and benefits of the procedure and the sedation                            options and risks were discussed with the patient.                            All questions were answered, and informed consent                            was obtained. Prior Anticoagulants: The patient has                            taken no previous anticoagulant or antiplatelet                            agents. ASA Grade Assessment: II - A patient with                            mild systemic disease. After reviewing the risks                            and benefits, the patient was deemed in                            satisfactory condition to undergo the procedure.                           After obtaining informed consent, the colonoscope  was passed under direct vision. Throughout the                            procedure, the patient's blood pressure, pulse, and                            oxygen saturations were monitored continuously. The                            Colonoscope was introduced through the anus and                            advanced to the the cecum, identified by                            appendiceal orifice and  ileocecal valve. The                            colonoscopy was performed without difficulty. The                            patient tolerated the procedure well. The quality                            of the bowel preparation was excellent. The                            ileocecal valve, appendiceal orifice, and rectum                            were photographed. Scope In: 9:26:17 AM Scope Out: 9:45:18 AM Scope Withdrawal Time: 0 hours 13 minutes 50 seconds  Total Procedure Duration: 0 hours 19 minutes 1 second  Findings:                 The perianal and digital rectal examinations were                            normal.                           Eight sessile polyps were found in the hepatic                            flexure X1 and ascending colon X7. The polyps were                            1 to 2 mm in size. These polyps were removed with a                            cold biopsy forceps. Resection and retrieval were                            complete.  Four sessile polyps were found in the transverse                            colon. The polyps were 3 to 4 mm in size. These                            polyps were removed with a cold snare. Resection                            and retrieval were complete.                           Non-bleeding internal hemorrhoids were found during                            retroflexion. The hemorrhoids were small. Complications:            No immediate complications. Estimated Blood Loss:     Estimated blood loss was minimal. Impression:               - Eight 1 to 2 mm polyps at the hepatic flexure and                            in the ascending colon, removed with a cold biopsy                            forceps. Resected and retrieved.                           - Four 3 to 4 mm polyps in the transverse colon,                            removed with a cold snare. Resected and retrieved.                           -  Non-bleeding internal hemorrhoids. Recommendation:           - Patient has a contact number available for                            emergencies. The signs and symptoms of potential                            delayed complications were discussed with the                            patient. Return to normal activities tomorrow.                            Written discharge instructions were provided to the                            patient.                           -  Resume previous diet.                           - Continue present medications.                           - Await pathology results.                           - Repeat colonoscopy date to be determined after                            pending pathology results are reviewed for                            surveillance based on pathology results. Mauri Pole, MD 10/09/2017 9:54:00 AM This report has been signed electronically.

## 2017-10-09 NOTE — Progress Notes (Signed)
Called to room to assist during endoscopic procedure.  Patient ID and intended procedure confirmed with present staff. Received instructions for my participation in the procedure from the performing physician.  

## 2017-10-10 ENCOUNTER — Telehealth: Payer: Self-pay | Admitting: *Deleted

## 2017-10-10 NOTE — Telephone Encounter (Signed)
  Follow up Call-  Call back number 10/09/2017  Post procedure Call Back phone  # [phone]  Permission to leave phone message Yes  Some recent data might be hidden     Patient questions:  Do you have a fever, pain , or abdominal swelling? No. Pain Score  0 *  Have you tolerated food without any problems? Yes.    Have you been able to return to your normal activities? Yes.    Do you have any questions about your discharge instructions: Diet   No. Medications  No. Follow up visit  No.  Do you have questions or concerns about your Care? No.  Actions: * If pain score is 4 or above: No action needed, pain <4.

## 2017-10-19 ENCOUNTER — Encounter: Payer: Self-pay | Admitting: Gastroenterology

## 2017-10-19 ENCOUNTER — Telehealth: Payer: Self-pay | Admitting: Gastroenterology

## 2017-10-19 NOTE — Telephone Encounter (Signed)
Called back. Voicemail. Left a message that I was returning his call.

## 2017-10-19 NOTE — Telephone Encounter (Signed)
Spoke with [name]. A copy of the report will be mailed. Understands there will be a separate letter that will come from Dr Silverio Decamp with her recommendations.

## 2017-11-13 ENCOUNTER — Encounter (INDEPENDENT_AMBULATORY_CARE_PROVIDER_SITE_OTHER): Payer: PRIVATE HEALTH INSURANCE | Admitting: Ophthalmology

## 2017-11-13 DIAGNOSIS — E10311 Type 1 diabetes mellitus with unspecified diabetic retinopathy with macular edema: Secondary | ICD-10-CM | POA: Diagnosis not present

## 2017-11-13 DIAGNOSIS — E103591 Type 1 diabetes mellitus with proliferative diabetic retinopathy without macular edema, right eye: Secondary | ICD-10-CM

## 2017-11-13 DIAGNOSIS — H4313 Vitreous hemorrhage, bilateral: Secondary | ICD-10-CM

## 2017-11-13 DIAGNOSIS — E103512 Type 1 diabetes mellitus with proliferative diabetic retinopathy with macular edema, left eye: Secondary | ICD-10-CM

## 2017-11-13 DIAGNOSIS — H2512 Age-related nuclear cataract, left eye: Secondary | ICD-10-CM | POA: Diagnosis not present

## 2017-11-13 DIAGNOSIS — H35033 Hypertensive retinopathy, bilateral: Secondary | ICD-10-CM

## 2017-11-13 DIAGNOSIS — H43812 Vitreous degeneration, left eye: Secondary | ICD-10-CM | POA: Diagnosis not present

## 2017-11-13 DIAGNOSIS — I10 Essential (primary) hypertension: Secondary | ICD-10-CM

## 2018-05-13 ENCOUNTER — Encounter (INDEPENDENT_AMBULATORY_CARE_PROVIDER_SITE_OTHER): Payer: PRIVATE HEALTH INSURANCE | Admitting: Ophthalmology

## 2018-06-10 ENCOUNTER — Encounter (INDEPENDENT_AMBULATORY_CARE_PROVIDER_SITE_OTHER): Payer: PRIVATE HEALTH INSURANCE | Admitting: Ophthalmology

## 2018-07-18 ENCOUNTER — Encounter (INDEPENDENT_AMBULATORY_CARE_PROVIDER_SITE_OTHER): Payer: PRIVATE HEALTH INSURANCE | Admitting: Ophthalmology

## 2018-07-18 DIAGNOSIS — E103593 Type 1 diabetes mellitus with proliferative diabetic retinopathy without macular edema, bilateral: Secondary | ICD-10-CM

## 2018-07-18 DIAGNOSIS — H35371 Puckering of macula, right eye: Secondary | ICD-10-CM

## 2018-07-18 DIAGNOSIS — H2512 Age-related nuclear cataract, left eye: Secondary | ICD-10-CM

## 2018-07-18 DIAGNOSIS — I10 Essential (primary) hypertension: Secondary | ICD-10-CM | POA: Diagnosis not present

## 2018-07-18 DIAGNOSIS — H43812 Vitreous degeneration, left eye: Secondary | ICD-10-CM

## 2018-07-18 DIAGNOSIS — E10319 Type 1 diabetes mellitus with unspecified diabetic retinopathy without macular edema: Secondary | ICD-10-CM

## 2018-07-18 DIAGNOSIS — H4312 Vitreous hemorrhage, left eye: Secondary | ICD-10-CM

## 2018-07-18 DIAGNOSIS — H35033 Hypertensive retinopathy, bilateral: Secondary | ICD-10-CM

## 2018-07-18 DIAGNOSIS — H26491 Other secondary cataract, right eye: Secondary | ICD-10-CM

## 2018-09-04 ENCOUNTER — Other Ambulatory Visit: Payer: Self-pay | Admitting: Cardiology

## 2018-09-14 ENCOUNTER — Other Ambulatory Visit: Payer: Self-pay | Admitting: Cardiology

## 2018-10-04 ENCOUNTER — Other Ambulatory Visit: Payer: Self-pay | Admitting: Cardiology

## 2019-01-01 ENCOUNTER — Other Ambulatory Visit: Payer: Self-pay | Admitting: *Deleted

## 2019-01-01 MED ORDER — ATORVASTATIN CALCIUM 80 MG PO TABS: 80.0000 mg | ORAL_TABLET | Freq: Every day | ORAL | 0 refills | Status: DC

## 2019-01-17 ENCOUNTER — Encounter (INDEPENDENT_AMBULATORY_CARE_PROVIDER_SITE_OTHER): Payer: PRIVATE HEALTH INSURANCE | Admitting: Ophthalmology

## 2019-01-24 ENCOUNTER — Other Ambulatory Visit: Payer: Self-pay | Admitting: Cardiology

## 2019-01-25 ENCOUNTER — Other Ambulatory Visit: Payer: Self-pay | Admitting: Cardiology

## 2019-02-05 ENCOUNTER — Encounter (INDEPENDENT_AMBULATORY_CARE_PROVIDER_SITE_OTHER): Payer: PRIVATE HEALTH INSURANCE | Admitting: Ophthalmology

## 2019-02-15 ENCOUNTER — Telehealth: Payer: Self-pay | Admitting: Cardiology

## 2019-02-15 NOTE — Telephone Encounter (Signed)
LVMTCB to schedule from appt from recall list with Dr. Hochrein.  °

## 2019-02-28 NOTE — Telephone Encounter (Signed)
LMTCB to schedule appt from recall list with Dr Hochrein °

## 2019-03-13 ENCOUNTER — Encounter (INDEPENDENT_AMBULATORY_CARE_PROVIDER_SITE_OTHER): Payer: PRIVATE HEALTH INSURANCE | Admitting: Ophthalmology

## 2019-04-22 ENCOUNTER — Telehealth: Payer: Self-pay | Admitting: *Deleted

## 2019-04-22 NOTE — Telephone Encounter (Signed)
A message was left, re: follow up visit. 

## 2019-05-07 ENCOUNTER — Encounter (INDEPENDENT_AMBULATORY_CARE_PROVIDER_SITE_OTHER): Payer: PRIVATE HEALTH INSURANCE | Admitting: Ophthalmology

## 2019-05-20 ENCOUNTER — Other Ambulatory Visit: Payer: Self-pay | Admitting: Cardiology

## 2019-06-16 ENCOUNTER — Other Ambulatory Visit: Payer: Self-pay | Admitting: Cardiology

## 2019-06-17 ENCOUNTER — Other Ambulatory Visit: Payer: Self-pay

## 2019-06-17 ENCOUNTER — Encounter (INDEPENDENT_AMBULATORY_CARE_PROVIDER_SITE_OTHER): Payer: PRIVATE HEALTH INSURANCE | Admitting: Ophthalmology

## 2019-06-17 DIAGNOSIS — I10 Essential (primary) hypertension: Secondary | ICD-10-CM | POA: Diagnosis not present

## 2019-06-17 DIAGNOSIS — E10319 Type 1 diabetes mellitus with unspecified diabetic retinopathy without macular edema: Secondary | ICD-10-CM | POA: Diagnosis not present

## 2019-06-17 DIAGNOSIS — E103593 Type 1 diabetes mellitus with proliferative diabetic retinopathy without macular edema, bilateral: Secondary | ICD-10-CM

## 2019-06-17 DIAGNOSIS — H35033 Hypertensive retinopathy, bilateral: Secondary | ICD-10-CM

## 2019-06-17 DIAGNOSIS — H35371 Puckering of macula, right eye: Secondary | ICD-10-CM

## 2019-06-17 DIAGNOSIS — H43813 Vitreous degeneration, bilateral: Secondary | ICD-10-CM

## 2019-07-11 ENCOUNTER — Other Ambulatory Visit: Payer: Self-pay | Admitting: Cardiology

## 2019-07-31 ENCOUNTER — Other Ambulatory Visit: Payer: Self-pay | Admitting: Cardiology

## 2019-07-31 NOTE — Telephone Encounter (Signed)
Pt overdue for 1 yr f/u. Please contact pt for future appointment.

## 2019-08-25 ENCOUNTER — Other Ambulatory Visit: Payer: Self-pay | Admitting: Cardiology

## 2019-08-25 MED ORDER — ATORVASTATIN CALCIUM 80 MG PO TABS: 80.0000 mg | ORAL_TABLET | Freq: Every day | ORAL | 0 refills | Status: DC

## 2019-08-25 NOTE — Telephone Encounter (Signed)
°*  STAT* If patient is at the pharmacy, call can be transferred to refill team.   1. Which medications need to be refilled? (please list name of each medication and dose if known)  atorvastatin (LIPITOR) 80 MG tablet  2. Which pharmacy/location (including street and city if local pharmacy) is medication to be sent to?  [facility] [address], [place]  3. Do they need a 30 day or 90 day supply? 30  Pt has an office visit scheduled for 09/25/19

## 2019-09-24 DIAGNOSIS — E785 Hyperlipidemia, unspecified: Secondary | ICD-10-CM | POA: Insufficient documentation

## 2019-09-24 NOTE — H&P (View-Only) (Signed)
Cardiology Office Note   Date:  09/25/2019   ID:  SUTTER LOMBARDOZZI, DOB [birth year], MRN [id number]  PCP:  Maris Berger, MD  Cardiologist:   Minus Breeding, MD   Chief Complaint  Patient presents with  . Coronary Artery Disease      History of Present Illness: [name] is a 53 y.o. male who presents for followup of his coronary disease. He is status post multivessel PCI.  He did have a stress test in 2017.  He has gained a little weight since I saw him.  He is not been quite as active.  He does have some tiredness.  He is not having any chest discomfort but he never really had symptoms previously.  His exercise tolerance might not be quite as good as it was.  He is not having any substernal chest pressure, neck or arm discomfort.  He is not having any palpitations, presyncope or syncope.  His blood sugars been elevated but they are working on bringing it down.  Past Medical History:  Diagnosis Date  . Coronary artery disease    s/p NSTEMI after ETT-echo - LHC 7/13:  LM < 10%, pLAD 90%, mLAD 95%, pCFX tandem 90%, mCFX 80-90%, pRCA 30%, mRCA 40-50%;  PCI: Xience DES x 2 to prox and mid LAD; Xience DES x 1 to prox and mid CFX  . Dyspnea   . Family history of anesthesia complication    nausea   . HLD (hyperlipidemia)   . Hypertension   . Hypothyroidism    not on any meds for at least 8 months.  . Lung nodule 2013   CT 7/13: 3 mm RML nodule - felt to be stable and benign/ follow up in 2014  . Myocardial infarction [facility]) 2013   have 4 stents  . Type I diabetes mellitus (HCC)    insulin dependent    Past Surgical History:  Procedure Laterality Date  . [place] VITRECTOMY WITH 20 GAUGE MVR PORT FOR MACULAR HOLE Right 08/01/2016   25 GAUGE PARS PLANA VITRECTOMY WITH 20 GAUGE MVR PORT FOR MACULAR HOLE, MEMBRANE PEEL AND SERUM PATCH WITH ENDOLASER AND C3F8   . 25 GAUGE PARS PLANA VITRECTOMY WITH 20 GAUGE MVR PORT FOR MACULAR HOLE Right 08/01/2016   Procedure: 25  GAUGE PARS PLANA VITRECTOMY WITH 20 GAUGE MVR PORT FOR MACULAR HOLE, MEMBRANE PEEL AND SERUM PATCH WITH ENDOLASER AND C3F8;  Surgeon: Hayden Pedro, MD;  Location: [place];  Service: Ophthalmology;  Laterality: Right;  . APPENDECTOMY  1980's  . CATARACT EXTRACTION  07/2017   right eye  . CHOLECYSTECTOMY  ~ 2006  . CORONARY ANGIOPLASTY WITH STENT PLACEMENT  04/2013   LAD  . GAS/FLUID EXCHANGE Right 09/16/2013   Procedure: AIR/FLUID EXCHANGE;  Surgeon: Hayden Pedro, MD;  Location: [place];  Service: Ophthalmology;  Laterality: Right;  . GAS/FLUID EXCHANGE Right 08/01/2016   Procedure: GAS/FLUID EXCHANGE;  Surgeon: Hayden Pedro, MD;  Location: [place];  Service: Ophthalmology;  Laterality: Right;  . KNEE ARTHROSCOPY Right ~ 1984  . LEFT HEART CATHETERIZATION WITH CORONARY ANGIOGRAM N/A 05/03/2012   Procedure: LEFT HEART CATHETERIZATION WITH CORONARY ANGIOGRAM;  Surgeon: Peter M Martinique, MD;  Location: [facility] CATH LAB;  Service: Cardiovascular;  Laterality: N/A;  . MEMBRANE PEEL Right 09/16/2013   Procedure: MEMBRANE PEEL;  Surgeon: Hayden Pedro, MD;  Location: [place];  Service: Ophthalmology;  Laterality: Right;  . PARS PLANA VITRECTOMY Right 09/16/2013  w/membrane peel/notes 11/25/20145  . PARS PLANA VITRECTOMY Right 09/16/2013   Procedure: PARS PLANA VITRECTOMY WITH 25 GAUGE;  Surgeon: Hayden Pedro, MD;  Location: [place];  Service: Ophthalmology;  Laterality: Right;  . PHOTOCOAGULATION WITH LASER Right 09/16/2013   Procedure: PHOTOCOAGULATION WITH LASER;  Surgeon: Hayden Pedro, MD;  Location: [place];  Service: Ophthalmology;  Laterality: Right;  ENDOLASER  . SERUM PATCH Right 08/01/2016   Procedure: SERUM PATCH;  Surgeon: Hayden Pedro, MD;  Location: [place];  Service: Ophthalmology;  Laterality: Right;  . TONSILLECTOMY AND ADENOIDECTOMY  1970's     Current Outpatient Medications  Medication Sig Dispense Refill  . ACCU-CHEK AVIVA PLUS test strip 1 each by Other route as needed.     Marland Kitchen  ADVAIR DISKUS 250-50 MCG/DOSE AEPB     . albuterol (PROVENTIL HFA;VENTOLIN HFA) 108 (90 Base) MCG/ACT inhaler Inhale 1 puff into the lungs every 6 (six) hours as needed for wheezing or shortness of breath.    Marland Kitchen aspirin 81 MG tablet Take 81 mg by mouth daily.    Marland Kitchen azelastine (ASTELIN) 0.1 % nasal spray Place into the nose.    . Coenzyme Q10 200 MG capsule Take 200 mg by mouth daily.    . Continuous Blood Gluc Sensor (DEXCOM G6 SENSOR) MISC     . insulin aspart (NOVOLOG) 100 UNIT/ML injection Inject 1 Units into the skin as directed. 1 unit per 4 grams  Per sliding scale    . insulin degludec (TRESIBA FLEXTOUCH) 100 UNIT/ML SOPN FlexTouch Pen Inject 38 Units into the skin every morning.     Marland Kitchen losartan (COZAAR) 25 MG tablet TAKE 1 TABLET EVERY DAY 30 tablet 11  . nitroGLYCERIN (NITROSTAT) 0.4 MG SL tablet PLACE 1 TABLET UNDER THE TONGUE EVERY 5 (FIVE) MINUTES AS NEEDED FOR CHEST PAIN AS DIRECTED 25 tablet 8  . omeprazole (PRILOSEC) 20 MG capsule     . Semaglutide, 1 MG/DOSE, (OZEMPIC, 1 MG/DOSE,) 2 MG/1.5ML SOPN Inject into the skin.    . rosuvastatin (CRESTOR) 40 MG tablet Take 1 tablet (40 mg total) by mouth daily at 6 PM. 90 tablet 3   No current facility-administered medications for this visit.     Allergies:   No known allergies    ROS:  Please see the history of present illness.   Otherwise, review of systems are positive for none.   All other systems are reviewed and negative.    PHYSICAL EXAM: VS:  BP (!) 143/78   Pulse 82   Ht 5\' 11"  (1.803 m)   Wt 233 lb 9.6 oz (106 kg)   BMI 32.58 kg/m  , BMI Body mass index is 32.58 kg/m. GENERAL:  Well appearing NECK:  No jugular venous distention, waveform within normal limits, carotid upstroke brisk and symmetric, no bruits, no thyromegaly LUNGS:  Clear to auscultation bilaterally CHEST:  Unremarkable HEART:  PMI not displaced or sustained,S1 and S2 within normal limits, no S3, no S4, no clicks, no rubs, no murmurs ABD:  Flat,  positive bowel sounds normal in frequency in pitch, no bruits, no rebound, no guarding, no midline pulsatile mass, no hepatomegaly, no splenomegaly EXT:  2 plus pulses throughout, no edema, no cyanosis no clubbing    EKG:  EKG is ordered today. The ekg ordered today demonstrates sinus rhythm, rate 84, axis within normal limits, intervals within normal limits, no acute ST-T wave changes.   Recent Labs: No results found for requested labs within last 8760 hours.  Wt Readings from Last 3 Encounters:  09/25/19 233 lb 9.6 oz (106 kg)  10/09/17 213 lb (96.6 kg)  09/25/17 213 lb 3.2 oz (96.7 kg)      Other studies Reviewed: Additional studies/ records that were reviewed today include: Labs. Review of the above records demonstrates:  Please see elsewhere in the note.     ASSESSMENT AND PLAN:  CAD  He had a negative POET (Plain Old Exercise Treadmill) in 2017.   However, given the fact that he has a slightly decreased exercise tolerance, ongoing risk factors and no significant symptoms previously and we will bring him back to screen him with a POET (Plain Old Exercise Treadmill)  Hypertension  The blood pressure is  slightly elevated but he says this is unusual.  He will keep an eye on this and no change in therapy.   Hyperlipidemia  His LDL was not quite at target.  The LDL was 86.  I will switch him to Crestor 40 mg daily stopping the Lipitor.  He can go to his primary provider and get a lipid profile liver enzymes in 10 weeks with a goal of LDL less than 70.   DM  A1C was 8.6 most recently.  However, he has had his meds adjusted by his primary provider and he says that it is coming down.  COVID EDUCATION We discussed the vaccine and other questions.  Current medicines are reviewed at length with the patient today.  The patient does not have concerns regarding medicines.  The following changes have been made:  As above  Labs/ tests ordered today include:   Orders Placed  This Encounter  Procedures  . EXERCISE TOLERANCE TEST (ETT)  . EKG 12-Lead     Disposition:   FU with me in one year or sooner if needed.  Approximately 1 year but sooner if needed   Signed, Minus Breeding, MD  09/25/2019 8:53 AM    [place]

## 2019-09-24 NOTE — Progress Notes (Signed)
Cardiology Office Note   Date:  09/25/2019   ID:  [name], DOB 05/06/1966, MRN [id number]  PCP:  Maris Berger, MD  Cardiologist:   Minus Breeding, MD   Chief Complaint  Patient presents with  . Coronary Artery Disease      History of Present Illness: [name] is a 53 y.o. male who presents for followup of his coronary disease. He is status post multivessel PCI.  He did have a stress test in 2017.  He has gained a little weight since I saw him.  He is not been quite as active.  He does have some tiredness.  He is not having any chest discomfort but he never really had symptoms previously.  His exercise tolerance might not be quite as good as it was.  He is not having any substernal chest pressure, neck or arm discomfort.  He is not having any palpitations, presyncope or syncope.  His blood sugars been elevated but they are working on bringing it down.  Past Medical History:  Diagnosis Date  . Coronary artery disease    s/p NSTEMI after ETT-echo - LHC 7/13:  LM < 10%, pLAD 90%, mLAD 95%, pCFX tandem 90%, mCFX 80-90%, pRCA 30%, mRCA 40-50%;  PCI: Xience DES x 2 to prox and mid LAD; Xience DES x 1 to prox and mid CFX  . Dyspnea   . Family history of anesthesia complication    nausea   . HLD (hyperlipidemia)   . Hypertension   . Hypothyroidism    not on any meds for at least 8 months.  . Lung nodule 2013   CT 7/13: 3 mm RML nodule - felt to be stable and benign/ follow up in 2014  . Myocardial infarction [facility]) 2013   have 4 stents  . Type I diabetes mellitus (HCC)    insulin dependent    Past Surgical History:  Procedure Laterality Date  . [place] VITRECTOMY WITH 20 GAUGE MVR PORT FOR MACULAR HOLE Right 08/01/2016   25 GAUGE PARS PLANA VITRECTOMY WITH 20 GAUGE MVR PORT FOR MACULAR HOLE, MEMBRANE PEEL AND SERUM PATCH WITH ENDOLASER AND C3F8   . 25 GAUGE PARS PLANA VITRECTOMY WITH 20 GAUGE MVR PORT FOR MACULAR HOLE Right 08/01/2016   Procedure: 25  GAUGE PARS PLANA VITRECTOMY WITH 20 GAUGE MVR PORT FOR MACULAR HOLE, MEMBRANE PEEL AND SERUM PATCH WITH ENDOLASER AND C3F8;  Surgeon: Hayden Pedro, MD;  Location: [place];  Service: Ophthalmology;  Laterality: Right;  . APPENDECTOMY  1980's  . CATARACT EXTRACTION  07/2017   right eye  . CHOLECYSTECTOMY  ~ 2006  . CORONARY ANGIOPLASTY WITH STENT PLACEMENT  04/2013   LAD  . GAS/FLUID EXCHANGE Right 09/16/2013   Procedure: AIR/FLUID EXCHANGE;  Surgeon: Hayden Pedro, MD;  Location: [place];  Service: Ophthalmology;  Laterality: Right;  . GAS/FLUID EXCHANGE Right 08/01/2016   Procedure: GAS/FLUID EXCHANGE;  Surgeon: Hayden Pedro, MD;  Location: [place];  Service: Ophthalmology;  Laterality: Right;  . KNEE ARTHROSCOPY Right ~ 1984  . LEFT HEART CATHETERIZATION WITH CORONARY ANGIOGRAM N/A 05/03/2012   Procedure: LEFT HEART CATHETERIZATION WITH CORONARY ANGIOGRAM;  Surgeon: Peter M Martinique, MD;  Location: [facility] CATH LAB;  Service: Cardiovascular;  Laterality: N/A;  . MEMBRANE PEEL Right 09/16/2013   Procedure: MEMBRANE PEEL;  Surgeon: Hayden Pedro, MD;  Location: [place];  Service: Ophthalmology;  Laterality: Right;  . PARS PLANA VITRECTOMY Right 09/16/2013  w/membrane peel/notes 11/25/20145  . PARS PLANA VITRECTOMY Right 09/16/2013   Procedure: PARS PLANA VITRECTOMY WITH 25 GAUGE;  Surgeon: Hayden Pedro, MD;  Location: [place];  Service: Ophthalmology;  Laterality: Right;  . PHOTOCOAGULATION WITH LASER Right 09/16/2013   Procedure: PHOTOCOAGULATION WITH LASER;  Surgeon: Hayden Pedro, MD;  Location: [place];  Service: Ophthalmology;  Laterality: Right;  ENDOLASER  . SERUM PATCH Right 08/01/2016   Procedure: SERUM PATCH;  Surgeon: Hayden Pedro, MD;  Location: [place];  Service: Ophthalmology;  Laterality: Right;  . TONSILLECTOMY AND ADENOIDECTOMY  1970's     Current Outpatient Medications  Medication Sig Dispense Refill  . ACCU-CHEK AVIVA PLUS test strip 1 each by Other route as needed.     Marland Kitchen  ADVAIR DISKUS 250-50 MCG/DOSE AEPB     . albuterol (PROVENTIL HFA;VENTOLIN HFA) 108 (90 Base) MCG/ACT inhaler Inhale 1 puff into the lungs every 6 (six) hours as needed for wheezing or shortness of breath.    Marland Kitchen aspirin 81 MG tablet Take 81 mg by mouth daily.    Marland Kitchen azelastine (ASTELIN) 0.1 % nasal spray Place into the nose.    . Coenzyme Q10 200 MG capsule Take 200 mg by mouth daily.    . Continuous Blood Gluc Sensor (DEXCOM G6 SENSOR) MISC     . insulin aspart (NOVOLOG) 100 UNIT/ML injection Inject 1 Units into the skin as directed. 1 unit per 4 grams  Per sliding scale    . insulin degludec (TRESIBA FLEXTOUCH) 100 UNIT/ML SOPN FlexTouch Pen Inject 38 Units into the skin every morning.     Marland Kitchen losartan (COZAAR) 25 MG tablet TAKE 1 TABLET EVERY DAY 30 tablet 11  . nitroGLYCERIN (NITROSTAT) 0.4 MG SL tablet PLACE 1 TABLET UNDER THE TONGUE EVERY 5 (FIVE) MINUTES AS NEEDED FOR CHEST PAIN AS DIRECTED 25 tablet 8  . omeprazole (PRILOSEC) 20 MG capsule     . Semaglutide, 1 MG/DOSE, (OZEMPIC, 1 MG/DOSE,) 2 MG/1.5ML SOPN Inject into the skin.    . rosuvastatin (CRESTOR) 40 MG tablet Take 1 tablet (40 mg total) by mouth daily at 6 PM. 90 tablet 3   No current facility-administered medications for this visit.     Allergies:   No known allergies    ROS:  Please see the history of present illness.   Otherwise, review of systems are positive for none.   All other systems are reviewed and negative.    PHYSICAL EXAM: VS:  BP (!) 143/78   Pulse 82   Ht 5\' 11"  (1.803 m)   Wt 233 lb 9.6 oz (106 kg)   BMI 32.58 kg/m  , BMI Body mass index is 32.58 kg/m. GENERAL:  Well appearing NECK:  No jugular venous distention, waveform within normal limits, carotid upstroke brisk and symmetric, no bruits, no thyromegaly LUNGS:  Clear to auscultation bilaterally CHEST:  Unremarkable HEART:  PMI not displaced or sustained,S1 and S2 within normal limits, no S3, no S4, no clicks, no rubs, no murmurs ABD:  Flat,  positive bowel sounds normal in frequency in pitch, no bruits, no rebound, no guarding, no midline pulsatile mass, no hepatomegaly, no splenomegaly EXT:  2 plus pulses throughout, no edema, no cyanosis no clubbing    EKG:  EKG is ordered today. The ekg ordered today demonstrates sinus rhythm, rate 84, axis within normal limits, intervals within normal limits, no acute ST-T wave changes.   Recent Labs: No results found for requested labs within last 8760 hours.  Wt Readings from Last 3 Encounters:  09/25/19 233 lb 9.6 oz (106 kg)  10/09/17 213 lb (96.6 kg)  09/25/17 213 lb 3.2 oz (96.7 kg)      Other studies Reviewed: Additional studies/ records that were reviewed today include: Labs. Review of the above records demonstrates:  Please see elsewhere in the note.     ASSESSMENT AND PLAN:  CAD  He had a negative POET (Plain Old Exercise Treadmill) in 2017.   However, given the fact that he has a slightly decreased exercise tolerance, ongoing risk factors and no significant symptoms previously and we will bring him back to screen him with a POET (Plain Old Exercise Treadmill)  Hypertension  The blood pressure is  slightly elevated but he says this is unusual.  He will keep an eye on this and no change in therapy.   Hyperlipidemia  His LDL was not quite at target.  The LDL was 86.  I will switch him to Crestor 40 mg daily stopping the Lipitor.  He can go to his primary provider and get a lipid profile liver enzymes in 10 weeks with a goal of LDL less than 70.   DM  A1C was 8.6 most recently.  However, he has had his meds adjusted by his primary provider and he says that it is coming down.  COVID EDUCATION We discussed the vaccine and other questions.  Current medicines are reviewed at length with the patient today.  The patient does not have concerns regarding medicines.  The following changes have been made:  As above  Labs/ tests ordered today include:   Orders Placed  This Encounter  Procedures  . EXERCISE TOLERANCE TEST (ETT)  . EKG 12-Lead     Disposition:   FU with me in one year or sooner if needed.  Approximately 1 year but sooner if needed   Signed, Minus Breeding, MD  09/25/2019 8:53 AM    [place]

## 2019-09-25 ENCOUNTER — Other Ambulatory Visit: Payer: Self-pay

## 2019-09-25 ENCOUNTER — Ambulatory Visit (INDEPENDENT_AMBULATORY_CARE_PROVIDER_SITE_OTHER): Payer: Managed Care, Other (non HMO) | Admitting: Cardiology

## 2019-09-25 ENCOUNTER — Encounter: Payer: Self-pay | Admitting: Cardiology

## 2019-09-25 VITALS — BP 143/78 | HR 82 | Ht 71.0 in | Wt 233.6 lb

## 2019-09-25 DIAGNOSIS — E118 Type 2 diabetes mellitus with unspecified complications: Secondary | ICD-10-CM

## 2019-09-25 DIAGNOSIS — I10 Essential (primary) hypertension: Secondary | ICD-10-CM

## 2019-09-25 DIAGNOSIS — Z794 Long term (current) use of insulin: Secondary | ICD-10-CM

## 2019-09-25 DIAGNOSIS — I2510 Atherosclerotic heart disease of native coronary artery without angina pectoris: Secondary | ICD-10-CM

## 2019-09-25 DIAGNOSIS — E785 Hyperlipidemia, unspecified: Secondary | ICD-10-CM | POA: Diagnosis not present

## 2019-09-25 DIAGNOSIS — Z7189 Other specified counseling: Secondary | ICD-10-CM

## 2019-09-25 MED ORDER — ROSUVASTATIN CALCIUM 40 MG PO TABS: 40.0000 mg | ORAL_TABLET | Freq: Every day | ORAL | 3 refills | Status: DC

## 2019-09-25 NOTE — Patient Instructions (Addendum)
Medication Instructions:  Your physician has recommended you make the following change in your medication:   STOP TAKING YOUR LIPITOR.  START ROSUVASTATIN (CRESTOR). TAKE 40 MG BY MOUTH DAILY AT 6 PM  *If you need a refill on your cardiac medications before your next appointment, please call your pharmacy*  Lab Work: NONE. Follow up with your primary care provider in 10 weeks for lab work (lipids and enzymes) If you have labs (blood work) drawn today and your tests are completely normal, you will receive your results only by: Marland Kitchen MyChart Message (if you have MyChart) OR . A paper copy in the mail If you have any lab test that is abnormal or we need to change your treatment, we will call you to review the results.  Testing/Procedures: Your physician has requested that you have an exercise tolerance test. For further information please visit [email or web address]. Please also follow instruction sheet, as given.   Follow-Up: At [facility], you and your health needs are our priority.  As part of our continuing mission to provide you with exceptional heart care, we have created designated Provider Care Teams.  These Care Teams include your primary Cardiologist (physician) and Advanced Practice Providers (APPs -  Physician Assistants and Nurse Practitioners) who all work together to provide you with the care you need, when you need it.  Your next appointment:   12 month(s) or sooner if needed  The format for your next appointment:   In Person  Provider:   Dr. [name]

## 2019-10-03 ENCOUNTER — Telehealth (HOSPITAL_COMMUNITY): Payer: Self-pay

## 2019-10-03 NOTE — Telephone Encounter (Signed)
Encounter complete. 

## 2019-10-04 ENCOUNTER — Other Ambulatory Visit (HOSPITAL_COMMUNITY)
Admission: RE | Admit: 2019-10-04 | Discharge: 2019-10-04 | Disposition: A | Discharge status: Home / Self Care | Payer: Managed Care, Other (non HMO) | Source: Ambulatory Visit | Attending: Cardiology | Admitting: Cardiology

## 2019-10-04 DIAGNOSIS — Z20828 Contact with and (suspected) exposure to other viral communicable diseases: Secondary | ICD-10-CM | POA: Diagnosis not present

## 2019-10-04 DIAGNOSIS — Z01812 Encounter for preprocedural laboratory examination: Secondary | ICD-10-CM | POA: Insufficient documentation

## 2019-10-05 LAB — NOVEL CORONAVIRUS, NAA (HOSP ORDER, SEND-OUT TO REF LAB; TAT 18-24 HRS): SARS-CoV-2, NAA: NOT DETECTED

## 2019-10-07 ENCOUNTER — Telehealth (HOSPITAL_COMMUNITY): Payer: Self-pay

## 2019-10-07 NOTE — Telephone Encounter (Signed)
Encounter complete. 

## 2019-10-08 ENCOUNTER — Encounter (HOSPITAL_COMMUNITY): Payer: Self-pay | Admitting: *Deleted

## 2019-10-08 ENCOUNTER — Other Ambulatory Visit: Payer: Self-pay

## 2019-10-08 ENCOUNTER — Ambulatory Visit (HOSPITAL_COMMUNITY)
Admission: RE | Admit: 2019-10-08 | Discharge: 2019-10-08 | Disposition: A | Discharge status: Home / Self Care | Payer: Managed Care, Other (non HMO) | Source: Ambulatory Visit | Attending: Cardiology | Admitting: Cardiology

## 2019-10-08 DIAGNOSIS — I2510 Atherosclerotic heart disease of native coronary artery without angina pectoris: Secondary | ICD-10-CM | POA: Diagnosis not present

## 2019-10-08 LAB — EXERCISE TOLERANCE TEST
Estimated workload: 11.1 METS
Exercise duration (min): 9 min
Exercise duration (sec): 37 s
MPHR: 166 {beats}/min
Peak HR: 179 {beats}/min
Percent HR: 107 %
Rest HR: 102 {beats}/min

## 2019-10-08 NOTE — Progress Notes (Unsigned)
Abnormal ETT was reviewed by Dr. [name]. Patient was given the ok to be discharged and go home.

## 2019-10-09 ENCOUNTER — Other Ambulatory Visit: Payer: Self-pay | Admitting: Cardiology

## 2019-10-10 ENCOUNTER — Telehealth: Payer: Self-pay

## 2019-10-10 NOTE — Telephone Encounter (Signed)
Attempted call to pt to set up covid-19 test for 12/19 per [place] in anticipation of right heart cath next week on 12/22. No answer.  Contacted pt wife. Pt wife made aware of the above. COVID-19 test set for 10/11/2019 at 12:20pm at [facility]. She verbalized understanding and is aware of test site. She will make pt aware of info. Informed her that will send Pt instructions for heart cath via mychart and call Monday 12/21 to review.

## 2019-10-11 ENCOUNTER — Other Ambulatory Visit (HOSPITAL_COMMUNITY): Payer: Managed Care, Other (non HMO)

## 2019-10-11 ENCOUNTER — Other Ambulatory Visit (HOSPITAL_COMMUNITY)
Admission: RE | Admit: 2019-10-11 | Discharge: 2019-10-11 | Disposition: A | Discharge status: Home / Self Care | Payer: Managed Care, Other (non HMO) | Source: Ambulatory Visit | Attending: Cardiology | Admitting: Cardiology

## 2019-10-11 DIAGNOSIS — Z20828 Contact with and (suspected) exposure to other viral communicable diseases: Secondary | ICD-10-CM | POA: Diagnosis not present

## 2019-10-11 DIAGNOSIS — Z01812 Encounter for preprocedural laboratory examination: Secondary | ICD-10-CM | POA: Diagnosis not present

## 2019-10-11 LAB — SARS CORONAVIRUS 2 (TAT 6-24 HRS): SARS Coronavirus 2: NEGATIVE

## 2019-10-13 ENCOUNTER — Other Ambulatory Visit: Payer: Self-pay

## 2019-10-13 ENCOUNTER — Telehealth: Payer: Self-pay

## 2019-10-13 DIAGNOSIS — R9430 Abnormal result of cardiovascular function study, unspecified: Secondary | ICD-10-CM

## 2019-10-13 DIAGNOSIS — I2510 Atherosclerotic heart disease of native coronary artery without angina pectoris: Secondary | ICD-10-CM

## 2019-10-13 NOTE — Telephone Encounter (Signed)
Contacted Dr. [name] 12/21 to confim whether pt to be scheduled for left or right heart cath.  Per secure chat message from Dr. [name]: "Mr Lira is a left heart cath"  Mychart message of pt instructions sent to pt via mychart. Contacted pt wife to review the following pt instructions:   [place] [address] Dept: [phone] Loc: [place]                          10/13/2019  You are scheduled for a Cardiac Catheterization on Tuesday, December 22 with Dr. Peter Martinique.  1. Please arrive at the [facility] (Main Entrance A) at [facility]: [address] [place], [place] [address] at 5:30 AM (This time is two hours before your procedure to ensure your preparation). Free valet parking service is available.   Special note: Every effort is made to have your procedure done on time. Please understand that emergencies sometimes delay scheduled procedures.  2. Diet: Do not eat solid foods after midnight.  The patient may have clear liquids until 5am upon the day of the procedure.  3. Labs:  You will need to have blood drawn on 10/13/2019. PLEASE PROCEED TO THE NEAREST [facility] LOCATION. PLEASE TAKE YOUR ID AND INSURANCE CARD(S) WITH YOU.  Please see one of your location options below: [address], [place] [address]  4.63 mi   BASIC METABOLIC PANEL    COMPLETE BLOOD COUNT    THYROID STIMULATING HORMONE    You will need a COVID-19 test on 10/11/2019 (ALREADY COMPLETED)   4. Medication instructions in preparation for your procedure:  HOLD INSULIN LISPRO (HUMALOG) ON THE MORNING OF YOUR PROCEDURE  HOLD INSULIN DEGLUDEC (TRESIBA) ON THE MORNING OF YOUR PROCEDURE  HOLD SEMAGLUTIDE (OZEMPIC) ON THE MORNING OF YOUR PROCEDURE   On the morning of your procedure, take your Aspirin 81 MG and any morning  medicines NOT listed above.  You may use sips of water.  5. Plan for one night stay--bring personal belongings. 6. Bring a current list of your medications and current insurance cards. 7. You MUST have a responsible person to drive you home. 8. Someone MUST be with you the first 24 hours after you arrive home or your discharge will be delayed. 9. Please wear clothes that are easy to get on and off and wear slip-on shoes.  FOLLOW UP: YOU WILL NEED A FOLLOW UP APPOINTMENT WITH DR. HOCHREIN APPROXIMATELY 2-3 WEEKS AFTER YOUR PROCEDURE. YOU WILL BE CONTACTED BY SCHEDULING TO SET UP THIS APPOINTMENT.  Thank you for allowing [place] to care for you!   -- [place] [facility]    Pt wife verbalized understanding and will relay info to pt. Provided contact number for any questions or concerns

## 2019-10-14 ENCOUNTER — Encounter (HOSPITAL_COMMUNITY)
Admission: RE | Disposition: A | Discharge status: Home / Self Care | Payer: Managed Care, Other (non HMO) | Source: Home / Self Care | Attending: Cardiology

## 2019-10-14 ENCOUNTER — Other Ambulatory Visit: Payer: Self-pay

## 2019-10-14 ENCOUNTER — Ambulatory Visit (HOSPITAL_COMMUNITY)
Admission: RE | Admit: 2019-10-14 | Discharge: 2019-10-14 | Disposition: A | Discharge status: Home / Self Care | Payer: Managed Care, Other (non HMO) | Attending: Cardiology | Admitting: Cardiology

## 2019-10-14 DIAGNOSIS — E109 Type 1 diabetes mellitus without complications: Secondary | ICD-10-CM | POA: Diagnosis present

## 2019-10-14 DIAGNOSIS — Z794 Long term (current) use of insulin: Secondary | ICD-10-CM | POA: Insufficient documentation

## 2019-10-14 DIAGNOSIS — E039 Hypothyroidism, unspecified: Secondary | ICD-10-CM | POA: Insufficient documentation

## 2019-10-14 DIAGNOSIS — E119 Type 2 diabetes mellitus without complications: Secondary | ICD-10-CM | POA: Insufficient documentation

## 2019-10-14 DIAGNOSIS — Z7982 Long term (current) use of aspirin: Secondary | ICD-10-CM | POA: Insufficient documentation

## 2019-10-14 DIAGNOSIS — I2510 Atherosclerotic heart disease of native coronary artery without angina pectoris: Secondary | ICD-10-CM | POA: Diagnosis not present

## 2019-10-14 DIAGNOSIS — Z955 Presence of coronary angioplasty implant and graft: Secondary | ICD-10-CM | POA: Diagnosis not present

## 2019-10-14 DIAGNOSIS — Z79899 Other long term (current) drug therapy: Secondary | ICD-10-CM | POA: Diagnosis not present

## 2019-10-14 DIAGNOSIS — I252 Old myocardial infarction: Secondary | ICD-10-CM | POA: Diagnosis not present

## 2019-10-14 DIAGNOSIS — I10 Essential (primary) hypertension: Secondary | ICD-10-CM | POA: Diagnosis not present

## 2019-10-14 DIAGNOSIS — E785 Hyperlipidemia, unspecified: Secondary | ICD-10-CM | POA: Diagnosis not present

## 2019-10-14 DIAGNOSIS — R9439 Abnormal result of other cardiovascular function study: Secondary | ICD-10-CM | POA: Diagnosis present

## 2019-10-14 DIAGNOSIS — R9430 Abnormal result of cardiovascular function study, unspecified: Secondary | ICD-10-CM

## 2019-10-14 HISTORY — PX: LEFT HEART CATH AND CORONARY ANGIOGRAPHY: CATH118249

## 2019-10-14 LAB — BASIC METABOLIC PANEL
BUN/Creatinine Ratio: 17 (ref 9–20)
BUN: 13 mg/dL (ref 6–24)
CO2: 23 mmol/L (ref 20–29)
Calcium: 9.4 mg/dL (ref 8.7–10.2)
Chloride: 103 mmol/L (ref 96–106)
Creatinine, Ser: 0.78 mg/dL (ref 0.76–1.27)
GFR calc Af Amer: 118 mL/min/{1.73_m2} (ref >59)
GFR calc non Af Amer: 102 mL/min/{1.73_m2} (ref >59)
Glucose: 256 mg/dL — ABNORMAL HIGH (ref 65–99)
Potassium: 4.6 mmol/L (ref 3.5–5.2)
Sodium: 137 mmol/L (ref 134–144)

## 2019-10-14 LAB — CBC
Hematocrit: 45.7 % (ref 37.5–51.0)
Hemoglobin: 15.4 g/dL (ref 13.0–17.7)
MCH: 31.7 pg (ref 26.6–33.0)
MCHC: 33.7 g/dL (ref 31.5–35.7)
MCV: 94 fL (ref 79–97)
Platelets: 344 10*3/uL (ref 150–450)
RBC: 4.86 x10E6/uL (ref 4.14–5.80)
RDW: 13.7 % (ref 11.6–15.4)
WBC: 9 10*3/uL (ref 3.4–10.8)

## 2019-10-14 LAB — TSH: TSH: 2.21 u[IU]/mL (ref 0.450–4.500)

## 2019-10-14 LAB — GLUCOSE, CAPILLARY
Glucose-Capillary: 346 mg/dL — ABNORMAL HIGH (ref 70–99)
Glucose-Capillary: 343 mg/dL — ABNORMAL HIGH (ref 70–99)

## 2019-10-14 SURGERY — LEFT HEART CATH AND CORONARY ANGIOGRAPHY
Anesthesia: LOCAL

## 2019-10-14 MED ORDER — HEPARIN (PORCINE) IN NACL 1000-0.9 UT/500ML-% IV SOLN
INTRAVENOUS | Status: DC | PRN
  Administered 2019-10-14 (×2): 500 mL

## 2019-10-14 MED ORDER — IOHEXOL 350 MG/ML SOLN
INTRAVENOUS | Status: DC | PRN
  Administered 2019-10-14: 80 mL

## 2019-10-14 MED ORDER — SODIUM CHLORIDE 0.9 % WEIGHT BASED INFUSION: 1.0000 mL/kg/h | INTRAVENOUS | Status: AC

## 2019-10-14 MED ORDER — SODIUM CHLORIDE 0.9 % IV SOLN: 250.0000 mL | INTRAVENOUS | Status: DC | PRN

## 2019-10-14 MED ORDER — SODIUM CHLORIDE 0.9% FLUSH: 3.0000 mL | Freq: Two times a day (BID) | INTRAVENOUS | Status: DC

## 2019-10-14 MED ORDER — MIDAZOLAM HCL 2 MG/2ML IJ SOLN
INTRAMUSCULAR | Status: DC | PRN
  Administered 2019-10-14: 1 mg via INTRAVENOUS

## 2019-10-14 MED ORDER — HEPARIN SODIUM (PORCINE) 1000 UNIT/ML IJ SOLN
INTRAMUSCULAR | Status: DC | PRN
  Administered 2019-10-14: 5000 [IU] via INTRAVENOUS

## 2019-10-14 MED ORDER — ONDANSETRON HCL 4 MG/2ML IJ SOLN: 4.0000 mg | Freq: Four times a day (QID) | INTRAMUSCULAR | Status: DC | PRN

## 2019-10-14 MED ORDER — INSULIN ASPART 100 UNIT/ML ~~LOC~~ SOLN
0.0000 [IU] | Freq: Three times a day (TID) | SUBCUTANEOUS | Status: DC
  Administered 2019-10-14: 15 [IU] via SUBCUTANEOUS

## 2019-10-14 MED ORDER — SODIUM CHLORIDE 0.9 % WEIGHT BASED INFUSION: 1.0000 mL/kg/h | INTRAVENOUS | Status: DC

## 2019-10-14 MED ORDER — FENTANYL CITRATE (PF) 100 MCG/2ML IJ SOLN
INTRAMUSCULAR | Status: AC
  Filled 2019-10-14: qty 2

## 2019-10-14 MED ORDER — FENTANYL CITRATE (PF) 100 MCG/2ML IJ SOLN
INTRAMUSCULAR | Status: DC | PRN
  Administered 2019-10-14: 25 ug via INTRAVENOUS

## 2019-10-14 MED ORDER — ACETAMINOPHEN 325 MG PO TABS: 650.0000 mg | ORAL_TABLET | ORAL | Status: DC | PRN

## 2019-10-14 MED ORDER — LIDOCAINE HCL (PF) 1 % IJ SOLN
INTRAMUSCULAR | Status: AC
  Filled 2019-10-14: qty 30

## 2019-10-14 MED ORDER — SODIUM CHLORIDE 0.9% FLUSH: 3.0000 mL | INTRAVENOUS | Status: DC | PRN

## 2019-10-14 MED ORDER — HYDRALAZINE HCL 20 MG/ML IJ SOLN: 10.0000 mg | INTRAMUSCULAR | Status: DC | PRN

## 2019-10-14 MED ORDER — HEPARIN SODIUM (PORCINE) 1000 UNIT/ML IJ SOLN
INTRAMUSCULAR | Status: AC
  Filled 2019-10-14: qty 1

## 2019-10-14 MED ORDER — LABETALOL HCL 5 MG/ML IV SOLN: 10.0000 mg | INTRAVENOUS | Status: DC | PRN

## 2019-10-14 MED ORDER — LIDOCAINE HCL (PF) 1 % IJ SOLN
INTRAMUSCULAR | Status: DC | PRN
  Administered 2019-10-14 (×2): 2 mL via INTRADERMAL

## 2019-10-14 MED ORDER — SODIUM CHLORIDE 0.9 % WEIGHT BASED INFUSION
3.0000 mL/kg/h | INTRAVENOUS | Status: AC
  Administered 2019-10-14: 3 mL/kg/h via INTRAVENOUS

## 2019-10-14 MED ORDER — ASPIRIN 81 MG PO CHEW: 81.0000 mg | CHEWABLE_TABLET | ORAL | Status: DC

## 2019-10-14 MED ORDER — MIDAZOLAM HCL 2 MG/2ML IJ SOLN
INTRAMUSCULAR | Status: AC
  Filled 2019-10-14: qty 2

## 2019-10-14 MED ORDER — VERAPAMIL HCL 2.5 MG/ML IV SOLN
INTRAVENOUS | Status: DC | PRN
  Administered 2019-10-14: 08:00:00 10 mL via INTRA_ARTERIAL

## 2019-10-14 MED ORDER — HEPARIN (PORCINE) IN NACL 1000-0.9 UT/500ML-% IV SOLN
INTRAVENOUS | Status: AC
  Filled 2019-10-14: qty 1000

## 2019-10-14 MED ORDER — INSULIN ASPART 100 UNIT/ML ~~LOC~~ SOLN
SUBCUTANEOUS | Status: AC
  Filled 2019-10-14: qty 1

## 2019-10-14 MED ORDER — VERAPAMIL HCL 2.5 MG/ML IV SOLN
INTRAVENOUS | Status: AC
  Filled 2019-10-14: qty 2

## 2019-10-14 SURGICAL SUPPLY — 12 items
CATH IMPULSE 5F ANG/FL3.5 (CATHETERS) ×1 IMPLANT
DEVICE RAD COMP TR BAND LRG (VASCULAR PRODUCTS) ×1 IMPLANT
GLIDESHEATH SLEND SS 6F .021 (SHEATH) ×1 IMPLANT
GUIDEWIRE INQWIRE 1.5J.035X260 (WIRE) IMPLANT
INQWIRE 1.5J .035X260CM (WIRE) ×2
KIT HEART LEFT (KITS) ×2 IMPLANT
PACK CARDIAC CATHETERIZATION (CUSTOM PROCEDURE TRAY) ×2 IMPLANT
SHEATH PROBE COVER 6X72 (BAG) ×1 IMPLANT
SYR MEDRAD MARK 7 150ML (SYRINGE) ×2 IMPLANT
TRANSDUCER W/STOPCOCK (MISCELLANEOUS) ×2 IMPLANT
TUBING CIL FLEX 10 FLL-RA (TUBING) ×2 IMPLANT
WIRE HI TORQ VERSACORE-J 145CM (WIRE) ×1 IMPLANT

## 2019-10-14 NOTE — Discharge Instructions (Signed)
Radial Site Care ° °This sheet gives you information about how to care for yourself after your procedure. Your health care provider may also give you more specific instructions. If you have problems or questions, contact your health care provider. °What can I expect after the procedure? °After the procedure, it is common to have: °· Bruising and tenderness at the catheter insertion area. °Follow these instructions at home: °Medicines °· Take over-the-counter and prescription medicines only as told by your health care provider. °Insertion site care °· Follow instructions from your health care provider about how to take care of your insertion site. Make sure you: °? Wash your hands with soap and water before you change your bandage (dressing). If soap and water are not available, use hand sanitizer. °? Change your dressing as told by your health care provider. °? Leave stitches (sutures), skin glue, or adhesive strips in place. These skin closures may need to stay in place for 2 weeks or longer. If adhesive strip edges start to loosen and curl up, you may trim the loose edges. Do not remove adhesive strips completely unless your health care provider tells you to do that. °· Check your insertion site every day for signs of infection. Check for: °? Redness, swelling, or pain. °? Fluid or blood. °? Pus or a bad smell. °? Warmth. °· Do not take baths, swim, or use a hot tub until your health care provider approves. °· You may shower 24-48 hours after the procedure, or as directed by your health care provider. °? Remove the dressing and gently wash the site with plain soap and water. °? Pat the area dry with a clean towel. °? Do not rub the site. That could cause bleeding. °· Do not apply powder or lotion to the site. °Activity ° °· For 24 hours after the procedure, or as directed by your health care provider: °? Do not flex or bend the affected arm. °? Do not push or pull heavy objects with the affected arm. °? Do not  drive yourself home from the hospital or clinic. You may drive 24 hours after the procedure unless your health care provider tells you not to. °? Do not operate machinery or power tools. °· Do not lift anything that is heavier than 10 lb (4.5 kg), or the limit that you are told, until your health care provider says that it is safe. °· Ask your health care provider when it is okay to: °? Return to work or school. °? Resume usual physical activities or sports. °? Resume sexual activity. °General instructions °· If the catheter site starts to bleed, raise your arm and put firm pressure on the site. If the bleeding does not stop, get help right away. This is a medical emergency. °· If you went home on the same day as your procedure, a responsible adult should be with you for the first 24 hours after you arrive home. °· Keep all follow-up visits as told by your health care provider. This is important. °Contact a health care provider if: °· You have a fever. °· You have redness, swelling, or yellow drainage around your insertion site. °Get help right away if: °· You have unusual pain at the radial site. °· The catheter insertion area swells very fast. °· The insertion area is bleeding, and the bleeding does not stop when you hold steady pressure on the area. °· Your arm or hand becomes pale, cool, tingly, or numb. °These symptoms may represent a serious problem   that is an emergency. Do not wait to see if the symptoms will go away. Get medical help right away. Call your local emergency services (911 in the [place] Do not drive yourself to the hospital. °Summary °· After the procedure, it is common to have bruising and tenderness at the site. °· Follow instructions from your health care provider about how to take care of your radial site wound. Check the wound every day for signs of infection. °· Do not lift anything that is heavier than 10 lb (4.5 kg), or the limit that you are told, until your health care provider says  that it is safe. °This information is not intended to replace advice given to you by your health care provider. Make sure you discuss any questions you have with your health care provider. °Document Released: 11/11/2010 Document Revised: 11/14/2017 Document Reviewed: 11/14/2017 °Elsevier Patient Education © 2020 [employer] ° °

## 2019-10-14 NOTE — Interval H&P Note (Signed)
History and Physical Interval Note:  10/14/2019 7:32 AM  [name]  has presented today for surgery, with the diagnosis of HF.  The various methods of treatment have been discussed with the patient and family. After consideration of risks, benefits and other options for treatment, the patient has consented to  Procedure(s): LEFT HEART CATH AND CORONARY ANGIOGRAPHY (N/A) as a surgical intervention.  The patient's history has been reviewed, patient examined, no change in status, stable for surgery.  I have reviewed the patient's chart and labs.  Questions were answered to the patient's satisfaction.   Cath Lab Visit (complete for each Cath Lab visit)  Clinical Evaluation Leading to the Procedure:   ACS: No.  Non-ACS:    Anginal Classification: CCS II  Anti-ischemic medical therapy: No Therapy  Non-Invasive Test Results: High-risk stress test findings: cardiac mortality >3%/year  Prior CABG: No previous CABG        Collier [facility] 10/14/2019 7:32 AM

## 2019-10-31 ENCOUNTER — Ambulatory Visit: Payer: Managed Care, Other (non HMO) | Admitting: Cardiology

## 2019-11-07 ENCOUNTER — Ambulatory Visit: Payer: Managed Care, Other (non HMO) | Admitting: Cardiology

## 2019-11-13 NOTE — Progress Notes (Signed)
Cardiology Office Note   Date:  11/14/2019   ID:  [name], DOB [birth year], MRN [id number]  PCP:  Maris Berger, MD  Cardiologist:   Minus Breeding, MD   Chief Complaint  Patient presents with  . Coronary Artery Disease      History of Present Illness: [name] is a 54 y.o. male who presents for followup of his coronary disease. He is status post multivessel PCI.  He did have a stress test in 2017. He had an equivocal stress test had a cath with results below. He returns for follow up.   I did increase his Crestor at the last visit as he was not quite at target.   Since his catheterization he is done well.  He has been exercising more.  He says his sugar is better controlled. The patient denies any new symptoms such as chest discomfort, neck or arm discomfort. There has been no new shortness of breath, PND or orthopnea. There have been no reported palpitations, presyncope or syncope.    Past Medical History:  Diagnosis Date  . Coronary artery disease    s/p NSTEMI after ETT-echo - [facility] 7/13:  LM < 10%, pLAD 90%, mLAD 95%, pCFX tandem 90%, mCFX 80-90%, pRCA 30%, mRCA 40-50%;  PCI: Xience DES x 2 to prox and mid LAD; Xience DES x 1 to prox and mid CFX  . Dyspnea   . Family history of anesthesia complication    nausea   . HLD (hyperlipidemia)   . Hypertension   . Hypothyroidism    not on any meds for at least 8 months.  . Lung nodule 2013   CT 7/13: 3 mm RML nodule - felt to be stable and benign/ follow up in 2014  . Myocardial infarction [facility]) 2013   have 4 stents  . Type I diabetes mellitus (HCC)    insulin dependent    Past Surgical History:  Procedure Laterality Date  . [place] VITRECTOMY WITH 20 GAUGE MVR PORT FOR MACULAR HOLE Right 08/01/2016   25 GAUGE [place] VITRECTOMY WITH 20 GAUGE MVR PORT FOR MACULAR HOLE, MEMBRANE PEEL AND SERUM PATCH WITH ENDOLASER AND C3F8   . 25 GAUGE PARS PLANA VITRECTOMY WITH 20 GAUGE MVR PORT FOR MACULAR  HOLE Right 08/01/2016   Procedure: 25 GAUGE PARS PLANA VITRECTOMY WITH 20 GAUGE MVR PORT FOR MACULAR HOLE, MEMBRANE PEEL AND SERUM PATCH WITH ENDOLASER AND C3F8;  Surgeon: Hayden Pedro, MD;  Location: [facility];  Service: Ophthalmology;  Laterality: Right;  . APPENDECTOMY  1980's  . CATARACT EXTRACTION  07/2017   right eye  . CHOLECYSTECTOMY  ~ 2006  . CORONARY ANGIOPLASTY WITH STENT PLACEMENT  04/2013   LAD  . GAS/FLUID EXCHANGE Right 09/16/2013   Procedure: AIR/FLUID EXCHANGE;  Surgeon: Hayden Pedro, MD;  Location: [place];  Service: Ophthalmology;  Laterality: Right;  . GAS/FLUID EXCHANGE Right 08/01/2016   Procedure: GAS/FLUID EXCHANGE;  Surgeon: Hayden Pedro, MD;  Location: [place];  Service: Ophthalmology;  Laterality: Right;  . KNEE ARTHROSCOPY Right ~ 1984  . LEFT HEART CATH AND CORONARY ANGIOGRAPHY N/A 10/14/2019   Procedure: LEFT HEART CATH AND CORONARY ANGIOGRAPHY;  Surgeon: Martinique, Peter M, MD;  Location: [place] CV [facility];  Service: Cardiovascular;  Laterality: N/A;  . LEFT HEART CATHETERIZATION WITH CORONARY ANGIOGRAM N/A 05/03/2012   Procedure: LEFT HEART CATHETERIZATION WITH CORONARY ANGIOGRAM;  Surgeon: Peter M Martinique, MD;  Location: [facility] CATH LAB;  Service: Cardiovascular;  Laterality: N/A;  . MEMBRANE PEEL Right 09/16/2013   Procedure: MEMBRANE PEEL;  Surgeon: Hayden Pedro, MD;  Location: [place];  Service: Ophthalmology;  Laterality: Right;  . [place] VITRECTOMY Right 09/16/2013   w/membrane peel/notes 11/25/20145  . [place] VITRECTOMY Right 09/16/2013   Procedure: PARS PLANA VITRECTOMY WITH 25 GAUGE;  Surgeon: Hayden Pedro, MD;  Location: [place];  Service: Ophthalmology;  Laterality: Right;  . PHOTOCOAGULATION WITH LASER Right 09/16/2013   Procedure: PHOTOCOAGULATION WITH LASER;  Surgeon: Hayden Pedro, MD;  Location: [place];  Service: Ophthalmology;  Laterality: Right;  ENDOLASER  . SERUM PATCH Right 08/01/2016   Procedure: SERUM PATCH;  Surgeon: Hayden Pedro, MD;  Location: [place];  Service: Ophthalmology;  Laterality: Right;  . TONSILLECTOMY AND ADENOIDECTOMY  1970's     Current Outpatient Medications  Medication Sig Dispense Refill  . ACCU-CHEK AVIVA PLUS test strip 1 each by Other route as needed.     Marland Kitchen ADVAIR DISKUS 250-50 MCG/DOSE AEPB Inhale 1 puff into the lungs daily.     Marland Kitchen albuterol (PROVENTIL HFA;VENTOLIN HFA) 108 (90 Base) MCG/ACT inhaler Inhale 1 puff into the lungs every 6 (six) hours as needed for wheezing or shortness of breath.    Marland Kitchen aspirin 81 MG tablet Take 81 mg by mouth daily.    Marland Kitchen azelastine (ASTELIN) 0.1 % nasal spray Place 1 spray into the nose daily.     . Coenzyme Q10 200 MG capsule Take 200 mg by mouth daily.    . Continuous Blood Gluc Sensor (DEXCOM G6 SENSOR) MISC     . insulin degludec (TRESIBA FLEXTOUCH) 100 UNIT/ML SOPN FlexTouch Pen Inject 38 Units into the skin every morning.     . insulin lispro (HUMALOG) 100 UNIT/ML injection Inject 1-20 Units into the skin 3 (three) times daily before meals. 1 unit per 4 carbs    . losartan (COZAAR) 25 MG tablet TAKE 1 TABLET EVERY DAY (Patient taking differently: Take 25 mg by mouth daily. ) 30 tablet 11  . nitroGLYCERIN (NITROSTAT) 0.4 MG SL tablet PLACE 1 TABLET UNDER THE TONGUE EVERY 5 (FIVE) MINUTES AS NEEDED FOR CHEST PAIN AS DIRECTED (Patient taking differently: Place 0.4 mg under the tongue every 5 (five) minutes as needed for chest pain. ) 25 tablet 8  . rosuvastatin (CRESTOR) 40 MG tablet Take 1 tablet (40 mg total) by mouth daily at 6 PM. 90 tablet 3  . Semaglutide, 1 MG/DOSE, (OZEMPIC, 1 MG/DOSE,) 2 MG/1.5ML SOPN Inject 1 mg into the skin every Monday.      No current facility-administered medications for this visit.    Allergies:   Patient has no known allergies.    ROS:  Please see the history of present illness.   Otherwise, review of systems are positive for none.   All other systems are reviewed and negative.    PHYSICAL EXAM: VS:  BP 130/72 (BP  Location: Left Arm, Patient Position: Sitting, Cuff Size: Normal)   Pulse 82   Temp (!) 97 F (36.1 C)   Ht 5\' 11"  (1.803 m)   Wt 232 lb (105.2 kg)   BMI 32.36 kg/m  , BMI Body mass index is 32.36 kg/m. GENERAL:  Well appearing NECK:  No jugular venous distention, waveform within normal limits, carotid upstroke brisk and symmetric, no bruits, no thyromegaly LUNGS:  Clear to auscultation bilaterally CHEST:  Unremarkable HEART:  PMI not displaced or sustained,S1 and S2 within normal limits, no  S3, no S4, no clicks, no rubs, no murmurs ABD:  Flat, positive bowel sounds normal in frequency in pitch, no bruits, no rebound, no guarding, no midline pulsatile mass, no hepatomegaly, no splenomegaly EXT:  2 plus pulses throughout, no edema, no cyanosis no clubbing, right ulnar access site without bruising  Cardiac cath  10/14/19 Diagnostic Dominance: Co-dominant    EKG:  EKG is ordered today. Sinus rhythm, rate 82, axis within normal limits, intervals within normal limits, nonspecific inferolateral ST changes.   Recent Labs: 10/13/2019: BUN 13; Creatinine, Ser 0.78; Hemoglobin 15.4; Platelets 344; Potassium 4.6; Sodium 137; TSH 2.210     Wt Readings from Last 3 Encounters:  11/14/19 232 lb (105.2 kg)  10/14/19 228 lb (103.4 kg)  09/25/19 233 lb 9.6 oz (106 kg)      Other studies Reviewed: Additional studies/ records that were reviewed today include: Cath. Review of the above records demonstrates:  See elsewhere     ASSESSMENT AND PLAN:  CAD  He has results as above.  We are continuing with aggressive secondary risk reduction.    Hypertension  The blood pressure is at target.  No change in therapy.  Hyperlipidemia  LDL is slightly elevated but I increased his Crestor.  I will repeat a lipid profile in March.   DM  A1C was he says this is better controlled.  We have talked about diet and exercise and he is participating more in both of these.  Current medicines are  reviewed at length with the patient today.  The patient does not have concerns regarding medicines.  The following changes have been made:  None  Labs/ tests ordered today include:   Orders Placed This Encounter  Procedures  . Lipid panel  . Hepatic function panel  . EKG 12-Lead     Disposition:   FU with me in 12 months.    Signed, Minus Breeding, MD  11/14/2019 10:25 AM    [facility]

## 2019-11-14 ENCOUNTER — Other Ambulatory Visit: Payer: Self-pay

## 2019-11-14 ENCOUNTER — Encounter: Payer: Self-pay | Admitting: Cardiology

## 2019-11-14 ENCOUNTER — Ambulatory Visit (INDEPENDENT_AMBULATORY_CARE_PROVIDER_SITE_OTHER): Payer: Managed Care, Other (non HMO) | Admitting: Cardiology

## 2019-11-14 VITALS — BP 130/72 | HR 82 | Temp 97.0°F | Ht 71.0 in | Wt 232.0 lb

## 2019-11-14 DIAGNOSIS — E118 Type 2 diabetes mellitus with unspecified complications: Secondary | ICD-10-CM | POA: Diagnosis not present

## 2019-11-14 DIAGNOSIS — E785 Hyperlipidemia, unspecified: Secondary | ICD-10-CM | POA: Diagnosis not present

## 2019-11-14 DIAGNOSIS — I2510 Atherosclerotic heart disease of native coronary artery without angina pectoris: Secondary | ICD-10-CM | POA: Diagnosis not present

## 2019-11-14 NOTE — Patient Instructions (Signed)
Medication Instructions:  No Changes *If you need a refill on your cardiac medications before your next appointment, please call your pharmacy*  Lab Work: Your physician recommends that you return for lab work in March (Lipids / Liver) If you have labs (blood work) drawn today and your tests are completely normal, you will receive your results only by: Marland Kitchen MyChart Message (if you have MyChart) OR . A paper copy in the mail If you have any lab test that is abnormal or we need to change your treatment, we will call you to review the results.  Testing/Procedures: None  Follow-Up: At [facility], you and your health needs are our priority.  As part of our continuing mission to provide you with exceptional heart care, we have created designated Provider Care Teams.  These Care Teams include your primary Cardiologist (physician) and Advanced Practice Providers (APPs -  Physician Assistants and Nurse Practitioners) who all work together to provide you with the care you need, when you need it.  Your next appointment:   1 year(s)  The format for your next appointment:   In Person  Provider:   Minus Breeding, MD

## 2019-12-18 ENCOUNTER — Encounter (INDEPENDENT_AMBULATORY_CARE_PROVIDER_SITE_OTHER): Payer: PRIVATE HEALTH INSURANCE | Admitting: Ophthalmology

## 2020-01-27 ENCOUNTER — Encounter (INDEPENDENT_AMBULATORY_CARE_PROVIDER_SITE_OTHER): Payer: PRIVATE HEALTH INSURANCE | Admitting: Ophthalmology

## 2020-02-24 ENCOUNTER — Other Ambulatory Visit: Payer: Self-pay | Admitting: Cardiology

## 2020-03-08 ENCOUNTER — Other Ambulatory Visit: Payer: Self-pay

## 2020-03-08 ENCOUNTER — Encounter (INDEPENDENT_AMBULATORY_CARE_PROVIDER_SITE_OTHER): Payer: PRIVATE HEALTH INSURANCE | Admitting: Ophthalmology

## 2020-03-08 DIAGNOSIS — E10319 Type 1 diabetes mellitus with unspecified diabetic retinopathy without macular edema: Secondary | ICD-10-CM | POA: Diagnosis not present

## 2020-03-08 DIAGNOSIS — I10 Essential (primary) hypertension: Secondary | ICD-10-CM

## 2020-03-08 DIAGNOSIS — E103593 Type 1 diabetes mellitus with proliferative diabetic retinopathy without macular edema, bilateral: Secondary | ICD-10-CM

## 2020-03-08 DIAGNOSIS — H35033 Hypertensive retinopathy, bilateral: Secondary | ICD-10-CM | POA: Diagnosis not present

## 2020-03-08 DIAGNOSIS — H43813 Vitreous degeneration, bilateral: Secondary | ICD-10-CM

## 2020-09-08 ENCOUNTER — Encounter (INDEPENDENT_AMBULATORY_CARE_PROVIDER_SITE_OTHER): Payer: Managed Care, Other (non HMO) | Admitting: Ophthalmology

## 2020-09-29 ENCOUNTER — Telehealth: Payer: Self-pay | Admitting: Cardiology

## 2020-09-29 MED ORDER — LOSARTAN POTASSIUM 25 MG PO TABS: 25.0000 mg | ORAL_TABLET | Freq: Every day | ORAL | 1 refills | Status: DC

## 2020-09-29 NOTE — Telephone Encounter (Signed)
Left  Detail message to patient . medication has  Been e- sent to pharmacy  With one refill .  will need to ask for a years refill at upcoming appointment with Dr [name]. Any question may call back .

## 2020-09-29 NOTE — Telephone Encounter (Signed)
*  STAT* If patient is at the pharmacy, call can be transferred to refill team.   1. Which medications need to be refilled? (please list name of each medication and dose if known)  losartan (COZAAR) 25 MG tablet  2. Which pharmacy/location (including street and city if local pharmacy) is medication to be sent to? [facility] [place], [place]  3. Do they need a 30 day or 90 day supply? [place]

## 2020-09-30 ENCOUNTER — Encounter (INDEPENDENT_AMBULATORY_CARE_PROVIDER_SITE_OTHER): Payer: Managed Care, Other (non HMO) | Admitting: Ophthalmology

## 2020-09-30 ENCOUNTER — Other Ambulatory Visit: Payer: Self-pay

## 2020-09-30 DIAGNOSIS — E103593 Type 1 diabetes mellitus with proliferative diabetic retinopathy without macular edema, bilateral: Secondary | ICD-10-CM

## 2020-09-30 DIAGNOSIS — H2512 Age-related nuclear cataract, left eye: Secondary | ICD-10-CM

## 2020-09-30 DIAGNOSIS — I10 Essential (primary) hypertension: Secondary | ICD-10-CM

## 2020-09-30 DIAGNOSIS — E10319 Type 1 diabetes mellitus with unspecified diabetic retinopathy without macular edema: Secondary | ICD-10-CM | POA: Diagnosis not present

## 2020-09-30 DIAGNOSIS — H35033 Hypertensive retinopathy, bilateral: Secondary | ICD-10-CM | POA: Diagnosis not present

## 2020-09-30 DIAGNOSIS — H43812 Vitreous degeneration, left eye: Secondary | ICD-10-CM

## 2020-11-13 NOTE — Progress Notes (Signed)
Cardiology Office Note   Date:  11/15/2020   ID:  [name], DOB 01-Jul-1966, MRN [id number]  PCP:  Maris Berger, MD  Cardiologist:   Minus Breeding, MD   Chief Complaint  Patient presents with  . Leg Swelling      History of Present Illness: [name] is a 55 y.o. male who presents for followup of his coronary disease. He is status post multivessel PCI.  He did have a stress test in 2017. He had an equivocal stress test had a cath with results below from 2020.   Since I last saw him he has done okay from a cardiovascular standpoint.  He and his wife moved to a smaller townhouse and is not exercising as much as he did when they were living in their house.  He has a new job.  He is more sedentary and is his feet down.  At the end of the day he has more swelling in his legs slightly right greater than left.  He is not having any new cardiovascular symptoms however. The patient denies any new symptoms such as chest discomfort, neck or arm discomfort. There has been no new shortness of breath, PND or orthopnea. There have been no reported palpitations, presyncope or syncope.    Past Medical History:  Diagnosis Date  . Coronary artery disease    s/p NSTEMI after ETT-echo - LHC 7/13:  LM < 10%, pLAD 90%, mLAD 95%, pCFX tandem 90%, mCFX 80-90%, pRCA 30%, mRCA 40-50%;  PCI: Xience DES x 2 to prox and mid LAD; Xience DES x 1 to prox and mid CFX  . Family history of anesthesia complication    nausea   . HLD (hyperlipidemia)   . Hypertension   . Hypothyroidism    not on any meds for at least 8 months.  . Lung nodule 2013   CT 7/13: 3 mm RML nodule - felt to be stable and benign/ follow up in 2014  . Myocardial infarction [facility]) 2013   have 4 stents  . Type I diabetes mellitus (HCC)    insulin dependent    Past Surgical History:  Procedure Laterality Date  . [place] VITRECTOMY WITH 20 GAUGE MVR PORT FOR MACULAR HOLE Right 08/01/2016   25 GAUGE PARS PLANA  VITRECTOMY WITH 20 GAUGE MVR PORT FOR MACULAR HOLE, MEMBRANE PEEL AND SERUM PATCH WITH ENDOLASER AND C3F8   . 25 GAUGE PARS PLANA VITRECTOMY WITH 20 GAUGE MVR PORT FOR MACULAR HOLE Right 08/01/2016   Procedure: 25 GAUGE PARS PLANA VITRECTOMY WITH 20 GAUGE MVR PORT FOR MACULAR HOLE, MEMBRANE PEEL AND SERUM PATCH WITH ENDOLASER AND C3F8;  Surgeon: Hayden Pedro, MD;  Location: [place];  Service: Ophthalmology;  Laterality: Right;  . APPENDECTOMY  1980's  . CATARACT EXTRACTION  07/2017   right eye  . CHOLECYSTECTOMY  ~ 2006  . CORONARY ANGIOPLASTY WITH STENT PLACEMENT  04/2013   LAD  . GAS/FLUID EXCHANGE Right 09/16/2013   Procedure: AIR/FLUID EXCHANGE;  Surgeon: Hayden Pedro, MD;  Location: [place];  Service: Ophthalmology;  Laterality: Right;  . GAS/FLUID EXCHANGE Right 08/01/2016   Procedure: GAS/FLUID EXCHANGE;  Surgeon: Hayden Pedro, MD;  Location: [place];  Service: Ophthalmology;  Laterality: Right;  . KNEE ARTHROSCOPY Right ~ 1984  . LEFT HEART CATH AND CORONARY ANGIOGRAPHY N/A 10/14/2019   Procedure: LEFT HEART CATH AND CORONARY ANGIOGRAPHY;  Surgeon: Martinique, Peter M, MD;  Location: [facility] INVASIVE CV  LAB;  Service: Cardiovascular;  Laterality: N/A;  . LEFT HEART CATHETERIZATION WITH CORONARY ANGIOGRAM N/A 05/03/2012   Procedure: LEFT HEART CATHETERIZATION WITH CORONARY ANGIOGRAM;  Surgeon: Peter M Martinique, MD;  Location: [facility] CATH LAB;  Service: Cardiovascular;  Laterality: N/A;  . MEMBRANE PEEL Right 09/16/2013   Procedure: MEMBRANE PEEL;  Surgeon: Hayden Pedro, MD;  Location: [place];  Service: Ophthalmology;  Laterality: Right;  . PARS PLANA VITRECTOMY Right 09/16/2013   w/membrane peel/notes 11/25/20145  . PARS PLANA VITRECTOMY Right 09/16/2013   Procedure: PARS PLANA VITRECTOMY WITH 25 GAUGE;  Surgeon: Hayden Pedro, MD;  Location: [place];  Service: Ophthalmology;  Laterality: Right;  . PHOTOCOAGULATION WITH LASER Right 09/16/2013   Procedure: PHOTOCOAGULATION WITH LASER;  Surgeon:  Hayden Pedro, MD;  Location: [place];  Service: Ophthalmology;  Laterality: Right;  ENDOLASER  . SERUM PATCH Right 08/01/2016   Procedure: SERUM PATCH;  Surgeon: Hayden Pedro, MD;  Location: [place];  Service: Ophthalmology;  Laterality: Right;  . TONSILLECTOMY AND ADENOIDECTOMY  1970's     Current Outpatient Medications  Medication Sig Dispense Refill  . ACCU-CHEK AVIVA PLUS test strip 1 each by Other route as needed.     Marland Kitchen ADVAIR DISKUS 250-50 MCG/DOSE AEPB Inhale 1 puff into the lungs daily.     Marland Kitchen albuterol (PROVENTIL HFA;VENTOLIN HFA) 108 (90 Base) MCG/ACT inhaler Inhale 1 puff into the lungs every 6 (six) hours as needed for wheezing or shortness of breath.    Marland Kitchen aspirin 81 MG tablet Take 81 mg by mouth daily.    Marland Kitchen azelastine (ASTELIN) 0.1 % nasal spray Place 1 spray into the nose daily.     . Coenzyme Q10 200 MG capsule Take 200 mg by mouth daily.    . Continuous Blood Gluc Sensor (DEXCOM G6 SENSOR) MISC     . insulin degludec (TRESIBA) 100 UNIT/ML FlexTouch Pen Inject 38 Units into the skin every morning.     . insulin lispro (HUMALOG) 100 UNIT/ML injection Inject 1-20 Units into the skin 3 (three) times daily before meals. 1 unit per 4 carbs    . Semaglutide, 1 MG/DOSE, (OZEMPIC, 1 MG/DOSE,) 2 MG/1.5ML SOPN Inject 1 mg into the skin every Monday.     . losartan (COZAAR) 25 MG tablet Take 1 tablet (25 mg total) by mouth daily. 90 tablet 3  . nitroGLYCERIN (NITROSTAT) 0.4 MG SL tablet PLACE 1 TABLET UNDER THE TONGUE EVERY 5 (FIVE) MINUTES AS NEEDED FOR CHEST PAIN AS DIRECTED 25 tablet 1  . rosuvastatin (CRESTOR) 40 MG tablet Take 1 tablet (40 mg total) by mouth daily at 6 PM. 90 tablet 3   No current facility-administered medications for this visit.    Allergies:   Patient has no known allergies.    ROS:  Please see the history of present illness.   Otherwise, review of systems are positive for none.   All other systems are reviewed and negative.    PHYSICAL EXAM: VS:  BP  140/86 (BP Location: Left Arm, Patient Position: Sitting)   Pulse 88   Ht 5\' 11"  (1.803 m)   Wt 235 lb 3.2 oz (106.7 kg)   SpO2 95%   BMI 32.80 kg/m  , BMI Body mass index is 32.8 kg/m.  GENERAL:  Well appearing NECK:  No jugular venous distention, waveform within normal limits, carotid upstroke brisk and symmetric, no bruits, no thyromegaly LUNGS:  Clear to auscultation bilaterally CHEST:  Unremarkable HEART:  PMI not displaced or  sustained,S1 and S2 within normal limits, no S3, no S4, no clicks, no rubs, no murmurs ABD:  Flat, positive bowel sounds normal in frequency in pitch, no bruits, no rebound, no guarding, no midline pulsatile mass, no hepatomegaly, no splenomegaly EXT:  2 plus pulses throughout, trace right greater than left leg edema, no cyanosis no clubbing   Cardiac cath  10/14/19 Diagnostic Dominance: Co-dominant    EKG:  EKG is ordered today. Sinus rhythm, rate 88, axis within normal limits, intervals within normal limits, nonspecific inferolateral ST changes. No change from previous.    Recent Labs: No results found for requested labs within last 8760 hours.     Wt Readings from Last 3 Encounters:  11/15/20 235 lb 3.2 oz (106.7 kg)  11/14/19 232 lb (105.2 kg)  10/14/19 228 lb (103.4 kg)      Other studies Reviewed: Additional studies/ records that were reviewed today include: Labs Review of the above records demonstrates:  See elsewhere     ASSESSMENT AND PLAN:  CAD  The patient has no new sypmtoms.  No further cardiovascular testing is indicated.  We will continue with aggressive risk reduction and meds as listed.he really has no new symptoms since his catheterization in December 2020.  He needs continued risk reduction.   Hypertension  The blood pressure is slightly elevated but it is when he comes to the office and it is well controlled at home.  I will believe this slight white coat hypertension and not change his meds otherwise.    Hyperlipidemia  LDL is calculated 148 in December.  Total cholesterol was 232, triglycerides 73.  This LDL is much higher than previous so I am going to check a direct LDL.  If it is elevated he will probably need a PCSK9 inhibitor.  Of note he has some vague symptoms that he thinks might be related to his Crestor but I convinced him to try to improve his sleep and his physical activity and if he still having these symptoms of mild muscle fatigue we might consider switching the Crestor.  DM  A1C 6.6.  No change in therapy.   Current medicines are reviewed at length with the patient today.  The patient does not have concerns regarding medicines.  The following changes have been made:  None  Labs/ tests ordered today include: None  Orders Placed This Encounter  Procedures  . LDL cholesterol, direct  . EKG 12-Lead     Disposition:   FU with me in 12 months.    Signed, Minus Breeding, MD  11/15/2020 10:20 AM    [facility]

## 2020-11-15 ENCOUNTER — Encounter: Payer: Self-pay | Admitting: Cardiology

## 2020-11-15 ENCOUNTER — Ambulatory Visit (INDEPENDENT_AMBULATORY_CARE_PROVIDER_SITE_OTHER): Payer: No Typology Code available for payment source | Admitting: Cardiology

## 2020-11-15 ENCOUNTER — Other Ambulatory Visit: Payer: Self-pay

## 2020-11-15 VITALS — BP 140/86 | HR 88 | Ht 71.0 in | Wt 235.2 lb

## 2020-11-15 DIAGNOSIS — E785 Hyperlipidemia, unspecified: Secondary | ICD-10-CM | POA: Diagnosis not present

## 2020-11-15 DIAGNOSIS — E118 Type 2 diabetes mellitus with unspecified complications: Secondary | ICD-10-CM | POA: Diagnosis not present

## 2020-11-15 DIAGNOSIS — I2510 Atherosclerotic heart disease of native coronary artery without angina pectoris: Secondary | ICD-10-CM | POA: Diagnosis not present

## 2020-11-15 DIAGNOSIS — I10 Essential (primary) hypertension: Secondary | ICD-10-CM | POA: Diagnosis not present

## 2020-11-15 LAB — LDL CHOLESTEROL, DIRECT: LDL Direct: 156 mg/dL — ABNORMAL HIGH (ref 0–99)

## 2020-11-15 MED ORDER — NITROGLYCERIN 0.4 MG SL SUBL: SUBLINGUAL_TABLET | SUBLINGUAL | 1 refills | Status: DC

## 2020-11-15 MED ORDER — LOSARTAN POTASSIUM 25 MG PO TABS: 25.0000 mg | ORAL_TABLET | Freq: Every day | ORAL | 3 refills | Status: DC

## 2020-11-15 MED ORDER — ROSUVASTATIN CALCIUM 40 MG PO TABS: 40.0000 mg | ORAL_TABLET | Freq: Every day | ORAL | 3 refills | Status: DC

## 2020-11-15 NOTE — Patient Instructions (Signed)
Medication Instructions:  No changes *If you need a refill on your cardiac medications before your next appointment, please call your pharmacy*   Lab Work: Your provider would like for you to have the following labs today: Direct LDL  If you have labs (blood work) drawn today and your tests are completely normal, you will receive your results only by: Marland Kitchen MyChart Message (if you have MyChart) OR . A paper copy in the mail If you have any lab test that is abnormal or we need to change your treatment, we will call you to review the results.   Testing/Procedures: None ordered   Follow-Up: At [facility], you and your health needs are our priority.  As part of our continuing mission to provide you with exceptional heart care, we have created designated Provider Care Teams.  These Care Teams include your primary Cardiologist (physician) and Advanced Practice Providers (APPs -  Physician Assistants and Nurse Practitioners) who all work together to provide you with the care you need, when you need it.  We recommend signing up for the patient portal called "MyChart".  Sign up information is provided on this After Visit Summary.  MyChart is used to connect with patients for Virtual Visits (Telemedicine).  Patients are able to view lab/test results, encounter notes, upcoming appointments, etc.  Non-urgent messages can be sent to your provider as well.   To learn more about what you can do with MyChart, go to [email or web address].    Your next appointment:   12 month(s)  The format for your next appointment:   In Person  Provider:   You may see Minus Breeding, MD or one of the following Advanced Practice Providers on your designated Care Team:    Rosaria Ferries, PA-C  Jory Sims, DNP, ANP

## 2020-11-25 ENCOUNTER — Telehealth: Payer: Self-pay | Admitting: Cardiology

## 2020-11-25 ENCOUNTER — Other Ambulatory Visit: Payer: Self-pay

## 2020-11-25 DIAGNOSIS — E785 Hyperlipidemia, unspecified: Secondary | ICD-10-CM

## 2020-11-25 NOTE — Telephone Encounter (Signed)
Patient is returning call to discuss lab results. 

## 2020-11-25 NOTE — Telephone Encounter (Signed)
See result note for lipids.

## 2020-12-09 ENCOUNTER — Encounter: Payer: Self-pay | Admitting: Gastroenterology

## 2020-12-27 ENCOUNTER — Encounter: Payer: Self-pay | Admitting: Gastroenterology

## 2021-01-12 ENCOUNTER — Telehealth: Payer: Self-pay | Admitting: *Deleted

## 2021-01-12 NOTE — Telephone Encounter (Signed)
   [facility] Pre-operative Risk Assessment    HEARTCARE STAFF: - Please ensure there is not already an duplicate clearance open for this procedure. - Under Visit Info/Reason for Call, type in Other and utilize the format Clearance MM/DD/YY or Clearance TBD. Do not use dashes or single digits. - If request is for dental extraction, please clarify the # of teeth to be extracted.  Request for surgical clearance: DENTAL OFFICE REQUEST RECENT OV NOTE AND LABS, WILL HAVE MED REC DEPT FAX NOTES TO DR. Buelah Manis  1. What type of surgery is being performed? 1 TOOTH EXTRACTION WITH DENTAL IMPLANT    2. When is this surgery scheduled? TBD   3. What type of clearance is required (medical clearance vs. Pharmacy clearance to hold med vs. Both)? MEDICAL  4. Are there any medications that need to be held prior to surgery and how long? ASA    5. Practice name and name of physician performing surgery? [facility]; DR. Halfway   6. What is the office phone number? [phone]   7.   What is the office fax number? [phone]  8.   Anesthesia type (None, local, MAC, general) ? PROPOFOL, VERSED, Mendel Corning 01/12/2021, 4:58 PM  _________________________________________________________________   (provider comments below)

## 2021-01-14 NOTE — Telephone Encounter (Signed)
   Primary Cardiologist: Minus Breeding, MD  Chart reviewed as part of pre-operative protocol coverage.   Simple dental extractions are considered low risk procedures per guidelines and generally do not require any specific cardiac clearance. It is also generally accepted that for simple extractions and dental cleanings, there is no need to interrupt blood thinner therapy.  If significant bleeding is expected with dental implant, may hold aspirin for 5 days prior to the procedure and restart as soon as possible after the procedure.  Last cardiac catheterization was performed in December 2020 which showed occluded second OM with left to left collaterals, 50% proximal to mid RCA disease unchanged when compared to the previous cath in 2013, 25% proximal LAD lesion, EF 55 to 65%.  Medical management was recommended.  SBE prophylaxis is not required for the patient from a cardiac standpoint.  Callback pool to contact medical record department to fax over recent office note and lab work to the requesting provider.    Please call with questions.  [place], [place] 01/14/2021, 4:46 PM

## 2021-01-14 NOTE — Telephone Encounter (Signed)
Will route back to the requesting surgeon's office to make them aware.  Will send to medical records to send records requested.

## 2021-01-18 NOTE — Telephone Encounter (Signed)
Last office note & lab results faxed to the [facility]

## 2021-02-16 ENCOUNTER — Other Ambulatory Visit: Payer: Self-pay

## 2021-02-16 ENCOUNTER — Ambulatory Visit (AMBULATORY_SURGERY_CENTER): Payer: Self-pay | Admitting: *Deleted

## 2021-02-16 VITALS — Ht 71.0 in | Wt 238.0 lb

## 2021-02-16 DIAGNOSIS — Z86010 Personal history of colonic polyps: Secondary | ICD-10-CM

## 2021-02-16 MED ORDER — SUTAB 1479-225-188 MG PO TABS: 1.0000 | ORAL_TABLET | Freq: Once | ORAL | 0 refills | Status: AC

## 2021-02-16 NOTE — Progress Notes (Signed)

## 2021-02-21 ENCOUNTER — Encounter: Payer: Self-pay | Admitting: Internal Medicine

## 2021-02-21 ENCOUNTER — Ambulatory Visit: Payer: No Typology Code available for payment source | Admitting: Internal Medicine

## 2021-02-21 ENCOUNTER — Telehealth: Payer: Self-pay | Admitting: Internal Medicine

## 2021-02-21 ENCOUNTER — Other Ambulatory Visit: Payer: Self-pay

## 2021-02-21 VITALS — BP 160/83 | HR 110 | Ht 71.0 in | Wt 243.6 lb

## 2021-02-21 DIAGNOSIS — M791 Myalgia, unspecified site: Secondary | ICD-10-CM | POA: Diagnosis not present

## 2021-02-21 DIAGNOSIS — E785 Hyperlipidemia, unspecified: Secondary | ICD-10-CM | POA: Diagnosis not present

## 2021-02-21 DIAGNOSIS — I2510 Atherosclerotic heart disease of native coronary artery without angina pectoris: Secondary | ICD-10-CM

## 2021-02-21 DIAGNOSIS — E108 Type 1 diabetes mellitus with unspecified complications: Secondary | ICD-10-CM

## 2021-02-21 DIAGNOSIS — T466X5A Adverse effect of antihyperlipidemic and antiarteriosclerotic drugs, initial encounter: Secondary | ICD-10-CM

## 2021-02-21 MED ORDER — PRALUENT 150 MG/ML ~~LOC~~ SOAJ: 1.0000 | SUBCUTANEOUS | 3 refills | Status: DC

## 2021-02-21 MED ORDER — REPATHA SURECLICK 140 MG/ML ~~LOC~~ SOAJ: 140.0000 mg | SUBCUTANEOUS | 3 refills | Status: DC

## 2021-02-21 MED ORDER — ROSUVASTATIN CALCIUM 5 MG PO TABS: 5.0000 mg | ORAL_TABLET | Freq: Every day | ORAL | 3 refills | Status: DC

## 2021-02-21 NOTE — Progress Notes (Signed)
LIPID CLINIC CONSULT NOTE  Chief Complaint:  Manage dyslipidemia  Primary Care Physician: Maris Berger, MD  Primary Cardiologist:  Minus Breeding, MD  HPI:  [name] is a 55 y.o. male who is being seen today for the evaluation of dyslipidemia at the request of Minus Breeding, MD. This is a 55 year old male seen today for evaluation management of dyslipidemia at the request of Dr. [name].  He has a history of coronary artery disease with NSTEMI and prior PCI.  He also has a type I diabetic since age 24 and other risk factors such as hypertension is noted.  Unfortunately, he has had difficulty tolerating statins.  He had side effects with atorvastatin which caused myalgias.  He also has recently had myalgias and symptoms related to rosuvastatin.  He reports that he has not been taking it regularly which would explain his elevated LDL cholesterol.  Direct LDL was 156 in January and he was referred to the lipid clinic for evaluation of possible PCSK9 inhibitor.  PMHx:  Past Medical History:  Diagnosis Date  . Coronary artery disease    s/p NSTEMI after ETT-echo - [facility] 7/13:  LM < 10%, pLAD 90%, mLAD 95%, pCFX tandem 90%, mCFX 80-90%, pRCA 30%, mRCA 40-50%;  PCI: Xience DES x 2 to prox and mid LAD; Xience DES x 1 to prox and mid CFX  . Family history of anesthesia complication    nausea   . GERD (gastroesophageal reflux disease)   . HLD (hyperlipidemia)   . Hypertension   . Lung nodule 2013   CT 7/13: 3 mm RML nodule - felt to be stable and benign/ follow up in 2014  . Myocardial infarction [facility]) 2013   have 4 stents  . S/P coronary artery stent placement   . Type I diabetes mellitus (HCC)    insulin dependent    Past Surgical History:  Procedure Laterality Date  . [place] VITRECTOMY WITH 20 GAUGE MVR PORT FOR MACULAR HOLE Right 08/01/2016   25 GAUGE PARS PLANA VITRECTOMY WITH 20 GAUGE MVR PORT FOR MACULAR HOLE, MEMBRANE PEEL AND SERUM PATCH WITH ENDOLASER  AND C3F8   . 25 GAUGE PARS PLANA VITRECTOMY WITH 20 GAUGE MVR PORT FOR MACULAR HOLE Right 08/01/2016   Procedure: 25 GAUGE PARS PLANA VITRECTOMY WITH 20 GAUGE MVR PORT FOR MACULAR HOLE, MEMBRANE PEEL AND SERUM PATCH WITH ENDOLASER AND C3F8;  Surgeon: Hayden Pedro, MD;  Location: [place];  Service: Ophthalmology;  Laterality: Right;  . APPENDECTOMY  1980's  . CATARACT EXTRACTION  07/2017   right eye  . CHOLECYSTECTOMY  ~ 2006  . COLONOSCOPY    . CORONARY ANGIOPLASTY WITH STENT PLACEMENT  04/2013   LAD  . GAS/FLUID EXCHANGE Right 09/16/2013   Procedure: AIR/FLUID EXCHANGE;  Surgeon: Hayden Pedro, MD;  Location: [place];  Service: Ophthalmology;  Laterality: Right;  . GAS/FLUID EXCHANGE Right 08/01/2016   Procedure: GAS/FLUID EXCHANGE;  Surgeon: Hayden Pedro, MD;  Location: [place];  Service: Ophthalmology;  Laterality: Right;  . KNEE ARTHROSCOPY Right ~ 1984  . LEFT HEART CATH AND CORONARY ANGIOGRAPHY N/A 10/14/2019   Procedure: LEFT HEART CATH AND CORONARY ANGIOGRAPHY;  Surgeon: Martinique, Peter M, MD;  Location: [place] CV LAB;  Service: Cardiovascular;  Laterality: N/A;  . LEFT HEART CATHETERIZATION WITH CORONARY ANGIOGRAM N/A 05/03/2012   Procedure: LEFT HEART CATHETERIZATION WITH CORONARY ANGIOGRAM;  Surgeon: Peter M Martinique, MD;  Location: [facility] CATH LAB;  Service: Cardiovascular;  Laterality: N/A;  . MEMBRANE PEEL Right 09/16/2013   Procedure: MEMBRANE PEEL;  Surgeon: Hayden Pedro, MD;  Location: [place];  Service: Ophthalmology;  Laterality: Right;  . PARS PLANA VITRECTOMY Right 09/16/2013   w/membrane peel/notes 11/25/20145  . PARS PLANA VITRECTOMY Right 09/16/2013   Procedure: PARS PLANA VITRECTOMY WITH 25 GAUGE;  Surgeon: Hayden Pedro, MD;  Location: [place];  Service: Ophthalmology;  Laterality: Right;  . PHOTOCOAGULATION WITH LASER Right 09/16/2013   Procedure: PHOTOCOAGULATION WITH LASER;  Surgeon: Hayden Pedro, MD;  Location: [place];  Service: Ophthalmology;  Laterality:  Right;  ENDOLASER  . POLYPECTOMY    . SERUM PATCH Right 08/01/2016   Procedure: SERUM PATCH;  Surgeon: Hayden Pedro, MD;  Location: [place];  Service: Ophthalmology;  Laterality: Right;  . TONSILLECTOMY AND ADENOIDECTOMY  1970's    FAMHx:  Family History  Problem Relation Age of Onset  . Retinitis pigmentosa Father   . Coronary artery disease Brother 40       Died  . Heart attack Brother   . Heart disease Brother   . Hypertension Mother   . Coronary artery disease Maternal Uncle 81       Died  . Coronary artery disease Maternal Grandmother 59       Died  . Aneurysm Maternal Grandfather 85       Abdominal  . Aneurysm Daughter 16       Cerebral  . Colon polyps Neg Hx   . Colon cancer Neg Hx   . Esophageal cancer Neg Hx   . Stomach cancer Neg Hx   . Rectal cancer Neg Hx     SOCHx:   reports that he has never smoked. He has never used smokeless tobacco. He reports current alcohol use. He reports that he does not use drugs.  ALLERGIES:  No Known Allergies  ROS: Pertinent items noted in HPI and remainder of comprehensive ROS otherwise negative.  HOME MEDS: Current Outpatient Medications on File Prior to Visit  Medication Sig Dispense Refill  . ACCU-CHEK AVIVA PLUS test strip 1 each by Other route as needed.     Marland Kitchen ADVAIR DISKUS 250-50 MCG/DOSE AEPB Inhale 1 puff into the lungs daily.     Marland Kitchen albuterol (PROVENTIL HFA;VENTOLIN HFA) 108 (90 Base) MCG/ACT inhaler Inhale 1 puff into the lungs every 6 (six) hours as needed for wheezing or shortness of breath.    Marland Kitchen aspirin 81 MG tablet Take 81 mg by mouth daily.    Marland Kitchen azelastine (ASTELIN) 0.1 % nasal spray Place 1 spray into the nose daily.     . Coenzyme Q10 200 MG capsule Take 200 mg by mouth daily.    . Continuous Blood Gluc Sensor (DEXCOM G6 SENSOR) MISC     . insulin degludec (TRESIBA) 100 UNIT/ML FlexTouch Pen Inject 38 Units into the skin every morning.     . insulin lispro (HUMALOG) 100 UNIT/ML injection Inject 1-20 Units  into the skin 3 (three) times daily before meals. 1 unit per 4 carbs    . losartan (COZAAR) 25 MG tablet Take 1 tablet (25 mg total) by mouth daily. 90 tablet 3  . nitroGLYCERIN (NITROSTAT) 0.4 MG SL tablet PLACE 1 TABLET UNDER THE TONGUE EVERY 5 (FIVE) MINUTES AS NEEDED FOR CHEST PAIN AS DIRECTED 25 tablet 1  . omeprazole (PRILOSEC) 20 MG capsule Take 1 capsule by mouth daily.    . Semaglutide, 1 MG/DOSE, (OZEMPIC, 1 MG/DOSE,) 2 MG/1.5ML SOPN Inject 1 mg  into the skin every Monday.      No current facility-administered medications on file prior to visit.    LABS/IMAGING: No results found for this or any previous visit (from the past 48 hour(s)). No results found.  LIPID PANEL:    Component Value Date/Time   CHOL 109 (L) 07/10/2016 0919   TRIG 47 07/10/2016 0919   HDL 43 07/10/2016 0919   CHOLHDL 2.5 07/10/2016 0919   VLDL 9 07/10/2016 0919   LDLCALC 57 07/10/2016 0919   LDLDIRECT 156 (H) 11/15/2020 1006    WEIGHTS: Wt Readings from Last 3 Encounters:  02/21/21 243 lb 9.6 oz (110.5 kg)  02/16/21 238 lb (108 kg)  11/15/20 235 lb 3.2 oz (106.7 kg)    VITALS: BP (!) 160/83   Pulse (!) 110   Ht 5\' 11"  (1.803 m)   Wt 243 lb 9.6 oz (110.5 kg)   SpO2 96%   BMI 33.98 kg/m   EXAM: General appearance: alert and no distress Lungs: clear to auscultation bilaterally Heart: regular rate and rhythm Extremities: extremities normal, atraumatic, no cyanosis or edema Neurologic: Grossly normal  EKG: Deferred  ASSESSMENT: 1. Mixed dyslipidemia, goal LDL less than 70 2. Statin intolerant-myalgias 3. Type 1 diabetes since age 4 4. Hypertension 5. Coronary artery disease with prior PCI  PLAN: 1.   Mr. Veva Holes has a mixed dyslipidemia and remains well above a target LDL less than 70.  He has been intolerant of high potency rosuvastatin and atorvastatin.  We will try a lower dose 5 mg rosuvastatin which I think he will tolerate but he will not reach his target based on that and I  have recommended we pursue a PCSK9 inhibitor.  Will Rx for Repatha 140 mg every 2 weeks.  I discussed the use of it today as well as potential side effects and he is in agreement.  We will obtain prior authorization and plan repeat lipids in about 3 to 4 months and follow-up with me at that time.  Thanks again for the kind referral.  Pixie Casino, MD, FACC, [place] Director of the [facility] Lipid Disorders &  Cardiovascular Risk Reduction Clinic Diplomate of the American Board of Clinical Lipidology Attending Cardiologist  Direct Dial: [phone]  Fax: [phone]  Website:  [email or web address] 02/21/2021, 8:43 AM

## 2021-02-21 NOTE — Addendum Note (Signed)
Addended by: Fidel Levy on: 02/21/2021 01:59 PM   Modules accepted: Orders

## 2021-02-21 NOTE — Telephone Encounter (Signed)
Rx changed to Praluent 150mg /mL q2weeks & sent to pharmacy Updated on [facility] MyChart message sent to patient

## 2021-02-21 NOTE — Patient Instructions (Addendum)
Medication Instructions:  START: Praluent 150mg /mL INJECTED EVERY 2 WEEKS.  DECREASE: CRESTOR TO 5mg  DAILY  Dr. Debara Pickett recommends Praluent (PCSK9). This is an injectable cholesterol medication self-administered once every 14 days. This medication will likely need prior approval with your insurance company, which we will work on. If the medication is not approved initially, we may need to do an appeal with your insurance.   Administer medication in area of fatty tissue such as abdomen, outer thigh, back of upper arm - and rotate site with each injection Store medication in refrigerator until ready to administer - allow to sit at room temp for 30 mins - 1 hour prior to injection Dispose of medication in a SHARPS container - your pharmacy should be able to direct you on this and proper disposal   If you need co-pay assistance grant, please look into the program at healthwellfoundation.org >> disease funds >> hypercholesterolemia. This is an online application or you can call to complete. Once approved, you will provide the "pharmacy card" information to your pharmacy and they will deduct the co-pays from this grant.  If you need a co-pay card for Repatha: [email or web address] >> paying for Repatha or red box that says "Repatha Copay Card" in top right If you need a co-pay card for Praluent: WedMap.it >> starting & paying for Praluent  *If you need a refill on your cardiac medications before your next appointment, please call your pharmacy*  Lab Work: REPEAT LIPIDS (CHOLESTEROL) IN 3-4 MONTHS. PLEASE RETURN FOR THIS. YOU WILL NEED TO FAST PRIOR  If you have labs (blood work) drawn today and your tests are completely normal, you will receive your results only by: Marland Kitchen MyChart Message (if you have MyChart) OR . A paper copy in the mail If you have any lab test that is abnormal or we need to change your treatment, we will call you to review the results.  Follow-Up: At [facility], you and your health needs  are our priority.  As part of our continuing mission to provide you with exceptional heart care, we have created designated Provider Care Teams.  These Care Teams include your primary Cardiologist (physician) and Advanced Practice Providers (APPs -  Physician Assistants and Nurse Practitioners) who all work together to provide you with the care you need, when you need it.  Your next appointment:   3-4  MONTHS- LIPID CLINIC  The format for your next appointment:   In Person  Provider:   Dr. Debara Pickett

## 2021-02-21 NOTE — Telephone Encounter (Signed)
Patient prescribed Repatha Sureclick  Preferred drug with insurance is Engineer, materials message sent to MD to [place] change

## 2021-02-22 NOTE — Telephone Encounter (Signed)
Called pharmacy, advised that I was not aware of the fax, but Dr.Hilty's primary nurse would be back tomorrow and she would check in on this as she would be the one who would have received it.   They were thankful for update.   Will route to pharmacy.

## 2021-02-22 NOTE — Telephone Encounter (Signed)
[name] with [facility] states they faxed a Prior Authorization form for Repatha to the office yesterday. She would like to confirm that it has been received.  Phone #: [phone]

## 2021-02-22 NOTE — Telephone Encounter (Signed)
PA for Praluent submitted via [place] form Key: [id number]

## 2021-02-24 NOTE — Telephone Encounter (Signed)
Spoke with insurance specialty prior authorization rep @ [phone] and completed PA over the phone - [facility]  Original PA submitted via CMM but went to either medical benefits or non-specialty med dept  PA # [id number]

## 2021-02-24 NOTE — Telephone Encounter (Signed)
Kiersten from [employer] the PA is for [employer] not repatha. She states as far as they are aware the PA has not been received.

## 2021-02-24 NOTE — Telephone Encounter (Signed)
This is a dr. Debara Pickett I will route to [place]

## 2021-02-28 ENCOUNTER — Encounter: Payer: Self-pay | Admitting: Gastroenterology

## 2021-02-28 NOTE — Telephone Encounter (Signed)
Patient has been approved for Praluent 150mg /mL from 02/24/21 - 02/24/22

## 2021-03-02 ENCOUNTER — Encounter: Payer: Self-pay | Admitting: Gastroenterology

## 2021-03-02 ENCOUNTER — Other Ambulatory Visit: Payer: Self-pay

## 2021-03-02 ENCOUNTER — Ambulatory Visit (AMBULATORY_SURGERY_CENTER): Payer: 59 | Admitting: Gastroenterology

## 2021-03-02 VITALS — BP 120/67 | HR 75 | Temp 97.8°F | Resp 22 | Ht 71.0 in | Wt 238.0 lb

## 2021-03-02 DIAGNOSIS — Z86010 Personal history of colonic polyps: Secondary | ICD-10-CM

## 2021-03-02 DIAGNOSIS — D120 Benign neoplasm of cecum: Secondary | ICD-10-CM

## 2021-03-02 DIAGNOSIS — D122 Benign neoplasm of ascending colon: Secondary | ICD-10-CM

## 2021-03-02 MED ORDER — SODIUM CHLORIDE 0.9 % IV SOLN: 500.0000 mL | Freq: Once | INTRAVENOUS | Status: DC

## 2021-03-02 NOTE — Patient Instructions (Signed)
Please read handouts provided. Continue present medications. Await pathology results.   YOU HAD AN ENDOSCOPIC PROCEDURE TODAY AT THE [facility]:   Refer to the procedure report that was given to you for any specific questions about what was found during the examination.  If the procedure report does not answer your questions, please call your gastroenterologist to clarify.  If you requested that your care partner not be given the details of your procedure findings, then the procedure report has been included in a sealed envelope for you to review at your convenience later.  YOU SHOULD EXPECT: Some feelings of bloating in the abdomen. Passage of more gas than usual.  Walking can help get rid of the air that was put into your GI tract during the procedure and reduce the bloating. If you had a lower endoscopy (such as a colonoscopy or flexible sigmoidoscopy) you may notice spotting of blood in your stool or on the toilet paper. If you underwent a bowel prep for your procedure, you may not have a normal bowel movement for a few days.  Please Note:  You might notice some irritation and congestion in your nose or some drainage.  This is from the oxygen used during your procedure.  There is no need for concern and it should clear up in a day or so.  SYMPTOMS TO REPORT IMMEDIATELY:  Following lower endoscopy (colonoscopy or flexible sigmoidoscopy):  Excessive amounts of blood in the stool  Significant tenderness or worsening of abdominal pains  Swelling of the abdomen that is new, acute  Fever of 100F or higher   For urgent or emergent issues, a gastroenterologist can be reached at any hour by calling [phone]. Do not use MyChart messaging for urgent concerns.    DIET:  We do recommend a small meal at first, but then you may proceed to your regular diet.  Drink plenty of fluids but you should avoid alcoholic beverages for 24 hours.  ACTIVITY:  You should plan to take it easy  for the rest of today and you should NOT DRIVE or use heavy machinery until tomorrow (because of the sedation medicines used during the test).    FOLLOW UP: Our staff will call the number listed on your records 48-72 hours following your procedure to check on you and address any questions or concerns that you may have regarding the information given to you following your procedure. If we do not reach you, we will leave a message.  We will attempt to reach you two times.  During this call, we will ask if you have developed any symptoms of COVID 19. If you develop any symptoms (ie: fever, flu-like symptoms, shortness of breath, cough etc.) before then, please call [phone].  If you test positive for Covid 19 in the 2 weeks post procedure, please call and report this information to us.    If any biopsies were taken you will be contacted by phone or by letter within the next 1-3 weeks.  Please call us at [phone] if you have not heard about the biopsies in 3 weeks.    SIGNATURES/CONFIDENTIALITY: You and/or your care partner have signed paperwork which will be entered into your electronic medical record.  These signatures attest to the fact that that the information above on your After Visit Summary has been reviewed and is understood.  Full responsibility of the confidentiality of this discharge information lies with you and/or your care-partner.  

## 2021-03-02 NOTE — Progress Notes (Signed)
To PACU, VSS. Report to [email or web address] 

## 2021-03-02 NOTE — Progress Notes (Signed)
Called to room to assist during endoscopic procedure.  Patient ID and intended procedure confirmed with present staff. Received instructions for my participation in the procedure from the performing physician.  

## 2021-03-02 NOTE — Op Note (Signed)
[facility] Patient Name: [name] Procedure Date: 03/02/2021 8:06 AM MRN: [id number] Endoscopist: Mauri Pole , MD Age: 55 Referring MD:  Date of Birth: [birth year] Gender: Male Account #: [id number] Procedure:                Colonoscopy Indications:              High risk colon cancer surveillance: Personal                            history of colonic polyps, High risk colon cancer                            surveillance: Personal history of multiple (3 or                            more) adenomas Medicines:                Monitored Anesthesia Care Procedure:                Pre-Anesthesia Assessment:                           - Prior to the procedure, a History and Physical                            was performed, and patient medications and                            allergies were reviewed. The patient's tolerance of                            previous anesthesia was also reviewed. The risks                            and benefits of the procedure and the sedation                            options and risks were discussed with the patient.                            All questions were answered, and informed consent                            was obtained. Prior Anticoagulants: The patient has                            taken no previous anticoagulant or antiplatelet                            agents. ASA Grade Assessment: III - A patient with                            severe systemic disease. After reviewing the risks  and benefits, the patient was deemed in                            satisfactory condition to undergo the procedure.                           After obtaining informed consent, the colonoscope                            was passed under direct vision. Throughout the                            procedure, the patient's blood pressure, pulse, and                            oxygen saturations were monitored continuously.  The                            Olympus [id number]-[id number]) Colonoscope was                            introduced through the anus and advanced to the the                            cecum, identified by appendiceal orifice and                            ileocecal valve. The colonoscopy was performed                            without difficulty. The patient tolerated the                            procedure well. The quality of the bowel                            preparation was adequate. The ileocecal valve,                            appendiceal orifice, and rectum were photographed. Scope In: 8:09:23 AM Scope Out: 8:28:12 AM Scope Withdrawal Time: 0 hours 9 minutes 3 seconds  Total Procedure Duration: 0 hours 18 minutes 49 seconds  Findings:                 The perianal and digital rectal examinations were                            normal.                           Three sessile polyps were found in the ascending                            colon and cecum. The polyps were 1 to 2 mm in size.  These polyps were removed with a cold biopsy                            forceps. Resection and retrieval were complete.                           Non-bleeding external and internal hemorrhoids were                            found during retroflexion. The hemorrhoids were                            medium-sized. Complications:            No immediate complications. Estimated Blood Loss:     Estimated blood loss was minimal. Impression:               - Three 1 to 2 mm polyps in the ascending colon and                            in the cecum, removed with a cold biopsy forceps.                            Resected and retrieved.                           - Non-bleeding external and internal hemorrhoids. Recommendation:           - Patient has a contact number available for                            emergencies. The signs and symptoms of potential                             delayed complications were discussed with the                            patient. Return to normal activities tomorrow.                            Written discharge instructions were provided to the                            patient.                           - Resume previous diet.                           - Continue present medications.                           - Await pathology results.                           - Repeat colonoscopy in 3 - 5 years for  surveillance based on pathology results. Mauri Pole, MD 03/02/2021 8:37:01 AM This report has been signed electronically.

## 2021-03-02 NOTE — Progress Notes (Signed)
Pt's states no medical or surgical changes since previsit or office visit.   VS taken by CW 

## 2021-03-04 ENCOUNTER — Telehealth: Payer: Self-pay

## 2021-03-04 NOTE — Telephone Encounter (Signed)
LVM

## 2021-03-11 ENCOUNTER — Encounter: Payer: Self-pay | Admitting: Gastroenterology

## 2021-03-24 ENCOUNTER — Other Ambulatory Visit: Payer: Self-pay | Admitting: Cardiology

## 2021-03-31 ENCOUNTER — Encounter (INDEPENDENT_AMBULATORY_CARE_PROVIDER_SITE_OTHER): Payer: 59 | Admitting: Ophthalmology

## 2021-03-31 ENCOUNTER — Other Ambulatory Visit: Payer: Self-pay

## 2021-03-31 DIAGNOSIS — I10 Essential (primary) hypertension: Secondary | ICD-10-CM

## 2021-03-31 DIAGNOSIS — H35033 Hypertensive retinopathy, bilateral: Secondary | ICD-10-CM

## 2021-03-31 DIAGNOSIS — H43812 Vitreous degeneration, left eye: Secondary | ICD-10-CM | POA: Diagnosis not present

## 2021-03-31 DIAGNOSIS — E103593 Type 1 diabetes mellitus with proliferative diabetic retinopathy without macular edema, bilateral: Secondary | ICD-10-CM | POA: Diagnosis not present

## 2021-03-31 DIAGNOSIS — H2512 Age-related nuclear cataract, left eye: Secondary | ICD-10-CM

## 2021-05-06 ENCOUNTER — Telehealth: Payer: Self-pay | Admitting: Cardiology

## 2021-05-06 NOTE — Telephone Encounter (Signed)
This RN called patient, pt reports shortness of breath for "a month or two". Pt reports his SOB only happens "sometimes" and is worse when he is walking/moving around. Pt reports his weights have been consistently around 237 in the mornings, he denies a weight gain of 3 pounds/day, 5 pounds/week. He does reports "a little" increased swelling in his feet and ankles. His blood pressure today is 137/86, he does report he has been taking his medications as directed. He reports he took his albuterol PRN as directed today and it did help "a little". Pt says his only other symptom is fatigue, and denies chest pain or dizziness.   This RN made pt an appointment with Laurann Montana, NP at 8:30am on 7/25. Pt also instructed to call 911 or be taken to the emergency room should be develop chest pain, worsening shortness of breath, or other concerning symptoms. Pt verbalized understanding.

## 2021-05-06 NOTE — Telephone Encounter (Signed)
Pt c/o Shortness Of Breath: STAT if SOB developed within the last 24 hours or pt is noticeably SOB on the phone  1. Are you currently SOB (can you hear that pt is SOB on the phone)?  No   2. How long have you been experiencing SOB?  About 1.5 weeks   3. Are you SOB when sitting or when up moving around?  When up and moving around   4. Are you currently experiencing any other symptoms?  Fatigue--about 1 month

## 2021-05-15 NOTE — Progress Notes (Signed)
Office Visit    Patient Name: [name] Date of Encounter: 05/16/2021  PCP:  Maris Berger, [place]  Cardiologist:  Minus Breeding, MD  Advanced Practice Provider:  No care team member to display Electrophysiologist:  None    Chief Complaint    [name] is a 55 y.o. male with a hx of CAD, HLD, GERD, DM2, HLD presents today for shortness of breath   Past Medical History    Past Medical History:  Diagnosis Date   Coronary artery disease    s/p NSTEMI after ETT-echo - LHC 7/13:  LM < 10%, pLAD 90%, mLAD 95%, pCFX tandem 90%, mCFX 80-90%, pRCA 30%, mRCA 40-50%;  PCI: Xience DES x 2 to prox and mid LAD; Xience DES x 1 to prox and mid CFX   Family history of anesthesia complication    nausea    GERD (gastroesophageal reflux disease)    HLD (hyperlipidemia)    Hypertension    Lung nodule 2013   CT 7/13: 3 mm RML nodule - felt to be stable and benign/ follow up in 2014   Myocardial infarction [facility]) 2013   have 4 stents   S/P coronary artery stent placement    Type I diabetes mellitus (HCC)    insulin dependent   Past Surgical History:  Procedure Laterality Date   25 GAUGE PARS PLANA VITRECTOMY WITH 20 GAUGE MVR PORT FOR MACULAR HOLE Right 08/01/2016   25 GAUGE PARS PLANA VITRECTOMY WITH 20 GAUGE MVR PORT FOR MACULAR HOLE, MEMBRANE PEEL AND SERUM PATCH WITH ENDOLASER AND C3F8    25 GAUGE PARS PLANA VITRECTOMY WITH 20 GAUGE MVR PORT FOR MACULAR HOLE Right 08/01/2016   Procedure: 25 GAUGE PARS PLANA VITRECTOMY WITH 20 GAUGE MVR PORT FOR MACULAR HOLE, MEMBRANE PEEL AND SERUM PATCH WITH ENDOLASER AND C3F8;  Surgeon: Hayden Pedro, MD;  Location: [facility];  Service: Ophthalmology;  Laterality: Right;   APPENDECTOMY  1980's   CATARACT EXTRACTION  07/2017   right eye   CHOLECYSTECTOMY  ~ 2006   COLONOSCOPY     CORONARY ANGIOPLASTY WITH STENT PLACEMENT  04/2013   LAD   GAS/FLUID EXCHANGE Right 09/16/2013   Procedure: AIR/FLUID EXCHANGE;   Surgeon: Hayden Pedro, MD;  Location: [facility];  Service: Ophthalmology;  Laterality: Right;   GAS/FLUID EXCHANGE Right 08/01/2016   Procedure: GAS/FLUID EXCHANGE;  Surgeon: Hayden Pedro, MD;  Location: [facility];  Service: Ophthalmology;  Laterality: Right;   KNEE ARTHROSCOPY Right ~ 1984   LEFT HEART CATH AND CORONARY ANGIOGRAPHY N/A 10/14/2019   Procedure: LEFT HEART CATH AND CORONARY ANGIOGRAPHY;  Surgeon: Martinique, Peter M, MD;  Location: [facility];  Service: Cardiovascular;  Laterality: N/A;   LEFT HEART CATHETERIZATION WITH CORONARY ANGIOGRAM N/A 05/03/2012   Procedure: LEFT HEART CATHETERIZATION WITH CORONARY ANGIOGRAM;  Surgeon: Peter M Martinique, MD;  Location: [facility];  Service: Cardiovascular;  Laterality: N/A;   MEMBRANE PEEL Right 09/16/2013   Procedure: MEMBRANE PEEL;  Surgeon: Hayden Pedro, MD;  Location: [facility];  Service: Ophthalmology;  Laterality: Right;   PARS PLANA VITRECTOMY Right 09/16/2013   w/membrane peel/notes 11/25/20145   PARS PLANA VITRECTOMY Right 09/16/2013   Procedure: PARS PLANA VITRECTOMY WITH 25 GAUGE;  Surgeon: Hayden Pedro, MD;  Location: [facility];  Service: Ophthalmology;  Laterality: Right;   PHOTOCOAGULATION WITH LASER Right 09/16/2013   Procedure: PHOTOCOAGULATION WITH LASER;  Surgeon: Hayden Pedro,  MD;  Location: [facility];  Service: Ophthalmology;  Laterality: Right;  ENDOLASER   POLYPECTOMY     SERUM PATCH Right 08/01/2016   Procedure: SERUM PATCH;  Surgeon: Hayden Pedro, MD;  Location: [facility];  Service: Ophthalmology;  Laterality: Right;   TONSILLECTOMY AND ADENOIDECTOMY  1970's    Allergies  No Known Allergies  History of Present Illness    [name] is a 55 y.o. male with a hx of CAD s/p multivessel PCI, HLD, HTN, GERD, DM2 last seen 02/2021 by Dr. Debara Pickett.  He has a history of multivessel PCI. Most recent cardiac cath 09/2019 with single vessel occlusive CAD with stent in prox and mid LAD patent and occluded second OM at site  of prior stent with som left-left collaterals and segmental 50% prox to mid RCA stenosis which was unchanged from 2013. He was recommended for medical management.  He was seen 10/2020 by Dr. Lolly Mustache doing overall well. Noted he was more sedentary than previous. Seen by Dr. Debara Pickett 02/2021 and started on Praluent.   He contacted the office noting dyspnea on exertion and was scheduled for follow up today. Shares with me that he had pneumonia in October and again in April both times treated with antibiotics and steroids.  He presents today with his wife. He notes worsening shortness of breath on exertion over the last 4-5 months. Denies no chest pain, pressure, tightness. Notes ankle edema by the end of the day. Denies orthopnea, PND. He does report progressive fatigue. His wife notes that each time he sits down he falls asleep. He does snore and wakes up feeling tired but ha never had a sleep study.   EKGs/Labs/Other Studies Reviewed:   The following studies were reviewed today:  Cardiac cath  10/14/19 Diagnostic Dominance: Co-dominant       EKG:  EKG is ordered today.  The ekg ordered today demonstrates NSR 88 bpm with PVC. Stable TWI inferolateral leads. No change compared to previous.   Recent Labs: No results found for requested labs within last 8760 hours.  Recent Lipid Panel    Component Value Date/Time   CHOL 109 (L) 07/10/2016 0919   TRIG 47 07/10/2016 0919   HDL 43 07/10/2016 0919   CHOLHDL 2.5 07/10/2016 0919   VLDL 9 07/10/2016 0919   LDLCALC 57 07/10/2016 0919   LDLDIRECT 156 (H) 11/15/2020 1006    Home Medications   Current Meds  Medication Sig   ACCU-CHEK AVIVA PLUS test strip 1 each by Other route as needed.    ADVAIR DISKUS 250-50 MCG/DOSE AEPB Inhale 1 puff into the lungs daily.    albuterol (PROVENTIL HFA;VENTOLIN HFA) 108 (90 Base) MCG/ACT inhaler Inhale 1 puff into the lungs every 6 (six) hours as needed for wheezing or shortness of breath.   Alirocumab  (PRALUENT) 150 MG/ML SOAJ Inject 1 Dose into the skin every 14 (fourteen) days.   aspirin 81 MG tablet Take 81 mg by mouth daily.   azelastine (ASTELIN) 0.1 % nasal spray Place 1 spray into the nose daily.    Coenzyme Q10 200 MG capsule Take 200 mg by mouth daily.   Continuous Blood Gluc Sensor (DEXCOM G6 SENSOR) MISC    insulin degludec (TRESIBA) 100 UNIT/ML FlexTouch Pen Inject 38 Units into the skin every morning.    insulin lispro (HUMALOG) 100 UNIT/ML injection Inject 1-20 Units into the skin 3 (three) times daily before meals. 1 unit per 4 carbs   losartan (COZAAR) 25 MG tablet TAKE  1 TABLET(25 MG) BY MOUTH DAILY   nitroGLYCERIN (NITROSTAT) 0.4 MG SL tablet PLACE 1 TABLET UNDER THE TONGUE EVERY 5 (FIVE) MINUTES AS NEEDED FOR CHEST PAIN AS DIRECTED   omeprazole (PRILOSEC) 20 MG capsule Take 1 capsule by mouth daily.   rosuvastatin (CRESTOR) 5 MG tablet Take 1 tablet (5 mg total) by mouth daily at 6 PM.   Semaglutide, 1 MG/DOSE, (OZEMPIC, 1 MG/DOSE,) 2 MG/1.5ML SOPN Inject 1 mg into the skin every Monday.      Review of Systems     All other systems reviewed and are otherwise negative except as noted above.  Physical Exam    VS:  BP 134/82   Pulse 88   Ht '5\' 11"'$  (1.803 m)   Wt 245 lb (111.1 kg)   BMI 34.17 kg/m  , BMI Body mass index is 34.17 kg/m.  Wt Readings from Last 3 Encounters:  05/16/21 245 lb (111.1 kg)  03/02/21 238 lb (108 kg)  02/21/21 243 lb 9.6 oz (110.5 kg)     GEN: Well nourished, well developed, in no acute distress. HEENT: normal. Neck: Supple, no JVD, carotid bruits, or masses. Cardiac: RRR, no murmurs, rubs, or gallops. No clubbing, cyanosis. Trace pedal edema..  Radials/PT 2+ and equal bilaterally.  Respiratory:  Respirations regular and unlabored, clear to auscultation bilaterally. GI: Soft, nontender, nondistended. MS: No deformity or atrophy. Skin: Warm and dry, no rash. Neuro:  Strength and sensation are intact. Psych: Normal  affect.  Assessment & Plan    CAD - No chest pain. Notes dyspnea on exertion worsening over last 3 months. Has not taken nitroglycerin. LHC 10/14/19 with recommendation for medical management. EKG today no acute St/T wave changes. Start Imdur '30mg'$  QHS for anti-anginal benefit.   DOE / LE edema - Reports 3-4 month history of worsening dyspnea on exertion as well as bilateral pedal edema. BMP, CBC, BNP today. Plan for echocardiogram to rule out heart failure. If echo with newly reduced LVEF, plan to proceed with [facility] and initiate GDMT.  Snores / Daytime fatigue - Never with sleep study. Risk factors for OSA include weight, hypertension. Plan to order sleep study at follow up. Discussion initiated today.  HLD, LDL goal <70 - Follows with Dr. Debara Pickett of the lipid clinic. 11/15/20 LDL 156. Recently started on Praluent. Tolerating well.  HTN - BP reasonably well controlled. Continue Losartan '25mg'$  QD. Start Imdur '30mg'$  QHS, as above.  DM2 - 09/2020 A1c 6.6. Continue to follow with PCP and endocrinology.  Disposition: Follow up  after echocardiogram  with Dr. [name] or APP.  Signed, [name], NP 05/16/2021, 8:55 AM [place]

## 2021-05-16 ENCOUNTER — Ambulatory Visit (HOSPITAL_BASED_OUTPATIENT_CLINIC_OR_DEPARTMENT_OTHER): Payer: 59 | Admitting: Family

## 2021-05-16 ENCOUNTER — Encounter (HOSPITAL_BASED_OUTPATIENT_CLINIC_OR_DEPARTMENT_OTHER): Payer: Self-pay | Admitting: Family

## 2021-05-16 ENCOUNTER — Other Ambulatory Visit: Payer: Self-pay

## 2021-05-16 VITALS — BP 134/82 | HR 88 | Ht 71.0 in | Wt 245.0 lb

## 2021-05-16 DIAGNOSIS — G4730 Sleep apnea, unspecified: Secondary | ICD-10-CM

## 2021-05-16 DIAGNOSIS — R0683 Snoring: Secondary | ICD-10-CM

## 2021-05-16 DIAGNOSIS — E785 Hyperlipidemia, unspecified: Secondary | ICD-10-CM | POA: Diagnosis not present

## 2021-05-16 DIAGNOSIS — R0609 Other forms of dyspnea: Secondary | ICD-10-CM

## 2021-05-16 DIAGNOSIS — I10 Essential (primary) hypertension: Secondary | ICD-10-CM

## 2021-05-16 DIAGNOSIS — I25118 Atherosclerotic heart disease of native coronary artery with other forms of angina pectoris: Secondary | ICD-10-CM | POA: Diagnosis not present

## 2021-05-16 DIAGNOSIS — R0600 Dyspnea, unspecified: Secondary | ICD-10-CM | POA: Diagnosis not present

## 2021-05-16 LAB — COMPREHENSIVE METABOLIC PANEL
ALT: 42 IU/L (ref 0–44)
AST: 37 IU/L (ref 0–40)
Albumin/Globulin Ratio: 2 (ref 1.2–2.2)
Albumin: 4.3 g/dL (ref 3.8–4.9)
Alkaline Phosphatase: 69 IU/L (ref 44–121)
BUN/Creatinine Ratio: 11 (ref 9–20)
BUN: 10 mg/dL (ref 6–24)
Bilirubin Total: 0.4 mg/dL (ref 0.0–1.2)
CO2: 22 mmol/L (ref 20–29)
Calcium: 9.5 mg/dL (ref 8.7–10.2)
Chloride: 106 mmol/L (ref 96–106)
Creatinine, Ser: 0.9 mg/dL (ref 0.76–1.27)
Globulin, Total: 2.2 g/dL (ref 1.5–4.5)
Glucose: 95 mg/dL (ref 65–99)
Potassium: 4.2 mmol/L (ref 3.5–5.2)
Sodium: 142 mmol/L (ref 134–144)
Total Protein: 6.5 g/dL (ref 6.0–8.5)
eGFR: 100 mL/min/{1.73_m2} (ref >59)

## 2021-05-16 MED ORDER — ISOSORBIDE MONONITRATE ER 30 MG PO TB24: 30.0000 mg | ORAL_TABLET | Freq: Every day | ORAL | 3 refills | Status: DC

## 2021-05-16 NOTE — Patient Instructions (Addendum)
Medication Instructions:  Your physician has recommended you make the following change in your medication:   START Isosorbide Mononitrate (Imdur) one '30mg'$  tablet daily   *If you need a refill on your cardiac medications before your next appointment, please call your pharmacy*  Lab Work:  Your physician recommends that you return for lab work in: [place], BNP, CBC  If you have labs (blood work) drawn today and your tests are completely normal, you will receive your results only by: [place] (if you have [place]) OR A paper copy in the mail If you have any lab test that is abnormal or we need to change your treatment, we will call you to review the results.   Testing/Procedures: Your physician has requested that you have an echocardiogram. Echocardiography is a painless test that uses sound waves to create images of your heart. It provides your doctor with information about the size and shape of your heart and how well your heart's chambers and valves are working. This procedure takes approximately one hour. There are no restrictions for this procedure.    Follow-Up: At [facility], you and your health needs are our priority.  As part of our continuing mission to provide you with exceptional heart care, we have created designated Provider Care Teams.  These Care Teams include your primary Cardiologist (physician) and Advanced Practice Providers (APPs -  Physician Assistants and Nurse Practitioners) who all work together to provide you with the care you need, when you need it.  We recommend signing up for the patient portal called "MyChart".  Sign up information is provided on this After Visit Summary.  MyChart is used to connect with patients for Virtual Visits (Telemedicine).  Patients are able to view lab/test results, encounter notes, upcoming appointments, etc.  Non-urgent messages can be sent to your provider as well.   To learn more about what you can do with MyChart, go to  [email or web address].    Your next appointment:   After echocardiogram  The format for your next appointment:   In Person  Provider:   You may see Minus Breeding, MD or one of the following Advanced Practice Providers on your designated Care Team:   Rosaria Ferries, PA-C Caron Presume, PA-C Jory Sims, DNP, ANP [name], NP    Other Instructions  Heart Healthy Diet Recommendations: A low-salt diet is recommended. Meats should be grilled, baked, or boiled. Avoid fried foods. Focus on lean protein sources like fish or chicken with vegetables and fruits. The American Heart Association is a Microbiologist!  American Heart Association Diet and Lifeystyle Recommendations    Exercise recommendations: The American Heart Association recommends 150 minutes of moderate intensity exercise weekly. Try 30 minutes of moderate intensity exercise 4-5 times per week. This could include walking, jogging, or swimming.

## 2021-05-17 ENCOUNTER — Ambulatory Visit (INDEPENDENT_AMBULATORY_CARE_PROVIDER_SITE_OTHER): Payer: 59

## 2021-05-17 DIAGNOSIS — R0609 Other forms of dyspnea: Secondary | ICD-10-CM

## 2021-05-17 DIAGNOSIS — I10 Essential (primary) hypertension: Secondary | ICD-10-CM

## 2021-05-17 DIAGNOSIS — I25118 Atherosclerotic heart disease of native coronary artery with other forms of angina pectoris: Secondary | ICD-10-CM

## 2021-05-17 DIAGNOSIS — R0600 Dyspnea, unspecified: Secondary | ICD-10-CM

## 2021-05-17 DIAGNOSIS — E785 Hyperlipidemia, unspecified: Secondary | ICD-10-CM

## 2021-05-17 NOTE — Progress Notes (Signed)
Complete echocardiogram performed.  Jimmy Ruqayyah Lute RDCS, RVT  

## 2021-05-18 ENCOUNTER — Other Ambulatory Visit: Payer: Self-pay

## 2021-05-18 ENCOUNTER — Encounter (HOSPITAL_BASED_OUTPATIENT_CLINIC_OR_DEPARTMENT_OTHER): Payer: Self-pay

## 2021-05-18 DIAGNOSIS — I25118 Atherosclerotic heart disease of native coronary artery with other forms of angina pectoris: Secondary | ICD-10-CM

## 2021-05-18 DIAGNOSIS — I10 Essential (primary) hypertension: Secondary | ICD-10-CM

## 2021-05-18 DIAGNOSIS — E785 Hyperlipidemia, unspecified: Secondary | ICD-10-CM

## 2021-05-18 DIAGNOSIS — R0600 Dyspnea, unspecified: Secondary | ICD-10-CM

## 2021-05-18 DIAGNOSIS — R0609 Other forms of dyspnea: Secondary | ICD-10-CM

## 2021-05-18 LAB — ECHOCARDIOGRAM COMPLETE
Area-P 1/2: 3.65 cm2
S' Lateral: 2.8 cm

## 2021-05-18 NOTE — Telephone Encounter (Signed)
Noted. Thank you.   Haig Gerardo S [place]ath Madding, NP  

## 2021-05-19 ENCOUNTER — Encounter (HOSPITAL_BASED_OUTPATIENT_CLINIC_OR_DEPARTMENT_OTHER): Payer: Self-pay

## 2021-05-19 DIAGNOSIS — R0609 Other forms of dyspnea: Secondary | ICD-10-CM

## 2021-05-19 DIAGNOSIS — R0600 Dyspnea, unspecified: Secondary | ICD-10-CM

## 2021-05-19 DIAGNOSIS — I25118 Atherosclerotic heart disease of native coronary artery with other forms of angina pectoris: Secondary | ICD-10-CM

## 2021-05-19 LAB — PRO B NATRIURETIC PEPTIDE: NT-Pro BNP: 30 pg/mL (ref 0–210)

## 2021-05-19 LAB — CBC
Hematocrit: 42.9 % (ref 37.5–51.0)
Hemoglobin: 14.3 g/dL (ref 13.0–17.7)
MCH: 31.9 pg (ref 26.6–33.0)
MCHC: 33.3 g/dL (ref 31.5–35.7)
MCV: 96 fL (ref 79–97)
Platelets: 326 10*3/uL (ref 150–450)
RBC: 4.48 x10E6/uL (ref 4.14–5.80)
RDW: 12.1 % (ref 11.6–15.4)
WBC: 8.2 10*3/uL (ref 3.4–10.8)

## 2021-05-19 LAB — LIPID PANEL
Chol/HDL Ratio: 1.7 ratio (ref 0.0–5.0)
Cholesterol, Total: 79 mg/dL — ABNORMAL LOW (ref 100–199)
HDL: 46 mg/dL (ref >39)
LDL Chol Calc (NIH): 9 mg/dL (ref 0–99)
Triglycerides: 142 mg/dL (ref 0–149)
VLDL Cholesterol Cal: 24 mg/dL (ref 5–40)

## 2021-05-20 ENCOUNTER — Encounter (HOSPITAL_BASED_OUTPATIENT_CLINIC_OR_DEPARTMENT_OTHER): Payer: Self-pay | Admitting: Family

## 2021-05-20 NOTE — Telephone Encounter (Signed)
Called to discuss continued dyspnea on exertion and exercise intolerance. Echocardiogram was unremarkable. Concern for worsening coronary disease. Plan for lexicsan Myoview. Orders and attestation order placed.    Shared Decision Making/Informed Consent{ The risks [chest pain, shortness of breath, cardiac arrhythmias, dizziness, blood pressure fluctuations, myocardial infarction, stroke/transient ischemic attack, nausea, vomiting, allergic reaction, radiation exposure, metallic taste sensation and life-threatening complications (estimated to be 1 in 10,000)], benefits (risk stratification, diagnosing coronary artery disease, treatment guidance) and alternatives of a nuclear stress test were discussed in detail with [name] and he agrees to proceed.  [name], NP

## 2021-05-20 NOTE — Telephone Encounter (Signed)
Contacted patient 05/20/21 at 1pm in regards to stress test.  No answer, left VM, will try again this afternoon.   [name], NP

## 2021-05-20 NOTE — Addendum Note (Signed)
Addended by: [name] on: 05/20/2021 05:13 PM   Modules accepted: Orders

## 2021-05-23 ENCOUNTER — Telehealth (HOSPITAL_BASED_OUTPATIENT_CLINIC_OR_DEPARTMENT_OTHER): Payer: Self-pay | Admitting: Family

## 2021-05-23 NOTE — Telephone Encounter (Signed)
Spoke with patient regarding the [place] Bouchard that was ordered by Laurann Montana, NP----scheduled Thursday 05/26/21 st 12:45 pm at [facility] (Dr. Rosezella [place] office)----arrival time is 12:30 pm---patient voiced his understanding.

## 2021-05-24 ENCOUNTER — Telehealth (HOSPITAL_COMMUNITY): Payer: Self-pay | Admitting: *Deleted

## 2021-05-24 ENCOUNTER — Other Ambulatory Visit: Payer: Self-pay | Admitting: Cardiology

## 2021-05-24 NOTE — Telephone Encounter (Signed)
Close encounter 

## 2021-05-26 ENCOUNTER — Other Ambulatory Visit: Payer: Self-pay

## 2021-05-26 ENCOUNTER — Ambulatory Visit (HOSPITAL_COMMUNITY)
Admission: RE | Admit: 2021-05-26 | Discharge: 2021-05-26 | Disposition: A | Discharge status: Home / Self Care | Payer: 59 | Source: Ambulatory Visit | Attending: Cardiology | Admitting: Cardiology

## 2021-05-26 DIAGNOSIS — R0600 Dyspnea, unspecified: Secondary | ICD-10-CM | POA: Diagnosis not present

## 2021-05-26 DIAGNOSIS — I25118 Atherosclerotic heart disease of native coronary artery with other forms of angina pectoris: Secondary | ICD-10-CM | POA: Diagnosis present

## 2021-05-26 LAB — MYOCARDIAL PERFUSION IMAGING
LV dias vol: 96 mL (ref 62–150)
LV sys vol: 53 mL
Peak HR: 104 {beats}/min
Rest HR: 88 {beats}/min
SDS: 5
SRS: 4
SSS: 9
TID: 1.26

## 2021-05-26 MED ORDER — TECHNETIUM TC 99M TETROFOSMIN IV KIT
30.8000 | PACK | Freq: Once | INTRAVENOUS | Status: AC | PRN
  Administered 2021-05-26: 30.8 via INTRAVENOUS
  Filled 2021-05-26: qty 31

## 2021-05-26 MED ORDER — REGADENOSON 0.4 MG/5ML IV SOLN
0.4000 mg | Freq: Once | INTRAVENOUS | Status: AC
  Administered 2021-05-26: 0.4 mg via INTRAVENOUS

## 2021-05-26 MED ORDER — TECHNETIUM TC 99M TETROFOSMIN IV KIT
9.9000 | PACK | Freq: Once | INTRAVENOUS | Status: AC | PRN
  Administered 2021-05-26: 9.9 via INTRAVENOUS
  Filled 2021-05-26: qty 10

## 2021-05-27 ENCOUNTER — Telehealth (HOSPITAL_BASED_OUTPATIENT_CLINIC_OR_DEPARTMENT_OTHER): Payer: Self-pay | Admitting: Family

## 2021-05-27 ENCOUNTER — Encounter (HOSPITAL_BASED_OUTPATIENT_CLINIC_OR_DEPARTMENT_OTHER): Payer: Self-pay

## 2021-05-27 MED ORDER — ISOSORBIDE MONONITRATE ER 60 MG PO TB24: 60.0000 mg | ORAL_TABLET | Freq: Every day | ORAL | 1 refills | Status: DC

## 2021-05-27 NOTE — Telephone Encounter (Signed)
Called to review stress test results. Tells me since starting the Isosorbide Mononitrate  he feels 80-85% better. The fatigue is not all the way gone, but is improving.   Blood pressure used to routinely run on the lower end of normal. Over the last month or so it has been back up to 140s. He has had one hypotensive reading on the blood pressure cuff but was asymptomatic.   Reports no chest pain, pressure, tightness. Tells me he is not nearly as winded as previously. He has not resumed exercise as he was nervous.   We will plan to increase Imdur to '60mg'$  QHS and follow up as scheduled.  [name], NP

## 2021-06-03 ENCOUNTER — Ambulatory Visit: Payer: 59 | Admitting: Cardiology

## 2021-06-10 ENCOUNTER — Ambulatory Visit: Payer: 59 | Admitting: Internal Medicine

## 2021-06-13 MED ORDER — PRALUENT 75 MG/ML ~~LOC~~ SOAJ: 1.0000 | SUBCUTANEOUS | 3 refills | Status: DC

## 2021-06-21 NOTE — H&P (View-Only) (Signed)
Office Visit    Patient Name: [name] Date of Encounter: 06/21/2021  PCP:  Maris Berger, [place]  Cardiologist:  Minus Breeding, MD  Advanced Practice Provider:  No care team member to display Electrophysiologist:  None    Chief Complaint    [name] is a 55 y.o. male with a hx of CAD, HLD, GERD, DM2, HLD presents today for follow up after cardiac testing  Past Medical History    Past Medical History:  Diagnosis Date   Coronary artery disease    s/p NSTEMI after ETT-echo - LHC 7/13:  LM < 10%, pLAD 90%, mLAD 95%, pCFX tandem 90%, mCFX 80-90%, pRCA 30%, mRCA 40-50%;  PCI: Xience DES x 2 to prox and mid LAD; Xience DES x 1 to prox and mid CFX   Family history of anesthesia complication    nausea    GERD (gastroesophageal reflux disease)    HLD (hyperlipidemia)    Hypertension    Lung nodule 2013   CT 7/13: 3 mm RML nodule - felt to be stable and benign/ follow up in 2014   Myocardial infarction [facility]) 2013   have 4 stents   S/P coronary artery stent placement    Type I diabetes mellitus (HCC)    insulin dependent   Past Surgical History:  Procedure Laterality Date   25 GAUGE PARS PLANA VITRECTOMY WITH 20 GAUGE MVR PORT FOR MACULAR HOLE Right 08/01/2016   25 GAUGE PARS PLANA VITRECTOMY WITH 20 GAUGE MVR PORT FOR MACULAR HOLE, MEMBRANE PEEL AND SERUM PATCH WITH ENDOLASER AND C3F8    25 GAUGE PARS PLANA VITRECTOMY WITH 20 GAUGE MVR PORT FOR MACULAR HOLE Right 08/01/2016   Procedure: 25 GAUGE PARS PLANA VITRECTOMY WITH 20 GAUGE MVR PORT FOR MACULAR HOLE, MEMBRANE PEEL AND SERUM PATCH WITH ENDOLASER AND C3F8;  Surgeon: Hayden Pedro, MD;  Location: [place];  Service: Ophthalmology;  Laterality: Right;   APPENDECTOMY  1980's   CATARACT EXTRACTION  07/2017   right eye   CHOLECYSTECTOMY  ~ 2006   COLONOSCOPY     CORONARY ANGIOPLASTY WITH STENT PLACEMENT  04/2013   LAD   GAS/FLUID EXCHANGE Right 09/16/2013   Procedure: AIR/FLUID  EXCHANGE;  Surgeon: Hayden Pedro, MD;  Location: [place];  Service: Ophthalmology;  Laterality: Right;   GAS/FLUID EXCHANGE Right 08/01/2016   Procedure: GAS/FLUID EXCHANGE;  Surgeon: Hayden Pedro, MD;  Location: [place];  Service: Ophthalmology;  Laterality: Right;   KNEE ARTHROSCOPY Right ~ 1984   LEFT HEART CATH AND CORONARY ANGIOGRAPHY N/A 10/14/2019   Procedure: LEFT HEART CATH AND CORONARY ANGIOGRAPHY;  Surgeon: Martinique, Peter M, MD;  Location: [place] CV LAB;  Service: Cardiovascular;  Laterality: N/A;   LEFT HEART CATHETERIZATION WITH CORONARY ANGIOGRAM N/A 05/03/2012   Procedure: LEFT HEART CATHETERIZATION WITH CORONARY ANGIOGRAM;  Surgeon: Peter M Martinique, MD;  Location: [facility] CATH LAB;  Service: Cardiovascular;  Laterality: N/A;   MEMBRANE PEEL Right 09/16/2013   Procedure: MEMBRANE PEEL;  Surgeon: Hayden Pedro, MD;  Location: [place];  Service: Ophthalmology;  Laterality: Right;   PARS PLANA VITRECTOMY Right 09/16/2013   w/membrane peel/notes 11/25/20145   PARS PLANA VITRECTOMY Right 09/16/2013   Procedure: PARS PLANA VITRECTOMY WITH 25 GAUGE;  Surgeon: Hayden Pedro, MD;  Location: [place];  Service: Ophthalmology;  Laterality: Right;   PHOTOCOAGULATION WITH LASER Right 09/16/2013   Procedure: PHOTOCOAGULATION WITH LASER;  Surgeon: Chrystie Nose  Zigmund Daniel, MD;  Location: [place];  Service: Ophthalmology;  Laterality: Right;  ENDOLASER   POLYPECTOMY     SERUM PATCH Right 08/01/2016   Procedure: SERUM PATCH;  Surgeon: Hayden Pedro, MD;  Location: [place];  Service: Ophthalmology;  Laterality: Right;   TONSILLECTOMY AND ADENOIDECTOMY  1970's    Allergies  No Known Allergies  History of Present Illness    [name] is a 55 y.o. male with a hx of CAD s/p multivessel PCI, HLD, HTN, GERD, DM2 last seen 05/16/21.  He has a history of multivessel PCI. Most recent cardiac cath 09/2019 with single vessel occlusive CAD with stent in prox and mid LAD patent and occluded second OM at site of  prior stent with som left-left collaterals and segmental 50% prox to mid RCA stenosis which was unchanged from 2013. He was recommended for medical management.  He was seen 10/2020 by Dr. Lolly Mustache doing overall well. Noted he was more sedentary than previous. Seen by Dr. Debara Pickett 02/2021 and started on Praluent.   Seen 05/16/21 noting dyspnea on exertion. This was in setting of pneumonia in both 07/2020 and 01/2021 treated with antibiotics and steroids. He was without chest pain. Noted hypersomnolence and ankle edema by end of day. Imdur started and testing ordered. Subsequent echocardiogram LVEF 55-60%, mild concentric LVH, normal diastolic function, RV normal size and function, normal PASP, no significant valvular disease. Lexiscan 05/26/21 with medium defect of moderate severity with findings consistent with prior MI with peri infarct ischemia deemed intermediate risk study. His Imdur was up-titrated after discussion with Dr. Harrell Gave as his symptoms were improving.   He presents today for follow up. Reports his dyspnea on exertion is improved compared to previous but not yet as good as he would like it. Tells me the dyspnea will be at different times throughout the day. He continues to use his his inhalers. Notes lower extremity edema is improved and plans to resume wearing compression stockings. He has an incentive spirometer form a previous hospitalization and has been using it regularly. Reports no chest pain, pressure, tightness. He is monitoring blood pressure at home and reports.  EKGs/Labs/Other Studies Reviewed:   The following studies were reviewed today:  Myoview  05/26/21 Nuclear stress EF: 45%. The left ventricular ejection fraction is mildly decreased (45-54%). There was no ST segment deviation noted during stress. TID is 1.26, cannot exclude balanced ischemia Defect 1: There is a medium defect of moderate severity present in the mid inferior, mid inferolateral and apical lateral  location. Findings consistent with prior myocardial infarction with peri-infarct ischemia. This is an intermediate risk study.   Intermediate risk study. There is an area in the mid to distal inferolateral wall that worsens with stress, consistent with infarct with peri-infarct ischemia. There is also mild wall motion abnormalities seen in this area with overall preserved EF Echo 05/17/21   1. Left ventricular ejection fraction, by estimation, is 55 to 60%. The  left ventricle has normal function. The left ventricle has no regional  wall motion abnormalities. There is mild concentric left ventricular  hypertrophy. Left ventricular diastolic  parameters were normal.   2. Right ventricular systolic function is normal. The right ventricular  size is normal. There is normal pulmonary artery systolic pressure.   3. The mitral valve is normal in structure. No evidence of mitral valve  regurgitation. No evidence of mitral stenosis.   4. The aortic valve is tricuspid. Aortic valve regurgitation is not  visualized. No aortic  stenosis is present.   5. The inferior vena cava is normal in size with greater than 50%  respiratory variability, suggesting right atrial pressure of 3 mmHg.   FINDINGS   Left Ventricle: Left ventricular ejection fraction, by estimation, is 55  to 60%. The left ventricle has normal function. The left ventricle has no  regional wall motion abnormalities. The left ventricular internal cavity  size was normal in size. There is   mild concentric left ventricular hypertrophy. Left ventricular diastolic  parameters were normal. Normal left ventricular filling pressure.   Right Ventricle: The right ventricular size is normal. No increase in  right ventricular wall thickness. Right ventricular systolic function is  normal. There is normal pulmonary artery systolic pressure. The tricuspid  regurgitant velocity is 2.31 m/s, and   with an assumed right atrial pressure of 3 mmHg, the  estimated right  ventricular systolic pressure is [id number] mmHg.   Cardiac cath  10/14/19 Diagnostic Dominance: Co-dominant       EKG:  EKG is ordered today.  The ekg ordered today demonstrates NSR 88 bpm with PVC. Stable TWI inferolateral leads. No change compared to previous.   Recent Labs: 05/16/2021: ALT 42; BUN 10; Creatinine, Ser 0.90; Potassium 4.2; Sodium 142 05/18/2021: Hemoglobin 14.3; NT-Pro BNP 30; Platelets 326  Recent Lipid Panel    Component Value Date/Time   CHOL 79 (L) 05/18/2021 1700   TRIG 142 05/18/2021 1700   HDL 46 05/18/2021 1700   CHOLHDL 1.7 05/18/2021 1700   CHOLHDL 2.5 07/10/2016 0919   VLDL 9 07/10/2016 0919   LDLCALC 9 05/18/2021 1700   LDLDIRECT 156 (H) 11/15/2020 1006    Home Medications   No outpatient medications have been marked as taking for the 06/22/21 encounter (Appointment) with [name], NP.     Review of Systems     All other systems reviewed and are otherwise negative except as noted above.  Physical Exam    VS:  There were no vitals taken for this visit. , BMI There is no height or weight on file to calculate BMI.  Wt Readings from Last 3 Encounters:  05/26/21 245 lb (111.1 kg)  05/16/21 245 lb (111.1 kg)  03/02/21 238 lb (108 kg)     GEN: Well nourished, well developed, in no acute distress. HEENT: normal. Neck: Supple, no JVD, carotid bruits, or masses. Cardiac: RRR, no murmurs, rubs, or gallops. No clubbing, cyanosis. Trace pedal edema..  Radials/PT 2+ and equal bilaterally.  Respiratory:  Respirations regular and unlabored, clear to auscultation bilaterally. GI: Soft, nontender, nondistended. MS: No deformity or atrophy. Skin: Warm and dry, no rash. Neuro:  Strength and sensation are intact. Psych: Normal affect.  Assessment & Plan    CAD / DOE - LHC 10/14/19 with single vessel occlusive CAD with stent in prox/mid LAD patent, occluded second OM at site of prior stent with some left to right collaterals,  segmental 50% prom to mid RA disease. Myoview 05/26/21 was intermediate risk with findings consistent with peri-infarct ischemia. DOE improved with addition of Imdur, but not resolved. Given persistent anginal symptoms, plan for LHC. GDMT includes Aspirin, Imdur, Rosuvastatin. Pending results of LHC further escalate GDMT to include BB, does not appear he has started previously. He also may benefit from future referral to pulmonology if LHC is unrevealing given 2 episodes of pneumonia and residual DOE over the last 6 months.   Shared Decision Making/Informed Consent The risks [stroke (1 in 1000), death (1 in 18), kidney  failure [usually temporary] (1 in 500), bleeding (1 in 200), allergic reaction [possibly serious] (1 in 200)], benefits (diagnostic support and management of coronary artery disease) and alternatives of a cardiac catheterization were discussed in detail with [name] and he is willing to proceed.   LE edema - Recent echo with normal LVEF. Likely etiology dependent edema and venous insufficiency. Elevation, compression socks, low sodium diet encouraged.  Snores / Daytime fatigue - Never with sleep study. Risk factors for OSA include weight, hypertension. Plan to order sleep study at follow up. Discussion initiated at visit 04/2021 but will defer today as scheduling cardiac cath.  HLD, LDL goal <70 - Follows with Dr. Debara Pickett of the lipid clinic. 11/15/20 LDL 156. Recently started on Praluent. Tolerating well. Follow up as scheduled with Dr. Debara Pickett.  HTN -BP well controlled. Continue current antihypertensive regimen.    DM2 - 09/2020 A1c 6.6. 06/14/21 A1c 7.3. Continue to follow with PCP and endocrinology. PLans to resume insulin pump soon.   Disposition: Follow up 2 weeks after cardiac cath.  Signed, [name], NP 06/21/2021, 8:17 PM [place]

## 2021-06-21 NOTE — Progress Notes (Signed)
Office Visit    Patient Name: [name] Date of Encounter: 06/21/2021  PCP:  Maris Berger, [place]  Cardiologist:  Minus Breeding, MD  Advanced Practice Provider:  No care team member to display Electrophysiologist:  None    Chief Complaint    [name] is a 55 y.o. male with a hx of CAD, HLD, GERD, DM2, HLD presents today for follow up after cardiac testing  Past Medical History    Past Medical History:  Diagnosis Date   Coronary artery disease    s/p NSTEMI after ETT-echo - LHC 7/13:  LM < 10%, pLAD 90%, mLAD 95%, pCFX tandem 90%, mCFX 80-90%, pRCA 30%, mRCA 40-50%;  PCI: Xience DES x 2 to prox and mid LAD; Xience DES x 1 to prox and mid CFX   Family history of anesthesia complication    nausea    GERD (gastroesophageal reflux disease)    HLD (hyperlipidemia)    Hypertension    Lung nodule 2013   CT 7/13: 3 mm RML nodule - felt to be stable and benign/ follow up in 2014   Myocardial infarction [facility]) 2013   have 4 stents   S/P coronary artery stent placement    Type I diabetes mellitus (HCC)    insulin dependent   Past Surgical History:  Procedure Laterality Date   25 GAUGE PARS PLANA VITRECTOMY WITH 20 GAUGE MVR PORT FOR MACULAR HOLE Right 08/01/2016   25 GAUGE PARS PLANA VITRECTOMY WITH 20 GAUGE MVR PORT FOR MACULAR HOLE, MEMBRANE PEEL AND SERUM PATCH WITH ENDOLASER AND C3F8    25 GAUGE PARS PLANA VITRECTOMY WITH 20 GAUGE MVR PORT FOR MACULAR HOLE Right 08/01/2016   Procedure: 25 GAUGE PARS PLANA VITRECTOMY WITH 20 GAUGE MVR PORT FOR MACULAR HOLE, MEMBRANE PEEL AND SERUM PATCH WITH ENDOLASER AND C3F8;  Surgeon: Hayden Pedro, MD;  Location: [place];  Service: Ophthalmology;  Laterality: Right;   APPENDECTOMY  1980's   CATARACT EXTRACTION  07/2017   right eye   CHOLECYSTECTOMY  ~ 2006   COLONOSCOPY     CORONARY ANGIOPLASTY WITH STENT PLACEMENT  04/2013   LAD   GAS/FLUID EXCHANGE Right 09/16/2013   Procedure: AIR/FLUID  EXCHANGE;  Surgeon: Hayden Pedro, MD;  Location: [place];  Service: Ophthalmology;  Laterality: Right;   GAS/FLUID EXCHANGE Right 08/01/2016   Procedure: GAS/FLUID EXCHANGE;  Surgeon: Hayden Pedro, MD;  Location: [place];  Service: Ophthalmology;  Laterality: Right;   KNEE ARTHROSCOPY Right ~ 1984   LEFT HEART CATH AND CORONARY ANGIOGRAPHY N/A 10/14/2019   Procedure: LEFT HEART CATH AND CORONARY ANGIOGRAPHY;  Surgeon: Martinique, Peter M, MD;  Location: [place] CV LAB;  Service: Cardiovascular;  Laterality: N/A;   LEFT HEART CATHETERIZATION WITH CORONARY ANGIOGRAM N/A 05/03/2012   Procedure: LEFT HEART CATHETERIZATION WITH CORONARY ANGIOGRAM;  Surgeon: Peter M Martinique, MD;  Location: [facility] CATH LAB;  Service: Cardiovascular;  Laterality: N/A;   MEMBRANE PEEL Right 09/16/2013   Procedure: MEMBRANE PEEL;  Surgeon: Hayden Pedro, MD;  Location: [place];  Service: Ophthalmology;  Laterality: Right;   PARS PLANA VITRECTOMY Right 09/16/2013   w/membrane peel/notes 11/25/20145   PARS PLANA VITRECTOMY Right 09/16/2013   Procedure: PARS PLANA VITRECTOMY WITH 25 GAUGE;  Surgeon: Hayden Pedro, MD;  Location: [place];  Service: Ophthalmology;  Laterality: Right;   PHOTOCOAGULATION WITH LASER Right 09/16/2013   Procedure: PHOTOCOAGULATION WITH LASER;  Surgeon: Chrystie Nose  Zigmund Daniel, MD;  Location: [place];  Service: Ophthalmology;  Laterality: Right;  ENDOLASER   POLYPECTOMY     SERUM PATCH Right 08/01/2016   Procedure: SERUM PATCH;  Surgeon: Hayden Pedro, MD;  Location: [place];  Service: Ophthalmology;  Laterality: Right;   TONSILLECTOMY AND ADENOIDECTOMY  1970's    Allergies  No Known Allergies  History of Present Illness    [name] is a 55 y.o. male with a hx of CAD s/p multivessel PCI, HLD, HTN, GERD, DM2 last seen 05/16/21.  He has a history of multivessel PCI. Most recent cardiac cath 09/2019 with single vessel occlusive CAD with stent in prox and mid LAD patent and occluded second OM at site of  prior stent with som left-left collaterals and segmental 50% prox to mid RCA stenosis which was unchanged from 2013. He was recommended for medical management.  He was seen 10/2020 by Dr. Lolly Mustache doing overall well. Noted he was more sedentary than previous. Seen by Dr. Debara Pickett 02/2021 and started on Praluent.   Seen 05/16/21 noting dyspnea on exertion. This was in setting of pneumonia in both 07/2020 and 01/2021 treated with antibiotics and steroids. He was without chest pain. Noted hypersomnolence and ankle edema by end of day. Imdur started and testing ordered. Subsequent echocardiogram LVEF 55-60%, mild concentric LVH, normal diastolic function, RV normal size and function, normal PASP, no significant valvular disease. Lexiscan 05/26/21 with medium defect of moderate severity with findings consistent with prior MI with peri infarct ischemia deemed intermediate risk study. His Imdur was up-titrated after discussion with Dr. Harrell Gave as his symptoms were improving.   He presents today for follow up. Reports his dyspnea on exertion is improved compared to previous but not yet as good as he would like it. Tells me the dyspnea will be at different times throughout the day. He continues to use his his inhalers. Notes lower extremity edema is improved and plans to resume wearing compression stockings. He has an incentive spirometer form a previous hospitalization and has been using it regularly. Reports no chest pain, pressure, tightness. He is monitoring blood pressure at home and reports.  EKGs/Labs/Other Studies Reviewed:   The following studies were reviewed today:  Myoview  05/26/21 Nuclear stress EF: 45%. The left ventricular ejection fraction is mildly decreased (45-54%). There was no ST segment deviation noted during stress. TID is 1.26, cannot exclude balanced ischemia Defect 1: There is a medium defect of moderate severity present in the mid inferior, mid inferolateral and apical lateral  location. Findings consistent with prior myocardial infarction with peri-infarct ischemia. This is an intermediate risk study.   Intermediate risk study. There is an area in the mid to distal inferolateral wall that worsens with stress, consistent with infarct with peri-infarct ischemia. There is also mild wall motion abnormalities seen in this area with overall preserved EF Echo 05/17/21   1. Left ventricular ejection fraction, by estimation, is 55 to 60%. The  left ventricle has normal function. The left ventricle has no regional  wall motion abnormalities. There is mild concentric left ventricular  hypertrophy. Left ventricular diastolic  parameters were normal.   2. Right ventricular systolic function is normal. The right ventricular  size is normal. There is normal pulmonary artery systolic pressure.   3. The mitral valve is normal in structure. No evidence of mitral valve  regurgitation. No evidence of mitral stenosis.   4. The aortic valve is tricuspid. Aortic valve regurgitation is not  visualized. No aortic  stenosis is present.   5. The inferior vena cava is normal in size with greater than 50%  respiratory variability, suggesting right atrial pressure of 3 mmHg.   FINDINGS   Left Ventricle: Left ventricular ejection fraction, by estimation, is 55  to 60%. The left ventricle has normal function. The left ventricle has no  regional wall motion abnormalities. The left ventricular internal cavity  size was normal in size. There is   mild concentric left ventricular hypertrophy. Left ventricular diastolic  parameters were normal. Normal left ventricular filling pressure.   Right Ventricle: The right ventricular size is normal. No increase in  right ventricular wall thickness. Right ventricular systolic function is  normal. There is normal pulmonary artery systolic pressure. The tricuspid  regurgitant velocity is 2.31 m/s, and   with an assumed right atrial pressure of 3 mmHg, the  estimated right  ventricular systolic pressure is [id number] mmHg.   Cardiac cath  10/14/19 Diagnostic Dominance: Co-dominant       EKG:  EKG is ordered today.  The ekg ordered today demonstrates NSR 88 bpm with PVC. Stable TWI inferolateral leads. No change compared to previous.   Recent Labs: 05/16/2021: ALT 42; BUN 10; Creatinine, Ser 0.90; Potassium 4.2; Sodium 142 05/18/2021: Hemoglobin 14.3; NT-Pro BNP 30; Platelets 326  Recent Lipid Panel    Component Value Date/Time   CHOL 79 (L) 05/18/2021 1700   TRIG 142 05/18/2021 1700   HDL 46 05/18/2021 1700   CHOLHDL 1.7 05/18/2021 1700   CHOLHDL 2.5 07/10/2016 0919   VLDL 9 07/10/2016 0919   LDLCALC 9 05/18/2021 1700   LDLDIRECT 156 (H) 11/15/2020 1006    Home Medications   No outpatient medications have been marked as taking for the 06/22/21 encounter (Appointment) with [name], NP.     Review of Systems     All other systems reviewed and are otherwise negative except as noted above.  Physical Exam    VS:  There were no vitals taken for this visit. , BMI There is no height or weight on file to calculate BMI.  Wt Readings from Last 3 Encounters:  05/26/21 245 lb (111.1 kg)  05/16/21 245 lb (111.1 kg)  03/02/21 238 lb (108 kg)     GEN: Well nourished, well developed, in no acute distress. HEENT: normal. Neck: Supple, no JVD, carotid bruits, or masses. Cardiac: RRR, no murmurs, rubs, or gallops. No clubbing, cyanosis. Trace pedal edema..  Radials/PT 2+ and equal bilaterally.  Respiratory:  Respirations regular and unlabored, clear to auscultation bilaterally. GI: Soft, nontender, nondistended. MS: No deformity or atrophy. Skin: Warm and dry, no rash. Neuro:  Strength and sensation are intact. Psych: Normal affect.  Assessment & Plan    CAD / DOE - LHC 10/14/19 with single vessel occlusive CAD with stent in prox/mid LAD patent, occluded second OM at site of prior stent with some left to right collaterals,  segmental 50% prom to mid RA disease. Myoview 05/26/21 was intermediate risk with findings consistent with peri-infarct ischemia. DOE improved with addition of Imdur, but not resolved. Given persistent anginal symptoms, plan for LHC. GDMT includes Aspirin, Imdur, Rosuvastatin. Pending results of LHC further escalate GDMT to include BB, does not appear he has started previously. He also may benefit from future referral to pulmonology if LHC is unrevealing given 2 episodes of pneumonia and residual DOE over the last 6 months.   Shared Decision Making/Informed Consent The risks [stroke (1 in 1000), death (1 in 51), kidney  failure [usually temporary] (1 in 500), bleeding (1 in 200), allergic reaction [possibly serious] (1 in 200)], benefits (diagnostic support and management of coronary artery disease) and alternatives of a cardiac catheterization were discussed in detail with Mr. Hagge and he is willing to proceed.   LE edema - Recent echo with normal LVEF. Likely etiology dependent edema and venous insufficiency. Elevation, compression socks, low sodium diet encouraged.  Snores / Daytime fatigue - Never with sleep study. Risk factors for OSA include weight, hypertension. Plan to order sleep study at follow up. Discussion initiated at visit 04/2021 but will defer today as scheduling cardiac cath.  HLD, LDL goal <70 - Follows with Dr. Debara Pickett of the lipid clinic. 11/15/20 LDL 156. Recently started on Praluent. Tolerating well. Follow up as scheduled with Dr. Debara Pickett.  HTN -BP well controlled. Continue current antihypertensive regimen.    DM2 - 09/2020 A1c 6.6. 06/14/21 A1c 7.3. Continue to follow with PCP and endocrinology. PLans to resume insulin pump soon.   Disposition: Follow up 2 weeks after cardiac cath.  Signed, [name], NP 06/21/2021, 8:17 PM [place]

## 2021-06-22 ENCOUNTER — Ambulatory Visit (INDEPENDENT_AMBULATORY_CARE_PROVIDER_SITE_OTHER): Payer: 59 | Admitting: Family

## 2021-06-22 ENCOUNTER — Encounter (HOSPITAL_BASED_OUTPATIENT_CLINIC_OR_DEPARTMENT_OTHER): Payer: Self-pay | Admitting: Family

## 2021-06-22 ENCOUNTER — Other Ambulatory Visit: Payer: Self-pay

## 2021-06-22 VITALS — BP 130/78 | HR 87 | Ht 71.0 in | Wt 248.0 lb

## 2021-06-22 DIAGNOSIS — T466X5A Adverse effect of antihyperlipidemic and antiarteriosclerotic drugs, initial encounter: Secondary | ICD-10-CM

## 2021-06-22 DIAGNOSIS — E785 Hyperlipidemia, unspecified: Secondary | ICD-10-CM | POA: Diagnosis not present

## 2021-06-22 DIAGNOSIS — I25118 Atherosclerotic heart disease of native coronary artery with other forms of angina pectoris: Secondary | ICD-10-CM

## 2021-06-22 DIAGNOSIS — M791 Myalgia, unspecified site: Secondary | ICD-10-CM

## 2021-06-22 DIAGNOSIS — I10 Essential (primary) hypertension: Secondary | ICD-10-CM | POA: Diagnosis not present

## 2021-06-22 DIAGNOSIS — T466X5D Adverse effect of antihyperlipidemic and antiarteriosclerotic drugs, subsequent encounter: Secondary | ICD-10-CM

## 2021-06-22 DIAGNOSIS — R0609 Other forms of dyspnea: Secondary | ICD-10-CM

## 2021-06-22 DIAGNOSIS — R0600 Dyspnea, unspecified: Secondary | ICD-10-CM

## 2021-06-22 NOTE — Patient Instructions (Signed)
Medication Instructions:  Continue your current medications.   *If you need a refill on your cardiac medications before your next appointment, please call your pharmacy*   Lab Work: Your physician recommends that you return for lab work at [employer] for [employer] [place] on Friday. You do not need to be fasting.   If you have labs (blood work) drawn today and your tests are completely normal, you will receive your results only by: [place] (if you have MyChart) OR A paper copy in the mail If you have any lab test that is abnormal or we need to change your treatment, we will call you to review the results.   Testing/Procedures: Your physician has requested that you have a cardiac catheterization. Cardiac catheterization is used to diagnose and/or treat various heart conditions. Doctors may recommend this procedure for a number of different reasons. The most common reason is to evaluate chest pain. Chest pain can be a symptom of coronary artery disease (CAD), and cardiac catheterization can show whether plaque is narrowing or blocking your heart's arteries. This procedure is also used to evaluate the valves, as well as measure the blood flow and oxygen levels in different parts of your heart. Please follow instruction sheet, as given.  Follow-Up: At [facility], you and your health needs are our priority.  As part of our continuing mission to provide you with exceptional heart care, we have created designated Provider Care Teams.  These Care Teams include your primary Cardiologist (physician) and Advanced Practice Providers (APPs -  Physician Assistants and Nurse Practitioners) who all work together to provide you with the care you need, when you need it.  We recommend signing up for the patient portal called "MyChart".  Sign up information is provided on this After Visit Summary.  MyChart is used to connect with patients for Virtual Visits (Telemedicine).  Patients are able to view lab/test  results, encounter notes, upcoming appointments, etc.  Non-urgent messages can be sent to your provider as well.   To learn more about what you can do with MyChart, go to [email or web address].    Your next appointment:   2 weeks after cardiac catheterization  The format for your next appointment:   In Person  Provider:   You may see Minus Breeding, MD or one of the following Advanced Practice Providers on your designated Care Team:   Rosaria Ferries, PA-C Caron Presume, PA-C Jory Sims, DNP, ANP   Other Instructions   [place] [id number] Dept: [place]  06/22/2021  You are scheduled for a Cardiac Catheterization on Friday, September 9 with Dr. Lauree Chandler.  1. Please arrive at the [facility] (Main Entrance A) at [facility]: [address] [place], [place] [address] at 8:30 AM (This time is two hours before your procedure to ensure your preparation). Free valet parking service is available.   Special note: Every effort is made to have your procedure done on time. Please understand that emergencies sometimes delay scheduled procedures.  2. Diet: Do not eat solid foods after midnight.  The patient may have clear liquids until 5am upon the day of the procedure.  3. Labs: You will need to have blood drawn on Friday, September 2 at [facility] do not need to be fasting.  4. Medication instructions in preparation for your procedure:   Contrast Allergy: No  Do not take your Losartan the morning of  the procedure.  Do not take any insulin on the day of the procedure.  On the morning of your procedure, take your Aspirin and any morning medicines NOT listed above.  You may use sips of water.  5. Plan for one night stay--bring personal belongings. 6. Bring a current list of your medications and current insurance cards. 7.  You MUST have a responsible person to drive you home. 8. Someone MUST be with you the first 24 hours after you arrive home or your discharge will be delayed. 9. Please wear clothes that are easy to get on and off and wear slip-on shoes.  Thank you for allowing [place] to care for you!   -- [facility]

## 2021-06-25 LAB — CBC
Hematocrit: 42.9 % (ref 37.5–51.0)
Hemoglobin: 14.8 g/dL (ref 13.0–17.7)
MCH: 32.1 pg (ref 26.6–33.0)
MCHC: 34.5 g/dL (ref 31.5–35.7)
MCV: 93 fL (ref 79–97)
Platelets: 299 10*3/uL (ref 150–450)
RBC: 4.61 x10E6/uL (ref 4.14–5.80)
RDW: 12.2 % (ref 11.6–15.4)
WBC: 6.7 10*3/uL (ref 3.4–10.8)

## 2021-06-25 LAB — BASIC METABOLIC PANEL
BUN/Creatinine Ratio: 13 (ref 9–20)
BUN: 12 mg/dL (ref 6–24)
CO2: 22 mmol/L (ref 20–29)
Calcium: 9.3 mg/dL (ref 8.7–10.2)
Chloride: 105 mmol/L (ref 96–106)
Creatinine, Ser: 0.94 mg/dL (ref 0.76–1.27)
Glucose: 145 mg/dL — ABNORMAL HIGH (ref 65–99)
Potassium: 3.8 mmol/L (ref 3.5–5.2)
Sodium: 141 mmol/L (ref 134–144)
eGFR: 95 mL/min/{1.73_m2} (ref >59)

## 2021-06-30 ENCOUNTER — Telehealth: Payer: Self-pay | Admitting: *Deleted

## 2021-06-30 NOTE — Telephone Encounter (Signed)
Cardiac catheterization scheduled at [facility] for: Friday July 01, 2021 10:30 AM [facility] Main Entrance A [facility]) at: 8:30 AM   No solid food after midnight prior to cath, clear liquids until 5 AM day of procedure.  Medication instructions: Hold: -Insulin-AM of procedure   Except hold medications morning medications can be taken pre-cath with sips of water including aspirin 81 mg.    Confirmed patient has responsible adult to drive home post procedure and be with patient first 24 hours after arriving home.  Patients are allowed one visitor in the waiting room during the time they are at the hospital for their procedure. Both patient and visitor must wear a mask once they enter the hospital.   Patient reports does not currently have any symptoms concerning for COVID-19 and no household members with COVID-19 like illness.      Reviewed procedure/mask/visitor instructions with patient.

## 2021-07-01 ENCOUNTER — Ambulatory Visit (HOSPITAL_COMMUNITY)
Admission: RE | Admit: 2021-07-01 | Discharge: 2021-07-01 | Disposition: A | Discharge status: Home / Self Care | Payer: 59 | Attending: Cardiovascular Disease | Admitting: Cardiovascular Disease

## 2021-07-01 ENCOUNTER — Ambulatory Visit (HOSPITAL_COMMUNITY)
Admission: RE | Disposition: A | Discharge status: Home / Self Care | Payer: Self-pay | Source: Home / Self Care | Attending: Cardiovascular Disease

## 2021-07-01 ENCOUNTER — Other Ambulatory Visit: Payer: Self-pay

## 2021-07-01 DIAGNOSIS — R0683 Snoring: Secondary | ICD-10-CM | POA: Diagnosis not present

## 2021-07-01 DIAGNOSIS — I2510 Atherosclerotic heart disease of native coronary artery without angina pectoris: Secondary | ICD-10-CM | POA: Diagnosis not present

## 2021-07-01 DIAGNOSIS — Z955 Presence of coronary angioplasty implant and graft: Secondary | ICD-10-CM | POA: Insufficient documentation

## 2021-07-01 DIAGNOSIS — R0609 Other forms of dyspnea: Secondary | ICD-10-CM

## 2021-07-01 DIAGNOSIS — R0600 Dyspnea, unspecified: Secondary | ICD-10-CM | POA: Diagnosis not present

## 2021-07-01 DIAGNOSIS — I10 Essential (primary) hypertension: Secondary | ICD-10-CM | POA: Insufficient documentation

## 2021-07-01 DIAGNOSIS — I25118 Atherosclerotic heart disease of native coronary artery with other forms of angina pectoris: Secondary | ICD-10-CM | POA: Diagnosis not present

## 2021-07-01 DIAGNOSIS — E119 Type 2 diabetes mellitus without complications: Secondary | ICD-10-CM | POA: Insufficient documentation

## 2021-07-01 DIAGNOSIS — K219 Gastro-esophageal reflux disease without esophagitis: Secondary | ICD-10-CM | POA: Insufficient documentation

## 2021-07-01 DIAGNOSIS — E785 Hyperlipidemia, unspecified: Secondary | ICD-10-CM | POA: Insufficient documentation

## 2021-07-01 DIAGNOSIS — R600 Localized edema: Secondary | ICD-10-CM | POA: Insufficient documentation

## 2021-07-01 HISTORY — PX: LEFT HEART CATH AND CORONARY ANGIOGRAPHY: CATH118249

## 2021-07-01 LAB — GLUCOSE, CAPILLARY
Glucose-Capillary: 317 mg/dL — ABNORMAL HIGH (ref 70–99)
Glucose-Capillary: 141 mg/dL — ABNORMAL HIGH (ref 70–99)

## 2021-07-01 SURGERY — LEFT HEART CATH AND CORONARY ANGIOGRAPHY
Anesthesia: LOCAL

## 2021-07-01 MED ORDER — SODIUM CHLORIDE 0.9% FLUSH: 3.0000 mL | Freq: Two times a day (BID) | INTRAVENOUS | Status: DC

## 2021-07-01 MED ORDER — HEPARIN SODIUM (PORCINE) 1000 UNIT/ML IJ SOLN
INTRAMUSCULAR | Status: DC | PRN
  Administered 2021-07-01: 5000 [IU] via INTRAVENOUS

## 2021-07-01 MED ORDER — MIDAZOLAM HCL 2 MG/2ML IJ SOLN
INTRAMUSCULAR | Status: AC
  Filled 2021-07-01: qty 2

## 2021-07-01 MED ORDER — ONDANSETRON HCL 4 MG/2ML IJ SOLN: 4.0000 mg | Freq: Four times a day (QID) | INTRAMUSCULAR | Status: DC | PRN

## 2021-07-01 MED ORDER — SODIUM CHLORIDE 0.9 % WEIGHT BASED INFUSION
3.0000 mL/kg/h | INTRAVENOUS | Status: AC
  Administered 2021-07-01: 3 mL/kg/h via INTRAVENOUS

## 2021-07-01 MED ORDER — LIDOCAINE HCL (PF) 1 % IJ SOLN
INTRAMUSCULAR | Status: AC
  Filled 2021-07-01: qty 30

## 2021-07-01 MED ORDER — VERAPAMIL HCL 2.5 MG/ML IV SOLN
INTRAVENOUS | Status: DC | PRN
  Administered 2021-07-01: 10 mL via INTRA_ARTERIAL

## 2021-07-01 MED ORDER — SODIUM CHLORIDE 0.9 % WEIGHT BASED INFUSION: 1.0000 mL/kg/h | INTRAVENOUS | Status: DC

## 2021-07-01 MED ORDER — HEPARIN (PORCINE) IN NACL 1000-0.9 UT/500ML-% IV SOLN
INTRAVENOUS | Status: AC
  Filled 2021-07-01: qty 500

## 2021-07-01 MED ORDER — SODIUM CHLORIDE 0.9 % IV SOLN: INTRAVENOUS | Status: AC

## 2021-07-01 MED ORDER — SODIUM CHLORIDE 0.9 % IV SOLN: 250.0000 mL | INTRAVENOUS | Status: DC | PRN

## 2021-07-01 MED ORDER — SODIUM CHLORIDE 0.9% FLUSH: 3.0000 mL | INTRAVENOUS | Status: DC | PRN

## 2021-07-01 MED ORDER — ASPIRIN 81 MG PO CHEW: 81.0000 mg | CHEWABLE_TABLET | ORAL | Status: DC

## 2021-07-01 MED ORDER — FENTANYL CITRATE (PF) 100 MCG/2ML IJ SOLN
INTRAMUSCULAR | Status: DC | PRN
  Administered 2021-07-01: 50 ug via INTRAVENOUS

## 2021-07-01 MED ORDER — FENTANYL CITRATE (PF) 100 MCG/2ML IJ SOLN
INTRAMUSCULAR | Status: AC
  Filled 2021-07-01: qty 2

## 2021-07-01 MED ORDER — IOHEXOL 350 MG/ML SOLN
INTRAVENOUS | Status: DC | PRN
  Administered 2021-07-01: 75 mL

## 2021-07-01 MED ORDER — MIDAZOLAM HCL 2 MG/2ML IJ SOLN
INTRAMUSCULAR | Status: DC | PRN
  Administered 2021-07-01: 2 mg via INTRAVENOUS

## 2021-07-01 MED ORDER — HYDRALAZINE HCL 20 MG/ML IJ SOLN: 10.0000 mg | INTRAMUSCULAR | Status: DC | PRN

## 2021-07-01 MED ORDER — ACETAMINOPHEN 325 MG PO TABS: 650.0000 mg | ORAL_TABLET | ORAL | Status: DC | PRN

## 2021-07-01 MED ORDER — VERAPAMIL HCL 2.5 MG/ML IV SOLN
INTRAVENOUS | Status: AC
  Filled 2021-07-01: qty 2

## 2021-07-01 MED ORDER — LABETALOL HCL 5 MG/ML IV SOLN: 10.0000 mg | INTRAVENOUS | Status: DC | PRN

## 2021-07-01 MED ORDER — LIDOCAINE HCL (PF) 1 % IJ SOLN
INTRAMUSCULAR | Status: DC | PRN
  Administered 2021-07-01: 15 mL
  Administered 2021-07-01: 2 mL

## 2021-07-01 MED ORDER — HEPARIN SODIUM (PORCINE) 1000 UNIT/ML IJ SOLN
INTRAMUSCULAR | Status: AC
  Filled 2021-07-01: qty 1

## 2021-07-01 SURGICAL SUPPLY — 16 items
CATH INFINITI 5 FR AL2 (CATHETERS) ×1 IMPLANT
CATH INFINITI 5 FR JL3.5 (CATHETERS) ×1 IMPLANT
CATH INFINITI 5FR AL1 (CATHETERS) ×1 IMPLANT
CATH INFINITI 5FR MULTPACK ANG (CATHETERS) ×1 IMPLANT
CATH LAUNCHER 5F EBU3.5 (CATHETERS) ×1 IMPLANT
CLOSURE MYNX CONTROL 5F (Vascular Products) ×1 IMPLANT
DEVICE RAD COMP TR BAND LRG (VASCULAR PRODUCTS) ×1 IMPLANT
GLIDESHEATH SLEND SS 6F .021 (SHEATH) ×1 IMPLANT
GUIDEWIRE INQWIRE 1.5J.035X260 (WIRE) IMPLANT
INQWIRE 1.5J .035X260CM (WIRE) ×4
KIT HEART LEFT (KITS) ×2 IMPLANT
PACK CARDIAC CATHETERIZATION (CUSTOM PROCEDURE TRAY) ×2 IMPLANT
SHEATH PINNACLE 5F 10CM (SHEATH) ×1 IMPLANT
SHEATH PROBE COVER 6X72 (BAG) ×1 IMPLANT
TRANSDUCER W/STOPCOCK (MISCELLANEOUS) ×2 IMPLANT
TUBING CIL FLEX 10 FLL-RA (TUBING) ×2 IMPLANT

## 2021-07-01 NOTE — Interval H&P Note (Signed)
History and Physical Interval Note:  07/01/2021 8:21 AM  [name]  has presented today for surgery, with the diagnosis of angina - abnormal stress test.  The various methods of treatment have been discussed with the patient and family. After consideration of risks, benefits and other options for treatment, the patient has consented to  Procedure(s): LEFT HEART CATH AND CORONARY ANGIOGRAPHY (N/A) as a surgical intervention.  The patient's history has been reviewed, patient examined, no change in status, stable for surgery.  I have reviewed the patient's chart and labs.  Questions were answered to the patient's satisfaction.    Cath Lab Visit (complete for each Cath Lab visit)  Clinical Evaluation Leading to the Procedure:   ACS: No.  Non-ACS:    Anginal Classification: No Symptoms of angina. He has dyspnea which may be an anginal equivalent  Anti-ischemic medical therapy: Minimal Therapy (1 class of medications)  Non-Invasive Test Results: Intermediate-risk stress test findings: cardiac mortality 1-3%/year  Prior CABG: No previous CABG        Lauree Chandler

## 2021-07-04 ENCOUNTER — Encounter (HOSPITAL_COMMUNITY): Payer: Self-pay | Admitting: Cardiovascular Disease

## 2021-07-04 MED FILL — Heparin Sod (Porcine)-NaCl IV Soln 1000 Unit/500ML-0.9%: INTRAVENOUS | Qty: 1000 | Status: AC

## 2021-07-08 DIAGNOSIS — Z006 Encounter for examination for normal comparison and control in clinical research program: Secondary | ICD-10-CM

## 2021-07-11 NOTE — Research (Signed)
Called patient to discuss Lp(a) trial and unable to reach. Voice message left to return my call at his earliest convenience

## 2021-07-13 ENCOUNTER — Telehealth: Payer: Self-pay

## 2021-07-13 DIAGNOSIS — Z006 Encounter for examination for normal comparison and control in clinical research program: Secondary | ICD-10-CM

## 2021-07-13 NOTE — Telephone Encounter (Signed)
Open in error

## 2021-07-13 NOTE — Research (Signed)
Spoke to patient about [facility] Lpa trial and appointment made for 07/15/21

## 2021-07-15 ENCOUNTER — Encounter (HOSPITAL_BASED_OUTPATIENT_CLINIC_OR_DEPARTMENT_OTHER): Payer: Self-pay | Admitting: Family

## 2021-07-15 ENCOUNTER — Other Ambulatory Visit: Payer: Self-pay

## 2021-07-15 ENCOUNTER — Ambulatory Visit (INDEPENDENT_AMBULATORY_CARE_PROVIDER_SITE_OTHER): Payer: 59 | Admitting: Family

## 2021-07-15 VITALS — BP 120/74 | HR 88 | Ht 71.0 in | Wt 249.9 lb

## 2021-07-15 DIAGNOSIS — I25118 Atherosclerotic heart disease of native coronary artery with other forms of angina pectoris: Secondary | ICD-10-CM

## 2021-07-15 DIAGNOSIS — G4730 Sleep apnea, unspecified: Secondary | ICD-10-CM

## 2021-07-15 DIAGNOSIS — R0683 Snoring: Secondary | ICD-10-CM

## 2021-07-15 DIAGNOSIS — R0609 Other forms of dyspnea: Secondary | ICD-10-CM

## 2021-07-15 DIAGNOSIS — Z006 Encounter for examination for normal comparison and control in clinical research program: Secondary | ICD-10-CM

## 2021-07-15 DIAGNOSIS — R0600 Dyspnea, unspecified: Secondary | ICD-10-CM

## 2021-07-15 DIAGNOSIS — E785 Hyperlipidemia, unspecified: Secondary | ICD-10-CM | POA: Diagnosis not present

## 2021-07-15 DIAGNOSIS — G478 Other sleep disorders: Secondary | ICD-10-CM

## 2021-07-15 NOTE — Progress Notes (Signed)
Office Visit    Patient Name: [name] Date of Encounter: 07/15/2021  PCP:  Maris Berger, [place]  Cardiologist:  Minus Breeding, MD  Advanced Practice Provider:  No care team member to display Electrophysiologist:  None   Chief Complaint    [name] is a 55 y.o. male with a hx of CAD, HLD, GERD, DM2, HLD presents today for follow up after cardiac testing  Past Medical History    Past Medical History:  Diagnosis Date   Coronary artery disease    s/p NSTEMI after ETT-echo - [facility] 7/13:  LM < 10%, pLAD 90%, mLAD 95%, pCFX tandem 90%, mCFX 80-90%, pRCA 30%, mRCA 40-50%;  PCI: Xience DES x 2 to prox and mid LAD; Xience DES x 1 to prox and mid CFX   Family history of anesthesia complication    nausea    GERD (gastroesophageal reflux disease)    HLD (hyperlipidemia)    Hypertension    Lung nodule 2013   CT 7/13: 3 mm RML nodule - felt to be stable and benign/ follow up in 2014   Myocardial infarction [facility]) 2013   have 4 stents   S/P coronary artery stent placement    Type I diabetes mellitus (HCC)    insulin dependent   Past Surgical History:  Procedure Laterality Date   25 GAUGE PARS PLANA VITRECTOMY WITH 20 GAUGE MVR PORT FOR MACULAR HOLE Right 08/01/2016   25 GAUGE PARS PLANA VITRECTOMY WITH 20 GAUGE MVR PORT FOR MACULAR HOLE, MEMBRANE PEEL AND SERUM PATCH WITH ENDOLASER AND C3F8    25 GAUGE PARS PLANA VITRECTOMY WITH 20 GAUGE MVR PORT FOR MACULAR HOLE Right 08/01/2016   Procedure: 25 GAUGE PARS PLANA VITRECTOMY WITH 20 GAUGE MVR PORT FOR MACULAR HOLE, MEMBRANE PEEL AND SERUM PATCH WITH ENDOLASER AND C3F8;  Surgeon: Hayden Pedro, MD;  Location: [place];  Service: Ophthalmology;  Laterality: Right;   APPENDECTOMY  1980's   CATARACT EXTRACTION  07/2017   right eye   CHOLECYSTECTOMY  ~ 2006   COLONOSCOPY     CORONARY ANGIOPLASTY WITH STENT PLACEMENT  04/2013   LAD   GAS/FLUID EXCHANGE Right 09/16/2013   Procedure: AIR/FLUID  EXCHANGE;  Surgeon: Hayden Pedro, MD;  Location: [place];  Service: Ophthalmology;  Laterality: Right;   GAS/FLUID EXCHANGE Right 08/01/2016   Procedure: GAS/FLUID EXCHANGE;  Surgeon: Hayden Pedro, MD;  Location: [place];  Service: Ophthalmology;  Laterality: Right;   KNEE ARTHROSCOPY Right ~ 1984   LEFT HEART CATH AND CORONARY ANGIOGRAPHY N/A 10/14/2019   Procedure: LEFT HEART CATH AND CORONARY ANGIOGRAPHY;  Surgeon: Martinique, Peter M, MD;  Location: [place] [facility];  Service: Cardiovascular;  Laterality: N/A;   LEFT HEART CATH AND CORONARY ANGIOGRAPHY N/A 07/01/2021   Procedure: LEFT HEART CATH AND CORONARY ANGIOGRAPHY;  Surgeon: Burnell Blanks, MD;  Location: [place] [facility];  Service: Cardiovascular;  Laterality: N/A;   LEFT HEART CATHETERIZATION WITH CORONARY ANGIOGRAM N/A 05/03/2012   Procedure: LEFT HEART CATHETERIZATION WITH CORONARY ANGIOGRAM;  Surgeon: Peter M Martinique, MD;  Location: [facility] CATH LAB;  Service: Cardiovascular;  Laterality: N/A;   MEMBRANE PEEL Right 09/16/2013   Procedure: MEMBRANE PEEL;  Surgeon: Hayden Pedro, MD;  Location: [place];  Service: Ophthalmology;  Laterality: Right;   PARS PLANA VITRECTOMY Right 09/16/2013   w/membrane peel/notes 11/25/20145   PARS PLANA VITRECTOMY Right 09/16/2013   Procedure: PARS PLANA VITRECTOMY  WITH 25 GAUGE;  Surgeon: Hayden Pedro, MD;  Location: [place];  Service: Ophthalmology;  Laterality: Right;   PHOTOCOAGULATION WITH LASER Right 09/16/2013   Procedure: PHOTOCOAGULATION WITH LASER;  Surgeon: Hayden Pedro, MD;  Location: [place];  Service: Ophthalmology;  Laterality: Right;  ENDOLASER   POLYPECTOMY     SERUM PATCH Right 08/01/2016   Procedure: SERUM PATCH;  Surgeon: Hayden Pedro, MD;  Location: [place];  Service: Ophthalmology;  Laterality: Right;   TONSILLECTOMY AND ADENOIDECTOMY  1970's    Allergies  No Known Allergies  History of Present Illness    [name] is a 55 y.o. male with a hx of CAD s/p  multivessel PCI, HLD, HTN, GERD, DM2 last seen 05/16/21.  He has a history of multivessel PCI. Most recent cardiac cath 09/2019 with single vessel occlusive CAD with stent in prox and mid LAD patent and occluded second OM at site of prior stent with som left-left collaterals and segmental 50% prox to mid RCA stenosis which was unchanged from 2013. He was recommended for medical management.  He was seen 10/2020 by Dr. Lolly Mustache doing overall well. Noted he was more sedentary than previous. Seen by Dr. Debara Pickett 02/2021 and started on Praluent.   Seen 05/16/21 noting dyspnea on exertion. This was in setting of pneumonia in both 07/2020 and 01/2021 treated with antibiotics and steroids.  Imdur started and testing ordered. Subsequent echocardiogram LVEF 55-60%, mild concentric LVH, normal diastolic function, RV normal size and function, normal PASP, no significant valvular disease. Lexiscan 05/26/21 with medium defect of moderate severity with findings consistent with prior MI with peri infarct ischemia deemed intermediate risk study. His Imdur was up-titrated after discussion with Dr. Harrell Gave as his symptoms were improving. Seen 06/22/2021 and due to persistent dyspnea on exertion that was improving he was recommended for cardiac catheterization.  Cardiac catheterization 07/01/2021 showed patent proximal and mid LAD stents with minimal restenosis.  Known occlusion of previously stented obtuse marginal branch.  There was no change from previous cardiac catheterization.  Presents today for follow-up with his wife. Reports no shortness of breath and stable dyspnea on exertion. Reports no chest pain, pressure, or tightness. No edema, orthopnea, PND. Reports no palpitations.  Wife shares with me that Sturday he walked around the townhome and felt fatigue  EKGs/Labs/Other Studies Reviewed:   The following studies were reviewed today:  Cardiac catheterization 07/01/2021 Patent proximal and mid LAD stents with minimal  restenosis.  Known occlusion of the previously stented second obtuse marginal branch The RCA is a large dominant artery with moderate, calcified mid stenosis. No change from last cath. No obstructive lesions are seen.   Recommendations: No change in coronary anatomy from last cath. Continue medical management of CAD.   Myoview  05/26/21 Nuclear stress EF: 45%. The left ventricular ejection fraction is mildly decreased (45-54%). There was no ST segment deviation noted during stress. TID is 1.26, cannot exclude balanced ischemia Defect 1: There is a medium defect of moderate severity present in the mid inferior, mid inferolateral and apical lateral location. Findings consistent with prior myocardial infarction with peri-infarct ischemia. This is an intermediate risk study.   Intermediate risk study. There is an area in the mid to distal inferolateral wall that worsens with stress, consistent with infarct with peri-infarct ischemia. There is also mild wall motion abnormalities seen in this area with overall preserved EF Echo 05/17/21   1. Left ventricular ejection fraction, by estimation, is 55 to 60%. The  left ventricle has normal function. The left ventricle has no regional  wall motion abnormalities. There is mild concentric left ventricular  hypertrophy. Left ventricular diastolic  parameters were normal.   2. Right ventricular systolic function is normal. The right ventricular  size is normal. There is normal pulmonary artery systolic pressure.   3. The mitral valve is normal in structure. No evidence of mitral valve  regurgitation. No evidence of mitral stenosis.   4. The aortic valve is tricuspid. Aortic valve regurgitation is not  visualized. No aortic stenosis is present.   5. The inferior vena cava is normal in size with greater than 50%  respiratory variability, suggesting right atrial pressure of 3 mmHg.   FINDINGS   Left Ventricle: Left ventricular ejection fraction, by  estimation, is 55  to 60%. The left ventricle has normal function. The left ventricle has no  regional wall motion abnormalities. The left ventricular internal cavity  size was normal in size. There is   mild concentric left ventricular hypertrophy. Left ventricular diastolic  parameters were normal. Normal left ventricular filling pressure.   Right Ventricle: The right ventricular size is normal. No increase in  right ventricular wall thickness. Right ventricular systolic function is  normal. There is normal pulmonary artery systolic pressure. The tricuspid  regurgitant velocity is 2.31 m/s, and   with an assumed right atrial pressure of 3 mmHg, the estimated right  ventricular systolic pressure is 44.8 mmHg.   Cardiac cath  10/14/19 Diagnostic Dominance: Co-dominant       EKG:  No EKG is ordered today.  The ekg independently reviewed from 06/22/21  NSR 88 bpm with PVC. Stable TWI inferolateral leads. No change compared to previous.   Recent Labs: 05/16/2021: ALT 42 05/18/2021: NT-Pro BNP 30 06/24/2021: BUN 12; Creatinine, Ser 0.94; Hemoglobin 14.8; Platelets 299; Potassium 3.8; Sodium 141  Recent Lipid Panel    Component Value Date/Time   CHOL 79 (L) 05/18/2021 1700   TRIG 142 05/18/2021 1700   HDL 46 05/18/2021 1700   CHOLHDL 1.7 05/18/2021 1700   CHOLHDL 2.5 07/10/2016 0919   VLDL 9 07/10/2016 0919   LDLCALC 9 05/18/2021 1700   LDLDIRECT 156 (H) 11/15/2020 1006    Home Medications   Current Meds  Medication Sig   ACCU-CHEK AVIVA PLUS test strip 1 each by Other route as needed.    ADVAIR DISKUS 250-50 MCG/DOSE AEPB Inhale 1 puff into the lungs daily.    albuterol (PROVENTIL HFA;VENTOLIN HFA) 108 (90 Base) MCG/ACT inhaler Inhale 1 puff into the lungs every 6 (six) hours as needed for wheezing or shortness of breath.   Alirocumab (PRALUENT) 75 MG/ML SOAJ Inject 1 Dose into the skin every 14 (fourteen) days.   aspirin 81 MG tablet Take 81 mg by mouth daily.   azelastine  (ASTELIN) 0.1 % nasal spray Place 1 spray into the nose daily.    Continuous Blood Gluc Sensor (DEXCOM G6 SENSOR) MISC    insulin aspart (NOVOLOG FLEXPEN) 100 UNIT/ML FlexPen To be inject 3-4 times daily for meals or snacks at directed, up to 100 units daily.   insulin degludec (TRESIBA) 100 UNIT/ML FlexTouch Pen Inject 38 Units into the skin every morning.    isosorbide mononitrate (IMDUR) 60 MG 24 hr tablet Take 1 tablet (60 mg total) by mouth daily.   losartan (COZAAR) 25 MG tablet TAKE 1 TABLET(25 MG) BY MOUTH DAILY   Multiple Vitamins-Minerals (AIRBORNE GUMMIES) CHEW Chew 3 capsules by mouth daily.   nitroGLYCERIN (NITROSTAT)  0.4 MG SL tablet PLACE 1 TABLET UNDER THE TONGUE EVERY 5 (FIVE) MINUTES AS NEEDED FOR CHEST PAIN AS DIRECTED   omeprazole (PRILOSEC) 20 MG capsule Take 20 mg by mouth daily.   rosuvastatin (CRESTOR) 5 MG tablet Take 1 tablet (5 mg total) by mouth daily at 6 PM.   Semaglutide, 1 MG/DOSE, (OZEMPIC, 1 MG/DOSE,) 2 MG/1.5ML SOPN Inject 1 mg into the skin every Monday.    [DISCONTINUED] insulin lispro (HUMALOG) 100 UNIT/ML injection Inject 1-20 Units into the skin 3 (three) times daily before meals. 1 unit per 4 carbs     Review of Systems     All other systems reviewed and are otherwise negative except as noted above.  Physical Exam    VS:  BP 120/74   Pulse 88   Ht 5\' 11"  (1.803 m)   Wt 249 lb 14.4 oz (113.4 kg)   SpO2 96%   BMI 34.85 kg/m  , BMI Body mass index is 34.85 kg/m.  Wt Readings from Last 3 Encounters:  07/15/21 249 lb 14.4 oz (113.4 kg)  07/01/21 240 lb (108.9 kg)  06/22/21 248 lb (112.5 kg)    GEN: Well nourished, well developed, in no acute distress. HEENT: normal. Neck: Supple, no JVD, carotid bruits, or masses. Cardiac: RRR, no murmurs, rubs, or gallops. No clubbing, cyanosis. Trace pedal edema..  Radials/PT 2+ and equal bilaterally.  Respiratory:  Respirations regular and unlabored, clear to auscultation bilaterally. GI: Soft, nontender,  nondistended. MS: No deformity or atrophy. Skin: Warm and dry, no rash. L radial cath site with no ecchymosis.  Neuro:  Strength and sensation are intact. Psych: Normal affect.  Assessment & Plan    CAD / DOE - [place] 07/01/21 with stable anatomy. GDMT includes Aspirin, Imdur, Rosuvastatin.Given persistent dyspnea, will refer to pulmonology  given 2 episodes of pneumonia and residual DOE over the last 6 months.   LE edema - Recent echo with normal LVEF. No edema appreciated on exam today. Likely etiology dependent edema and venous insufficiency. Elevation, compression socks, low sodium diet encouraged.  Snores / Daytime fatigue - Never with sleep study. Endorses snoring, nonrestorative sleep, daytime fatigue. Risk factors for OSA include weight, hypertension. Itamar sleep study ordered. STOP Bang score of 7.   HLD, LDL goal <70 - Follows with Dr. Debara Pickett of the lipid clinic. 11/15/20 LDL 156. Recently started on Praluent. Tolerating well. Follow up as scheduled with Dr. Debara Pickett.  HTN -BP well controlled. Continue current antihypertensive regimen.   DM2 - 09/2020 A1c 6.6. 06/14/21 A1c 7.3. Continue to follow with PCP and endocrinology.  Disposition: Follow in 5 months with Dr. [name]  Signed, [name], NP 07/15/2021, 8:38 AM [place]

## 2021-07-15 NOTE — Research (Signed)
Subject # [id number] [employer] Lp(a) [id number] Site # [phone]  SEX [x]    Male                      []    Male  Ethnicity []   Hispanic or Latino   [x]   Not Hispanic or Latino  Race [x]   White                 []   Black or African American  []   Asian []   American [place] or [place] Native            []   Native Hawaiian or Other Pacific Islander                      []   Other  Other   Age 55  Subject Group [x]    Local Lab           []   Historical Lp(a) value                                       Results:  Future research [x]    Yes                    []   No    Amgen [id number] Site # [id number] Subject ID # [id number]         ELIGIBILITY CRITERIA WORKSHEET INCLUSION CRITERIA   Subject has provided informed consent prior to the initiation of any study specific activities/procedures [x]   Age 57 to 95 years [x]   MI (presumed type 1) OR [x]   PCI (with high-risk features) with at least 1 of the following: []   Age >31 []   Diabetes mellitus  HbA1c: []   History of ischemic stroke []   History of peripheral arterial disease []   Residual stenosis ? 50% []   Multivessel PCI (ie, ? 2 vessels, including branch arteries []   EXCLUSIONS THE FOLLOWING N/A [x]   Subjects known to be currently receiving investigational drug in a clinical study that is anticipated to last > 1 year []   Known Lp(a) value 90mg /dL or < 200nmol/L []   Subject has a diagnosis of end-stage renal disease or requires dialysis. []   Poorly controlled (glycated hemoglobin [HbA1c] > 10%) diabetes mellitus (type 1 or type 2) []   Subject is receiving or has received lipoprotein apheresis to reduce Lp(a) within 3 months prior to enrollment. []   Known uncontrolled or recurrent ventricular tachycardia in the past 3 months prior to enrollment. []   Known malignancy (except non-melanoma skin cancers, cervical in situ carcinoma, breast ductal carcinoma in situ, or stage 1 prostate carcinoma) within the last 5 years prior to enrollment. []   Known  history or evidence of clinically significant disease (eg, respiratory, gastrointestinal, or psychiatric disease) or unstable disorder or biomarker that, in the opinion of the investigator(s), would result in life expectancy < 5 years. []   Known hemorrhagic stroke. []    [employer] Lp(a) Informed Consent   Subject Name: [name]  Subject met inclusion and exclusion criteria.  The informed consent form, study requirements and expectations were reviewed with the subject and questions and concerns were addressed prior to the signing of the consent form.  The subject verbalized understanding of the trial requirements.  The subject agreed to participate in the [facility] Lp(a) trial and signed the informed consent at 0955 on 23/SEP/2022.  The informed consent was obtained prior to  performance of any protocol-specific procedures for the subject.  A copy of the signed informed consent was given to the subject and a copy was placed in the subject's medical record.   Chanda Busing  [employer] Consent Version 2 Protocol Version 2

## 2021-07-15 NOTE — Patient Instructions (Signed)
Medication Instructions:  Continue your current medications.   *If you need a refill on your cardiac medications before your next appointment, please call your pharmacy*  Lab Work: None ordered today.   Testing/Procedures: WatchPAT?  Is a FDA cleared portable home sleep study test that uses a watch and 3 points of contact to monitor 7 different channels, including your heart rate, oxygen saturations, body position, snoring, and chest motion.  The study is easy to use from the comfort of your own home and accurately detect sleep apnea.  Before bed, you attach the chest sensor, attached the sleep apnea bracelet to your nondominant hand, and attach the finger probe.  After the study, the raw data is downloaded from the watch and scored for apnea events.   For more information: [email or web address]   Follow-Up: At [facility], you and your health needs are our priority.  As part of our continuing mission to provide you with exceptional heart care, we have created designated Provider Care Teams.  These Care Teams include your primary Cardiologist (physician) and Advanced Practice Providers (APPs -  Physician Assistants and Nurse Practitioners) who all work together to provide you with the care you need, when you need it.  We recommend signing up for the patient portal called "MyChart".  Sign up information is provided on this After Visit Summary.  MyChart is used to connect with patients for Virtual Visits (Telemedicine).  Patients are able to view lab/test results, encounter notes, upcoming appointments, etc.  Non-urgent messages can be sent to your provider as well.   To learn more about what you can do with MyChart, go to [email or web address].    Your next appointment:   11/2021 with Dr. [name]   The format for your next appointment:   In Person  Other Instructions

## 2021-07-16 LAB — LIPOPROTEIN A (LPA): Lipoprotein (a): 13.1 nmol/L

## 2021-07-19 ENCOUNTER — Telehealth (HOSPITAL_BASED_OUTPATIENT_CLINIC_OR_DEPARTMENT_OTHER): Payer: Self-pay | Admitting: Family

## 2021-07-19 ENCOUNTER — Telehealth: Payer: Self-pay | Admitting: *Deleted

## 2021-07-19 NOTE — Telephone Encounter (Signed)
Called pt and gave code--1234. Pt verbalized thanks and stated he will get it done. Reminded pt instructions are in the APP available to download and instructions should also be in the box. Pt verbalized understanding.

## 2021-07-19 NOTE — Telephone Encounter (Signed)
-----   Message from [name], NP sent at 07/15/2021  8:54 AM EDT ----- Regarding: [name] Please pre-cert for [name] sleep study. TY!

## 2021-07-19 NOTE — Telephone Encounter (Signed)
Per [employer] automated machine no PA is required for itamar codes 95800 and [id number]. Call reference # for [id number] is [id number]. Call reference # for code [phone] is [id number]. Message sent to Laurann Montana ok to have patient to activate device.

## 2021-07-19 NOTE — Telephone Encounter (Signed)
-----   Message from [name], NP sent at 07/19/2021  2:23 PM EDT ----- Regarding: FW: itamar Will you call Mr. Steffanie Dunn with code for Jaynie Crumble please? Thank you! ----- Message ----- From: Lauralee Evener, CMA Sent: 07/19/2021   2:22 PM EDT To: [name], NP Subject: RE: Johnston Ebbs for patient to activate device. No PA is required. ----- Message ----- From: [name], NP Sent: 07/15/2021   8:54 AM EDT To: Lauralee Evener, CMA Subject: itamar                                         Please pre-cert for Itamar sleep study. TY!

## 2021-07-23 ENCOUNTER — Encounter (INDEPENDENT_AMBULATORY_CARE_PROVIDER_SITE_OTHER): Payer: 59 | Admitting: Cardiology

## 2021-07-23 DIAGNOSIS — G4733 Obstructive sleep apnea (adult) (pediatric): Secondary | ICD-10-CM | POA: Diagnosis not present

## 2021-07-25 ENCOUNTER — Ambulatory Visit: Payer: 59

## 2021-07-25 DIAGNOSIS — R0683 Snoring: Secondary | ICD-10-CM

## 2021-07-25 DIAGNOSIS — G4730 Sleep apnea, unspecified: Secondary | ICD-10-CM

## 2021-07-25 DIAGNOSIS — I25118 Atherosclerotic heart disease of native coronary artery with other forms of angina pectoris: Secondary | ICD-10-CM

## 2021-07-25 DIAGNOSIS — R0609 Other forms of dyspnea: Secondary | ICD-10-CM

## 2021-07-25 DIAGNOSIS — G478 Other sleep disorders: Secondary | ICD-10-CM

## 2021-07-25 NOTE — Procedures (Signed)
   Sleep Study Report Patient Information Study Date: 07/23/21 Patient Name: [name] Patient ID: [id number] Birth Date: 2066/06/03 Age: 55 Gender: Male BMI: 33.3 (W=251 lb, H=6' 1'') Referring Physician: Laurann Montana, NP  TEST DESCRIPTION: Home sleep apnea testing was completed using the WatchPat, a Type 1 device, utilizing  peripheral arterial tonometry (PAT), chest movement, actigraphy, pulse oximetry, pulse rate, body position and snore.  AHI was calculated with apnea and hypopnea using valid sleep time as the denominator. RDI includes apneas,  hypopneas, and RERAs. The data acquired and the scoring of sleep and all associated events were performed in  accordance with the recommended standards and specifications as outlined in the AASM Manual for the Scoring of  Sleep and Associated Events 2.2.0 (2015).   FINDINGS:  1. Mild Obstructive Sleep Apnea with AHI 13.5/hr.  2. No Central Sleep Apnea with pAHIc 1/hr.  3. Oxygen desaturations as low as 86%.  4. Mild snoring was present. O2 sats were < 88% for 0.4 min.  5. Total sleep time was 6 hrs and 0 min.  6. 14.3% of total sleep time was spent in REM sleep.  7. Normal sleep onset latency at 16 min.  8. Prolonged REM sleep onset latency at 182 min.  9. Total awakenings were 15.   DIAGNOSIS: Mild Obstructive Sleep Apnea (G47.33)  RECOMMENDATIONS: 1. Clinical correlation of these findings is necessary. The decision to treat obstructive sleep apnea (OSA) is usually  based on the presence of apnea symptoms or the presence of associated medical conditions such as Hypertension,  Congestive Heart Failure, Atrial Fibrillation or Obesity. The most common symptoms of OSA are snoring, gasping for  breath while sleeping, daytime sleepiness and fatigue.   2. Initiating apnea therapy is recommended given the presence of symptoms and/or associated conditions.  Recommend proceeding with one of the following:   a. Auto-CPAP therapy with a  pressure range of 5-20cm H2O.   b. An oral appliance (OA) that can be obtained from certain dentists with expertise in sleep medicine. These are  primarily of use in non-obese patients with mild and moderate disease.   c. An ENT consultation which may be useful to look for specific causes of obstruction and possible treatment  options.  d. If patient is intolerant to PAP therapy, consider referral to ENT for evaluation for hypoglossal nerve stimulator.   3. Close follow-up is necessary to ensure success with CPAP or oral appliance therapy for maximum benefit .  4. A follow-up oximetry study on CPAP is recommended to assess the adequacy of therapy and determine the need  for supplemental oxygen or the potential need for Bi-level therapy. An arterial blood gas to determine the adequacy of  baseline ventilation and oxygenation should also be considered.  5. Healthy sleep recommendations include: adequate nightly sleep (normal 7-9 hrs/night), avoidance of caffeine after  noon and alcohol near bedtime, and maintaining a sleep environment that is cool, dark and quiet.  6. Weight loss for overweight patients is recommended. Even modest amounts of weight loss can significantly  improve the severity of sleep apnea.  7. Snoring recommendations include: weight loss where appropriate, side sleeping, and avoidance of alcohol before  bed.  8. Operation of motor vehicle should be avoided when sleepy.  Signature: Electronically Signed: 07/25/21 Fransico Him, MD; [facility]; [place], American Board of  Sleep Medicine Report prepared by: Fransico Him

## 2021-07-26 ENCOUNTER — Telehealth: Payer: Self-pay | Admitting: *Deleted

## 2021-07-26 DIAGNOSIS — G4733 Obstructive sleep apnea (adult) (pediatric): Secondary | ICD-10-CM

## 2021-07-26 NOTE — Telephone Encounter (Signed)
-----   Message from Sueanne Margarita, MD sent at 07/25/2021  1:13 PM EDT ----- Please let patient know that they have sleep apnea and recommend treating with CPAP.  Please order an auto CPAP from 4-15cm H2O with heated humidity and mask of choice.  Order overnight pulse ox on CPAP.  Followup with me in 6 weeks.

## 2021-07-26 NOTE — Telephone Encounter (Signed)
The patient has been notified of the result and verbalized understanding.  All questions (if any) were answered. Marolyn Hammock, [place] 07/26/2021 3:10 PM    Patient would like to ask if he qualifies for the inspire device or the oral appliance.

## 2021-07-29 NOTE — Telephone Encounter (Signed)
Pt is aware of recommended treatment and is in agreement.  Upon patient request DME selection is [facility] Patient understands he will be contacted by [facility] to set up his cpap. Patient understands to call if [facility] does not contact him with new setup in a timely manner. Patient understands they will be called once confirmation has been received from [facility] that they have received their new machine to schedule 10 week follow up appointment.   [facility] notified of new cpap order  Please add to airview Patient was grateful for the call and thanked me

## 2021-08-19 ENCOUNTER — Ambulatory Visit (INDEPENDENT_AMBULATORY_CARE_PROVIDER_SITE_OTHER): Payer: 59 | Admitting: Pulmonary Disease

## 2021-08-19 ENCOUNTER — Encounter: Payer: Self-pay | Admitting: Pulmonary Disease

## 2021-08-19 ENCOUNTER — Other Ambulatory Visit: Payer: Self-pay

## 2021-08-19 ENCOUNTER — Ambulatory Visit (INDEPENDENT_AMBULATORY_CARE_PROVIDER_SITE_OTHER): Payer: 59

## 2021-08-19 VITALS — BP 124/62 | HR 63 | Temp 97.7°F | Ht 71.0 in | Wt 227.4 lb

## 2021-08-19 DIAGNOSIS — R0602 Shortness of breath: Secondary | ICD-10-CM

## 2021-08-19 NOTE — Patient Instructions (Addendum)
.  Follow-up in 6 to 8 weeks  .  Schedule for chest x-ray today -We will call you to let you know what the x-ray shows  .  Schedule for PFT to be done on the day of next visit  .  Graded regular exercises  .  Call to ensure that CPAP process is already started  .  Give the CPAP a good effort to try and get used to it  .  Weight loss is very important to limit progression of sleep apnea   Call with significant concerns  Sleep Apnea Sleep apnea affects breathing during sleep. It causes breathing to stop for 10 seconds or more, or to become shallow. People with sleep apnea usually snore loudly. It can also increase the risk of: Heart attack. Stroke. Being very overweight (obese). Diabetes. Heart failure. Irregular heartbeat. High blood pressure. The goal of treatment is to help you breathe normally again. What are the causes? The most common cause of this condition is a collapsed or blocked airway. There are three kinds of sleep apnea: Obstructive sleep apnea. This is caused by a blocked or collapsed airway. Central sleep apnea. This happens when the brain does not send the right signals to the muscles that control breathing. Mixed sleep apnea. This is a combination of obstructive and central sleep apnea. What increases the risk? Being overweight. Smoking. Having a small airway. Being older. Being male. Drinking alcohol. Taking medicines to calm yourself (sedatives or tranquilizers). Having family members with the condition. Having a tongue or tonsils that are larger than normal. What are the signs or symptoms? Trouble staying asleep. Loud snoring. Headaches in the morning. Waking up gasping. Dry mouth or sore throat in the morning. Being sleepy or tired during the day. If you are sleepy or tired during the day, you may also: Not be able to focus your mind (concentrate). Forget things. Get angry a lot and have mood swings. Feel sad (depressed). Have changes in  your personality. Have less interest in sex, if you are male. Be unable to have an erection, if you are male. How is this treated?  Sleeping on your side. Using a medicine to get rid of mucus in your nose (decongestant). Avoiding the use of alcohol, medicines to help you relax, or certain pain medicines (narcotics). Losing weight, if needed. Changing your diet. Quitting smoking. Using a machine to open your airway while you sleep, such as: An oral appliance. This is a mouthpiece that shifts your lower jaw forward. A CPAP device. This device blows air through a mask when you breathe out (exhale). An EPAP device. This has valves that you put in each nostril. A BPAP device. This device blows air through a mask when you breathe in (inhale) and breathe out. Having surgery if other treatments do not work. Follow these instructions at home: Lifestyle Make changes that your doctor recommends. Eat a healthy diet. Lose weight if needed. Avoid alcohol, medicines to help you relax, and some pain medicines. Do not smoke or use any products that contain nicotine or tobacco. If you need help quitting, ask your doctor. General instructions Take over-the-counter and prescription medicines only as told by your doctor. If you were given a machine to use while you sleep, use it only as told by your doctor. If you are having surgery, make sure to tell your doctor you have sleep apnea. You may need to bring your device with you. Keep all follow-up visits. Contact a doctor if: The  machine that you were given to use during sleep bothers you or does not seem to be working. You do not get better. You get worse. Get help right away if: Your chest hurts. You have trouble breathing in enough air. You have an uncomfortable feeling in your back, arms, or stomach. You have trouble talking. One side of your body feels weak. A part of your face is hanging down. These symptoms may be an emergency. Get help  right away. Call your local emergency services (911 in the [place] Do not wait to see if the symptoms will go away. Do not drive yourself to the hospital. Summary This condition affects breathing during sleep. The most common cause is a collapsed or blocked airway. The goal of treatment is to help you breathe normally while you sleep. This information is not intended to replace advice given to you by your health care provider. Make sure you discuss any questions you have with your health care provider. Document Revised: 09/17/2020 Document Reviewed: 09/17/2020 Elsevier Patient Education  2022 [employer].

## 2021-08-19 NOTE — Progress Notes (Signed)
[name]    [id number]    07/16/1966  Primary Care Physician:Whyte, Gildardo Griffes, MD  Referring Physician: [name], NP [place],  [place] [address]  Chief complaint:   Shortness of breath on exertion  HPI:  Has had significant health problems in the last year and a half mostly from October 2021  Symptoms started with worsening shortness of breath Concern for COVID but tested negative Shortness of breath with activity -Was as bad as less than 20 m of walking contributing to shortness of breath -Has had bouts with bronchitis, pneumonias in the past  Work-up started with cardiology and all this testing so far have been negative  There was concern for obstructive sleep apnea, had a home sleep study performed which showed mild obstructive sleep apnea  Sugar was uncontrolled but was started on insulin pump recently which coincided with onset of improvement in symptoms  Never smoker No pertinent occupational history  Usually goes to bed between 11 and 12 Falls asleep soon after Few awakenings Final wake up time about 6 AM  Admits to snoring, daytime sleepiness  Overall breathing is getting better Cough is better   Outpatient Encounter Medications as of 08/19/2021  Medication Sig   ACCU-CHEK AVIVA PLUS test strip 1 each by Other route as needed.    ADVAIR DISKUS 250-50 MCG/DOSE AEPB Inhale 1 puff into the lungs daily.    albuterol (PROVENTIL HFA;VENTOLIN HFA) 108 (90 Base) MCG/ACT inhaler Inhale 1 puff into the lungs every 6 (six) hours as needed for wheezing or shortness of breath.   Alirocumab (PRALUENT) 75 MG/ML SOAJ Inject 1 Dose into the skin every 14 (fourteen) days.   aspirin 81 MG tablet Take 81 mg by mouth daily.   azelastine (ASTELIN) 0.1 % nasal spray Place 1 spray into the nose daily.    Continuous Blood Gluc Sensor (DEXCOM G6 SENSOR) MISC    insulin aspart (NOVOLOG FLEXPEN) 100 UNIT/ML FlexPen To be inject 3-4  times daily for meals or snacks at directed, up to 100 units daily.   insulin degludec (TRESIBA) 100 UNIT/ML FlexTouch Pen Inject 38 Units into the skin every morning.    isosorbide mononitrate (IMDUR) 60 MG 24 hr tablet Take 1 tablet (60 mg total) by mouth daily.   losartan (COZAAR) 25 MG tablet TAKE 1 TABLET(25 MG) BY MOUTH DAILY   Multiple Vitamins-Minerals (AIRBORNE GUMMIES) CHEW Chew 3 capsules by mouth daily.   nitroGLYCERIN (NITROSTAT) 0.4 MG SL tablet PLACE 1 TABLET UNDER THE TONGUE EVERY 5 (FIVE) MINUTES AS NEEDED FOR CHEST PAIN AS DIRECTED   omeprazole (PRILOSEC) 20 MG capsule Take 20 mg by mouth daily.   rosuvastatin (CRESTOR) 5 MG tablet Take 1 tablet (5 mg total) by mouth daily at 6 PM.   Semaglutide, 1 MG/DOSE, (OZEMPIC, 1 MG/DOSE,) 2 MG/1.5ML SOPN Inject 1 mg into the skin every Monday.    No facility-administered encounter medications on file as of 08/19/2021.    Allergies as of 08/19/2021   (No Known Allergies)    Past Medical History:  Diagnosis Date   Coronary artery disease    s/p NSTEMI after ETT-echo - [facility] 7/13:  LM < 10%, pLAD 90%, mLAD 95%, pCFX tandem 90%, mCFX 80-90%, pRCA 30%, mRCA 40-50%;  PCI: Xience DES x 2 to prox and mid LAD; Xience DES x 1 to prox and mid CFX   Family history of anesthesia complication    nausea  GERD (gastroesophageal reflux disease)    HLD (hyperlipidemia)    Hypertension    Lung nodule 2013   CT 7/13: 3 mm RML nodule - felt to be stable and benign/ follow up in 2014   Myocardial infarction [facility]) 2013   have 4 stents   S/P coronary artery stent placement    Type I diabetes mellitus (HCC)    insulin dependent    Past Surgical History:  Procedure Laterality Date   25 GAUGE PARS PLANA VITRECTOMY WITH 20 GAUGE MVR PORT FOR MACULAR HOLE Right 08/01/2016   25 GAUGE PARS PLANA VITRECTOMY WITH 20 GAUGE MVR PORT FOR MACULAR HOLE, MEMBRANE PEEL AND SERUM PATCH WITH ENDOLASER AND C3F8    25 GAUGE PARS PLANA VITRECTOMY WITH 20 GAUGE MVR  PORT FOR MACULAR HOLE Right 08/01/2016   Procedure: 25 GAUGE PARS PLANA VITRECTOMY WITH 20 GAUGE MVR PORT FOR MACULAR HOLE, MEMBRANE PEEL AND SERUM PATCH WITH ENDOLASER AND C3F8;  Surgeon: Hayden Pedro, MD;  Location: [place];  Service: Ophthalmology;  Laterality: Right;   APPENDECTOMY  1980's   CATARACT EXTRACTION  07/2017   right eye   CHOLECYSTECTOMY  ~ 2006   COLONOSCOPY     CORONARY ANGIOPLASTY WITH STENT PLACEMENT  04/2013   LAD   GAS/FLUID EXCHANGE Right 09/16/2013   Procedure: AIR/FLUID EXCHANGE;  Surgeon: Hayden Pedro, MD;  Location: [place];  Service: Ophthalmology;  Laterality: Right;   GAS/FLUID EXCHANGE Right 08/01/2016   Procedure: GAS/FLUID EXCHANGE;  Surgeon: Hayden Pedro, MD;  Location: [place];  Service: Ophthalmology;  Laterality: Right;   KNEE ARTHROSCOPY Right ~ 1984   LEFT HEART CATH AND CORONARY ANGIOGRAPHY N/A 10/14/2019   Procedure: LEFT HEART CATH AND CORONARY ANGIOGRAPHY;  Surgeon: Martinique, Peter M, MD;  Location: [place] [facility];  Service: Cardiovascular;  Laterality: N/A;   LEFT HEART CATH AND CORONARY ANGIOGRAPHY N/A 07/01/2021   Procedure: LEFT HEART CATH AND CORONARY ANGIOGRAPHY;  Surgeon: Burnell Blanks, MD;  Location: [place] [facility];  Service: Cardiovascular;  Laterality: N/A;   LEFT HEART CATHETERIZATION WITH CORONARY ANGIOGRAM N/A 05/03/2012   Procedure: LEFT HEART CATHETERIZATION WITH CORONARY ANGIOGRAM;  Surgeon: Peter M Martinique, MD;  Location[facility];  Service: Cardiovascular;  Laterality: N/A;   MEMBRANE PEEL Right 09/16/2013   Procedure: MEMBRANE PEEL;  Surgeon: Hayden Pedro, MD;  Location: [place];  Service: Ophthalmology;  Laterality: Right;   PARS PLANA VITRECTOMY Right 09/16/2013   w/membrane peel/notes 11/25/20145   PARS PLANA VITRECTOMY Right 09/16/2013   Procedure: PARS PLANA VITRECTOMY WITH 25 GAUGE;  Surgeon: Hayden Pedro, MD;  Location: [place];  Service: Ophthalmology;  Laterality: Right;   PHOTOCOAGULATION WITH LASER  Right 09/16/2013   Procedure: PHOTOCOAGULATION WITH LASER;  Surgeon: Hayden Pedro, MD;  Location: [place];  Service: Ophthalmology;  Laterality: Right;  ENDOLASER   POLYPECTOMY     SERUM PATCH Right 08/01/2016   Procedure: SERUM PATCH;  Surgeon: Hayden Pedro, MD;  Location: [place];  Service: Ophthalmology;  Laterality: Right;   TONSILLECTOMY AND ADENOIDECTOMY  1970's    Family History  Problem Relation Age of Onset   Retinitis pigmentosa Father    Coronary artery disease Brother 15       Died   Heart attack Brother    Heart disease Brother    Hypertension Mother    Coronary artery disease Maternal Uncle 22       Died   Coronary artery disease Maternal  Grandmother 42       Died   Aneurysm Maternal Grandfather 85       Abdominal   Aneurysm Daughter 16       Cerebral   Colon polyps Neg Hx    Colon cancer Neg Hx    Esophageal cancer Neg Hx    Stomach cancer Neg Hx    Rectal cancer Neg Hx     Social History   Socioeconomic History   Marital status: Married    Spouse name: Not on file   Number of children: 1   Years of education: Not on file   Highest education level: Not on file  Occupational History   Occupation: Furniture buisiness  Tobacco Use   Smoking status: Never   Smokeless tobacco: Never  Vaping Use   Vaping Use: Never used  Substance and Sexual Activity   Alcohol use: Yes    Comment: rare   Drug use: No   Sexual activity: Yes  Other Topics Concern   Not on file  Social History Narrative   Lives with wife and 3 step children.   Social Determinants of Health   Financial Resource Strain: Not on file  Food Insecurity: Not on file  Transportation Needs: Not on file  Physical Activity: Not on file  Stress: Not on file  Social Connections: Not on file  Intimate Partner Violence: Not on file    Review of Systems  Constitutional:  Positive for fatigue.  Respiratory:  Positive for apnea.   Psychiatric/Behavioral:  Positive for sleep disturbance.     Vitals:   08/19/21 0938  BP: 124/62  Pulse: 63  Temp: 97.7 F (36.5 C)  SpO2: 99%     Physical Exam Constitutional:      Appearance: He is obese.  HENT:     Head: Normocephalic.     Nose: Nose normal.     Mouth/Throat:     Mouth: Mucous membranes are moist.     Comments: Mallampati 3, crowded oropharynx Eyes:     Pupils: Pupils are equal, round, and reactive to light.  Cardiovascular:     Rate and Rhythm: Normal rate and regular rhythm.     Heart sounds: No murmur heard.   No friction rub.  Pulmonary:     Effort: Pulmonary effort is normal. No respiratory distress.     Breath sounds: No stridor. No wheezing or rhonchi.  Musculoskeletal:     Cervical back: No rigidity or tenderness.  Neurological:     Mental Status: He is alert.  Psychiatric:        Mood and Affect: Mood normal.   Results of the Epworth flowsheet 08/19/2021  Sitting and reading 3  Watching TV 3  Sitting, inactive in a public place (e.g. 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  Shortness of breath on exertion  History of asthma for which he is  on Advair, rescue inhaler as needed  Diabetes  Deconditioning  History of coronary artery disease  Pathophysiology of sleep disordered breathing reviewed Treatment options reviewed  Plan/Recommendations: Graded exercise as tolerated  Continue albuterol  I did encourage him to  follow-up with cardiology office to make sure that his CPAP is set up -he is for auto titrating CPAP -Importance of treating the sleep disordered breathing discussed  We will schedule him for pulmonary function tests  Chest x-ray today  Encouraged to continue weight loss efforts  I will see him in 6 to 8 weeks  Sherrilyn Rist MD [facility] 08/19/2021, 10:53 AM  CC: [name], NP

## 2021-08-25 NOTE — Telephone Encounter (Signed)
AO please advise on the CXR results.  thanks

## 2021-08-26 ENCOUNTER — Other Ambulatory Visit (HOSPITAL_BASED_OUTPATIENT_CLINIC_OR_DEPARTMENT_OTHER): Payer: Self-pay | Admitting: Family

## 2021-08-26 NOTE — Telephone Encounter (Signed)
Normal chest x-ray  No evidence of infection or findings of concern

## 2021-08-29 ENCOUNTER — Other Ambulatory Visit: Payer: Self-pay | Admitting: Cardiology

## 2021-09-23 NOTE — Telephone Encounter (Signed)
Hello Mr [name], [facility] can call this number to check on your cpap status. [phone].

## 2021-09-23 NOTE — Telephone Encounter (Signed)
Patient called and said he still has not heard anything from [employer] about his CPAP machine. He wanted to know if our office had a phone number that he could call them directly

## 2021-09-30 ENCOUNTER — Other Ambulatory Visit: Payer: Self-pay | Admitting: Cardiology

## 2021-10-04 ENCOUNTER — Encounter (INDEPENDENT_AMBULATORY_CARE_PROVIDER_SITE_OTHER): Payer: 59 | Admitting: Ophthalmology

## 2021-10-04 ENCOUNTER — Other Ambulatory Visit: Payer: Self-pay

## 2021-10-04 DIAGNOSIS — H43812 Vitreous degeneration, left eye: Secondary | ICD-10-CM

## 2021-10-04 DIAGNOSIS — I10 Essential (primary) hypertension: Secondary | ICD-10-CM

## 2021-10-04 DIAGNOSIS — H35033 Hypertensive retinopathy, bilateral: Secondary | ICD-10-CM

## 2021-10-04 DIAGNOSIS — E103593 Type 1 diabetes mellitus with proliferative diabetic retinopathy without macular edema, bilateral: Secondary | ICD-10-CM | POA: Diagnosis not present

## 2021-10-05 ENCOUNTER — Other Ambulatory Visit: Payer: Self-pay | Admitting: *Deleted

## 2021-10-05 DIAGNOSIS — R0602 Shortness of breath: Secondary | ICD-10-CM

## 2021-10-06 ENCOUNTER — Ambulatory Visit (INDEPENDENT_AMBULATORY_CARE_PROVIDER_SITE_OTHER): Payer: 59 | Admitting: Pulmonary Disease

## 2021-10-06 ENCOUNTER — Encounter: Payer: Self-pay | Admitting: Pulmonary Disease

## 2021-10-06 ENCOUNTER — Other Ambulatory Visit: Payer: Self-pay

## 2021-10-06 VITALS — BP 120/70 | HR 82 | Temp 97.8°F | Ht 71.0 in | Wt 225.0 lb

## 2021-10-06 DIAGNOSIS — G4733 Obstructive sleep apnea (adult) (pediatric): Secondary | ICD-10-CM | POA: Diagnosis not present

## 2021-10-06 DIAGNOSIS — R0602 Shortness of breath: Secondary | ICD-10-CM

## 2021-10-06 LAB — PULMONARY FUNCTION TEST
DL/VA % pred: 141 %
DL/VA: 6.08 ml/min/mmHg/L
DLCO cor % pred: 125 %
DLCO cor: 36.73 ml/min/mmHg
DLCO unc % pred: 125 %
DLCO unc: 36.73 ml/min/mmHg
FEF 25-75 Post: 3.57 L/sec
FEF 25-75 Pre: 3.4 L/sec
FEF2575-%Change-Post: 5 %
FEF2575-%Pred-Post: 109 %
FEF2575-%Pred-Pre: 104 %
FEV1-%Change-Post: 2 %
FEV1-%Pred-Post: 84 %
FEV1-%Pred-Pre: 82 %
FEV1-Post: 3.25 L
FEV1-Pre: 3.17 L
FEV1FVC-%Change-Post: -2 %
FEV1FVC-%Pred-Pre: 106 %
FEV6-%Change-Post: 4 %
FEV6-%Pred-Post: 83 %
FEV6-%Pred-Pre: 80 %
FEV6-Post: 4.08 L
FEV6-Pre: 3.89 L
FEV6FVC-%Pred-Post: 104 %
FEV6FVC-%Pred-Pre: 104 %
FVC-%Change-Post: 4 %
FVC-%Pred-Post: 80 %
FVC-%Pred-Pre: 76 %
FVC-Post: 4.08 L
FVC-Pre: 3.89 L
Post FEV1/FVC ratio: 80 %
Post FEV6/FVC ratio: 100 %
Pre FEV1/FVC ratio: 82 %
Pre FEV6/FVC Ratio: 100 %
RV % pred: 142 %
RV: 3.16 L
TLC % pred: 99 %
TLC: 7.16 L

## 2021-10-06 NOTE — Patient Instructions (Signed)
Full PFT performed today. °

## 2021-10-06 NOTE — Progress Notes (Signed)
[name]    [id number]    Sep 15, 1966  Primary Care Physician:Whyte, Gildardo Griffes, MD  Referring Physician: Maris Berger, MD [address] [place],  [place] [address]  Chief complaint:   Shortness of breath on exertion  HPI:  Has had significant health problems in the last year and a half mostly from October 2021  Had symptoms suggestive of COVID but tested negative, Developed shortness of breath with activity after that  Required albuterol and Advair Denies any past history of lung disease -Has had prior bouts of bronchitis pneumonias in the past  Work-up so far has been negative. Cardiac work-up negative Echocardiogram within normal limits Pulmonary function test today within normal limits Chest x-ray within normal limits  He reports feeling better than the last time he was in the office, exercise tolerance improving  There was concern for obstructive sleep apnea, had a home sleep study performed which showed mild obstructive sleep apnea  Sugar was uncontrolled but was started on insulin pump recently which coincided with onset of improvement in symptoms  Never smoker No pertinent occupational history  Usually goes to bed between 11 and 12 Falls asleep soon after Few awakenings Final wake up time about 6 AM  Admits to snoring, daytime sleepiness  Overall breathing is getting better Cough is better   Outpatient Encounter Medications as of 10/06/2021  Medication Sig   ACCU-CHEK AVIVA PLUS test strip 1 each by Other route as needed.    ADVAIR DISKUS 250-50 MCG/DOSE AEPB Inhale 1 puff into the lungs daily.    albuterol (PROVENTIL HFA;VENTOLIN HFA) 108 (90 Base) MCG/ACT inhaler Inhale 1 puff into the lungs every 6 (six) hours as needed for wheezing or shortness of breath.   Alirocumab (PRALUENT) 75 MG/ML SOAJ Inject 1 Dose into the skin every 14 (fourteen) days.   aspirin 81 MG tablet Take 81 mg by mouth daily.   azelastine (ASTELIN) 0.1  % nasal spray Place 1 spray into the nose daily.    Continuous Blood Gluc Sensor (DEXCOM G6 SENSOR) MISC    insulin aspart (NOVOLOG FLEXPEN) 100 UNIT/ML FlexPen To be inject 3-4 times daily for meals or snacks at directed, up to 100 units daily.   insulin degludec (TRESIBA) 100 UNIT/ML FlexTouch Pen Inject 38 Units into the skin every morning.    isosorbide mononitrate (IMDUR) 60 MG 24 hr tablet Take 1 tablet (60 mg total) by mouth daily.   losartan (COZAAR) 25 MG tablet TAKE 1 TABLET(25 MG) BY MOUTH DAILY   Multiple Vitamins-Minerals (AIRBORNE GUMMIES) CHEW Chew 3 capsules by mouth daily.   nitroGLYCERIN (NITROSTAT) 0.4 MG SL tablet PLACE 1 TABLET UNDER THE TONGUE EVERY 5 MINUTES AS NEEDED FOR CHEST PAIN AS DIRECTED   omeprazole (PRILOSEC) 20 MG capsule Take 20 mg by mouth daily.   rosuvastatin (CRESTOR) 5 MG tablet Take 1 tablet (5 mg total) by mouth daily at 6 PM.   Semaglutide, 1 MG/DOSE, (OZEMPIC, 1 MG/DOSE,) 2 MG/1.5ML SOPN Inject 1 mg into the skin every Monday.    No facility-administered encounter medications on file as of 10/06/2021.    Allergies as of 10/06/2021   (No Known Allergies)    Past Medical History:  Diagnosis Date   Coronary artery disease    s/p NSTEMI after ETT-echo - [facility] 7/13:  LM < 10%, pLAD 90%, mLAD 95%, pCFX tandem 90%, mCFX 80-90%, pRCA 30%, mRCA 40-50%;  PCI: Xience DES x 2 to  prox and mid LAD; Xience DES x 1 to prox and mid CFX   Family history of anesthesia complication    nausea    GERD (gastroesophageal reflux disease)    HLD (hyperlipidemia)    Hypertension    Lung nodule 2013   CT 7/13: 3 mm RML nodule - felt to be stable and benign/ follow up in 2014   Myocardial infarction [facility]) 2013   have 4 stents   S/P coronary artery stent placement    Type I diabetes mellitus (HCC)    insulin dependent    Past Surgical History:  Procedure Laterality Date   25 GAUGE PARS PLANA VITRECTOMY WITH 20 GAUGE MVR PORT FOR MACULAR HOLE Right 08/01/2016   25  GAUGE PARS PLANA VITRECTOMY WITH 20 GAUGE MVR PORT FOR MACULAR HOLE, MEMBRANE PEEL AND SERUM PATCH WITH ENDOLASER AND C3F8    25 GAUGE PARS PLANA VITRECTOMY WITH 20 GAUGE MVR PORT FOR MACULAR HOLE Right 08/01/2016   Procedure: 25 GAUGE PARS PLANA VITRECTOMY WITH 20 GAUGE MVR PORT FOR MACULAR HOLE, MEMBRANE PEEL AND SERUM PATCH WITH ENDOLASER AND C3F8;  Surgeon: Hayden Pedro, MD;  Location: [place];  Service: Ophthalmology;  Laterality: Right;   APPENDECTOMY  1980's   CATARACT EXTRACTION  07/2017   right eye   CHOLECYSTECTOMY  ~ 2006   COLONOSCOPY     CORONARY ANGIOPLASTY WITH STENT PLACEMENT  04/2013   LAD   GAS/FLUID EXCHANGE Right 09/16/2013   Procedure: AIR/FLUID EXCHANGE;  Surgeon: Hayden Pedro, MD;  Location: [place];  Service: Ophthalmology;  Laterality: Right;   GAS/FLUID EXCHANGE Right 08/01/2016   Procedure: GAS/FLUID EXCHANGE;  Surgeon: Hayden Pedro, MD;  Location: [place];  Service: Ophthalmology;  Laterality: Right;   KNEE ARTHROSCOPY Right ~ 1984   LEFT HEART CATH AND CORONARY ANGIOGRAPHY N/A 10/14/2019   Procedure: LEFT HEART CATH AND CORONARY ANGIOGRAPHY;  Surgeon: Martinique, Peter M, MD;  Location: [place] [facility];  Service: Cardiovascular;  Laterality: N/A;   LEFT HEART CATH AND CORONARY ANGIOGRAPHY N/A 07/01/2021   Procedure: LEFT HEART CATH AND CORONARY ANGIOGRAPHY;  Surgeon: Burnell Blanks, MD;  Location: [place] [facility];  Service: Cardiovascular;  Laterality: N/A;   LEFT HEART CATHETERIZATION WITH CORONARY ANGIOGRAM N/A 05/03/2012   Procedure: LEFT HEART CATHETERIZATION WITH CORONARY ANGIOGRAM;  Surgeon: Peter M Martinique, MD;  Location: [facility] CATH LAB;  Service: Cardiovascular;  Laterality: N/A;   MEMBRANE PEEL Right 09/16/2013   Procedure: MEMBRANE PEEL;  Surgeon: Hayden Pedro, MD;  Location: [place];  Service: Ophthalmology;  Laterality: Right;   PARS PLANA VITRECTOMY Right 09/16/2013   w/membrane peel/notes 11/25/20145   PARS PLANA VITRECTOMY Right 09/16/2013    Procedure: PARS PLANA VITRECTOMY WITH 25 GAUGE;  Surgeon: Hayden Pedro, MD;  Location: [place];  Service: Ophthalmology;  Laterality: Right;   PHOTOCOAGULATION WITH LASER Right 09/16/2013   Procedure: PHOTOCOAGULATION WITH LASER;  Surgeon: Hayden Pedro, MD;  Location: [place];  Service: Ophthalmology;  Laterality: Right;  ENDOLASER   POLYPECTOMY     SERUM PATCH Right 08/01/2016   Procedure: SERUM PATCH;  Surgeon: Hayden Pedro, MD;  Location: [place];  Service: Ophthalmology;  Laterality: Right;   TONSILLECTOMY AND ADENOIDECTOMY  41's    Family History  Problem Relation Age of Onset   Retinitis pigmentosa Father    Coronary artery disease Brother 52       Died   Heart attack Brother    Heart disease Brother  Hypertension Mother    Coronary artery disease Maternal Uncle 43       Died   Coronary artery disease Maternal Grandmother 70       Died   Aneurysm Maternal Grandfather 85       Abdominal   Aneurysm Daughter 16       Cerebral   Colon polyps Neg Hx    Colon cancer Neg Hx    Esophageal cancer Neg Hx    Stomach cancer Neg Hx    Rectal cancer Neg Hx     Social History   Socioeconomic History   Marital status: Married    Spouse name: Not on file   Number of children: 1   Years of education: Not on file   Highest education level: Not on file  Occupational History   Occupation: Furniture buisiness  Tobacco Use   Smoking status: Never   Smokeless tobacco: Never  Vaping Use   Vaping Use: Never used  Substance and Sexual Activity   Alcohol use: Yes    Comment: rare   Drug use: No   Sexual activity: Yes  Other Topics Concern   Not on file  Social History Narrative   Lives with wife and 3 step children.   Social Determinants of Health   Financial Resource Strain: Not on file  Food Insecurity: Not on file  Transportation Needs: Not on file  Physical Activity: Not on file  Stress: Not on file  Social Connections: Not on file  Intimate Partner Violence:  Not on file    Review of Systems  Constitutional:  Positive for fatigue.  Respiratory:  Positive for apnea.   Psychiatric/Behavioral:  Positive for sleep disturbance.    There were no vitals filed for this visit.    Physical Exam Constitutional:      Appearance: He is obese.  HENT:     Head: Normocephalic.     Nose: Nose normal.     Mouth/Throat:     Mouth: Mucous membranes are moist.     Comments: Mallampati 3, crowded oropharynx Eyes:     Pupils: Pupils are equal, round, and reactive to light.  Cardiovascular:     Rate and Rhythm: Normal rate and regular rhythm.     Heart sounds: No murmur heard.   No friction rub.  Pulmonary:     Effort: Pulmonary effort is normal. No respiratory distress.     Breath sounds: No stridor. No wheezing or rhonchi.  Musculoskeletal:     Cervical back: No rigidity or tenderness.  Neurological:     Mental Status: He is alert.  Psychiatric:        Mood and Affect: Mood normal.   Results of the Epworth flowsheet 08/19/2021  Sitting and reading 3  Watching TV 3  Sitting, inactive in a public place (e.g. a theatre or a meeting) 0  As a passenger in a car for an hour without a break 0  Lying down to rest in the afternoon when circumstances permit 3  Sitting and talking to someone 2  Sitting quietly after a lunch without alcohol 3  In a car, while stopped for a few minutes in traffic 0  Total score 14    Data Reviewed: Recent sleep study performed showing mild obstructive sleep apnea  Recent echocardiogram within normal limits with some LVH  Pulmonary function test reviewed with the patient was within normal limits  Chest x-ray from last visit was normal  Assessment:  Excessive daytime sleepiness -Better  Mild obstructive sleep apnea with excessive daytime sleepiness  Post viral fatigue -Appears to be getting better  Shortness of breath on exertion -Symptoms are improving currently  Reports he never really had a history of  asthma -We are going to try being off Advair and try and use albuterol as needed  Diabetes  Deconditioning  History of coronary artery disease  Plan/Recommendations: Graded exercise as tolerated -Importance of exercises discussed  Continue albuterol Hold off on Advair for few weeks  I did encourage him to follow-up with cardiology office to make sure that his CPAP is set up -he is for auto titrating CPAP -Importance of treating the sleep disordered breathing discussed  Encouraged to continue weight loss efforts  Follow-up in 6 months  Sherrilyn Rist MD [facility] 10/06/2021, 1:11 PM  CC: Maris Berger, MD

## 2021-10-06 NOTE — Progress Notes (Signed)
PFT Full performed today.

## 2021-10-06 NOTE — Patient Instructions (Signed)
All your studies have come back normal  Stay off Advair for about a week or 2 and if you are not reaching for albuterol more than twice a week then you can stay off the Advair completely  Call with significant concerns  You may use your albuterol as needed  Graded exercise as tolerated  I will see you in about 6 months and if things are fine after that we will just see you as needed

## 2021-10-26 ENCOUNTER — Ambulatory Visit (HOSPITAL_BASED_OUTPATIENT_CLINIC_OR_DEPARTMENT_OTHER): Payer: 59 | Admitting: Internal Medicine

## 2021-11-19 ENCOUNTER — Other Ambulatory Visit: Payer: Self-pay | Admitting: Internal Medicine

## 2021-12-12 ENCOUNTER — Other Ambulatory Visit: Payer: Self-pay | Admitting: Cardiology

## 2021-12-16 ENCOUNTER — Ambulatory Visit: Payer: 59 | Admitting: Cardiology

## 2021-12-28 ENCOUNTER — Telehealth: Payer: Self-pay | Admitting: Cardiology

## 2021-12-28 NOTE — Telephone Encounter (Signed)
Left message to call back  

## 2021-12-28 NOTE — Telephone Encounter (Signed)
Calling to see if the patient stint is plastic or metal need to now for his cpap machine. Please advise  ?

## 2021-12-29 ENCOUNTER — Ambulatory Visit: Payer: 59 | Admitting: Cardiology

## 2021-12-29 NOTE — Telephone Encounter (Signed)
LMTCB

## 2022-01-04 NOTE — Telephone Encounter (Signed)
Spoke with pt wife, she reports they were told he could not use the cpap mask because it secures with a magnet if he has a stent. The patient does not have a pacer or icd. Okay given for the patient to use the mask.  ?

## 2022-01-17 ENCOUNTER — Other Ambulatory Visit: Payer: Self-pay | Admitting: Internal Medicine

## 2022-01-17 DIAGNOSIS — E785 Hyperlipidemia, unspecified: Secondary | ICD-10-CM

## 2022-01-21 DIAGNOSIS — I10 Essential (primary) hypertension: Secondary | ICD-10-CM | POA: Diagnosis not present

## 2022-01-21 DIAGNOSIS — G4733 Obstructive sleep apnea (adult) (pediatric): Secondary | ICD-10-CM | POA: Diagnosis not present

## 2022-01-21 DIAGNOSIS — R5383 Other fatigue: Secondary | ICD-10-CM | POA: Diagnosis not present

## 2022-01-24 DIAGNOSIS — H25812 Combined forms of age-related cataract, left eye: Secondary | ICD-10-CM | POA: Diagnosis not present

## 2022-01-24 DIAGNOSIS — H401111 Primary open-angle glaucoma, right eye, mild stage: Secondary | ICD-10-CM | POA: Diagnosis not present

## 2022-01-24 DIAGNOSIS — H40032 Anatomical narrow angle, left eye: Secondary | ICD-10-CM | POA: Diagnosis not present

## 2022-01-27 ENCOUNTER — Ambulatory Visit: Payer: 59 | Admitting: Internal Medicine

## 2022-02-06 ENCOUNTER — Other Ambulatory Visit (HOSPITAL_BASED_OUTPATIENT_CLINIC_OR_DEPARTMENT_OTHER): Payer: Self-pay | Admitting: Family

## 2022-02-08 DIAGNOSIS — H25812 Combined forms of age-related cataract, left eye: Secondary | ICD-10-CM | POA: Diagnosis not present

## 2022-02-08 DIAGNOSIS — H268 Other specified cataract: Secondary | ICD-10-CM | POA: Diagnosis not present

## 2022-02-12 DIAGNOSIS — R5383 Other fatigue: Secondary | ICD-10-CM | POA: Diagnosis not present

## 2022-02-12 DIAGNOSIS — G4733 Obstructive sleep apnea (adult) (pediatric): Secondary | ICD-10-CM | POA: Diagnosis not present

## 2022-02-12 DIAGNOSIS — I10 Essential (primary) hypertension: Secondary | ICD-10-CM | POA: Diagnosis not present

## 2022-02-12 NOTE — Progress Notes (Signed)
?  ?Cardiology Office Note ? ? ?Date:  02/13/2022  ? ?ID:  [name], DOB 1966/08/07, MRN [id number] ? ?PCP:  Maris Berger, MD  ?Cardiologist:   Minus Breeding, MD ? ? ?Chief Complaint  ?Patient presents with  ? Coronary Artery Disease  ? Shortness of Breath  ? ? ?  ?History of Present Illness: ?[name] is a 56 y.o. male who presents for followup of his coronary disease. He is status post multivessel PCI.  He did have a stress test in 2017. He had an equivocal stress test had a cath with results below from 2022  ? ?Since I last saw him he saw Laurann Montana NP in our office.  He had a sleep study.  This demonstrated mild sleep apnea.  He was started on CP.  He was also having significant SOB and he was started on Imdur.  He has had great improvement with this. ? ?He did have possible COVID two years ago although he tested negative.  He had pneumonia times 2 last year.  He did have a cath as mentioned with an occluded circ OM that was not different than 2020.  He did gain weight with all of his illnesses but is now walking 30 min most days and has lost 20 lbs.  The patient denies any new symptoms such as chest discomfort, neck or arm discomfort. There has been no new shortness of breath, PND or orthopnea. There have been no reported palpitations, presyncope or syncope.  ? ? ?Past Medical History:  ?Diagnosis Date  ? Coronary artery disease   ? s/p NSTEMI after ETT-echo - [facility] 7/13:  LM < 10%, pLAD 90%, mLAD 95%, pCFX tandem 90%, mCFX 80-90%, pRCA 30%, mRCA 40-50%;  PCI: Xience DES x 2 to prox and mid LAD; Xience DES x 1 to prox and mid CFX  ? Family history of anesthesia complication   ? nausea   ? GERD (gastroesophageal reflux disease)   ? HLD (hyperlipidemia)   ? Hypertension   ? Lung nodule 2013  ? CT 7/13: 3 mm RML nodule - felt to be stable and benign/ follow up in 2014  ? Myocardial infarction [facility]) 2013  ? have 4 stents  ? S/P coronary artery stent placement   ? Type I diabetes mellitus ([place])   ?  insulin dependent  ? ? ?Past Surgical History:  ?Procedure Laterality Date  ? 25 GAUGE PARS PLANA VITRECTOMY WITH 20 GAUGE MVR PORT FOR MACULAR HOLE Right 08/01/2016  ? 25 GAUGE PARS PLANA VITRECTOMY WITH 20 GAUGE MVR PORT FOR MACULAR HOLE, MEMBRANE PEEL AND SERUM PATCH WITH ENDOLASER AND C3F8   ? 25 GAUGE PARS PLANA VITRECTOMY WITH 20 GAUGE MVR PORT FOR MACULAR HOLE Right 08/01/2016  ? Procedure: 25 GAUGE PARS PLANA VITRECTOMY WITH 20 GAUGE MVR PORT FOR MACULAR HOLE, MEMBRANE PEEL AND SERUM PATCH WITH ENDOLASER AND C3F8;  Surgeon: Hayden Pedro, MD;  Location: [place];  Service: Ophthalmology;  Laterality: Right;  ? APPENDECTOMY  1980's  ? CATARACT EXTRACTION  07/2017  ? right eye  ? CHOLECYSTECTOMY  ~ 2006  ? COLONOSCOPY    ? CORONARY ANGIOPLASTY WITH STENT PLACEMENT  04/2013  ? LAD  ? GAS/FLUID EXCHANGE Right 09/16/2013  ? Procedure: AIR/FLUID EXCHANGE;  Surgeon: Hayden Pedro, MD;  Location: [place];  Service: Ophthalmology;  Laterality: Right;  ? GAS/FLUID EXCHANGE Right 08/01/2016  ? Procedure: GAS/FLUID EXCHANGE;  Surgeon: Hayden Pedro, MD;  Location: [place];  Service: Ophthalmology;  Laterality: Right;  ? KNEE ARTHROSCOPY Right ~ 1984  ? LEFT HEART CATH AND CORONARY ANGIOGRAPHY N/A 10/14/2019  ? Procedure: LEFT HEART CATH AND CORONARY ANGIOGRAPHY;  Surgeon: Martinique, Peter M, MD;  Location: [place] [facility];  Service: Cardiovascular;  Laterality: N/A;  ? LEFT HEART CATH AND CORONARY ANGIOGRAPHY N/A 07/01/2021  ? Procedure: LEFT HEART CATH AND CORONARY ANGIOGRAPHY;  Surgeon: Burnell Blanks, MD;  Location: [place] [facility];  Service: Cardiovascular;  Laterality: N/A;  ? LEFT HEART CATHETERIZATION WITH CORONARY ANGIOGRAM N/A 05/03/2012  ? Procedure: LEFT HEART CATHETERIZATION WITH CORONARY ANGIOGRAM;  Surgeon: Peter M Martinique, MD;  Location: [facility] CATH LAB;  Service: Cardiovascular;  Laterality: N/A;  ? MEMBRANE PEEL Right 09/16/2013  ? Procedure: MEMBRANE PEEL;  Surgeon: Hayden Pedro, MD;  Location: [place];  Service: Ophthalmology;  Laterality: Right;  ? PARS PLANA VITRECTOMY Right 09/16/2013  ? w/membrane peel/notes 11/25/20145  ? PARS PLANA VITRECTOMY Right 09/16/2013  ? Procedure: PARS PLANA VITRECTOMY WITH 25 GAUGE;  Surgeon: Hayden Pedro, MD;  Location: [place];  Service: Ophthalmology;  Laterality: Right;  ? PHOTOCOAGULATION WITH LASER Right 09/16/2013  ? Procedure: PHOTOCOAGULATION WITH LASER;  Surgeon: Hayden Pedro, MD;  Location: [place];  Service: Ophthalmology;  Laterality: Right;  ENDOLASER  ? POLYPECTOMY    ? SERUM PATCH Right 08/01/2016  ? Procedure: SERUM PATCH;  Surgeon: Hayden Pedro, MD;  Location: [place];  Service: Ophthalmology;  Laterality: Right;  ? TONSILLECTOMY AND ADENOIDECTOMY  1970's  ? ? ? ?Current Outpatient Medications  ?Medication Sig Dispense Refill  ? ACCU-CHEK AVIVA PLUS test strip 1 each by Other route as needed.     ? ADVAIR DISKUS 250-50 MCG/DOSE AEPB Inhale 1 puff into the lungs daily.     ? albuterol (PROVENTIL HFA;VENTOLIN HFA) 108 (90 Base) MCG/ACT inhaler Inhale 1 puff into the lungs every 6 (six) hours as needed for wheezing or shortness of breath.    ? Alirocumab (PRALUENT) 75 MG/ML SOAJ Inject 1 Dose into the skin every 14 (fourteen) days. 6 mL 3  ? aspirin 81 MG tablet Take 81 mg by mouth daily.    ? azelastine (ASTELIN) 0.1 % nasal spray Place 1 spray into the nose daily.     ? Continuous Blood Gluc Sensor (DEXCOM G6 SENSOR) MISC     ? fluticasone (FLONASE) 50 MCG/ACT nasal spray Place 1 spray into both nostrils daily.    ? insulin aspart (NOVOLOG FLEXPEN) 100 UNIT/ML FlexPen To be inject 3-4 times daily for meals or snacks at directed, up to 100 units daily.    ? isosorbide mononitrate (IMDUR) 60 MG 24 hr tablet Take 1 tablet (60 mg total) by mouth daily. 90 tablet 1  ? losartan (COZAAR) 25 MG tablet TAKE 1 TABLET(25 MG) BY MOUTH DAILY 90 tablet 2  ? Multiple Vitamins-Minerals (AIRBORNE GUMMIES) CHEW Chew 3 capsules by mouth daily.    ? nitroGLYCERIN (NITROSTAT)  0.4 MG SL tablet PLACE 1 TABLET UNDER THE TONGUE EVERY 5 MINUTES AS NEEDED FOR CHEST PAIN AS DIRECTED 25 tablet 1  ? omeprazole (PRILOSEC) 20 MG capsule Take 20 mg by mouth daily.    ? rosuvastatin (CRESTOR) 5 MG tablet TAKE 1 TABLET(5 MG) BY MOUTH DAILY AT 6 PM 90 tablet 3  ? Semaglutide, 1 MG/DOSE, (OZEMPIC, 1 MG/DOSE,) 2 MG/1.5ML SOPN Inject 1 mg into the skin every Monday.     ? insulin degludec (TRESIBA) 100 UNIT/ML FlexTouch Pen Inject  38 Units into the skin every morning.  (Patient not taking: Reported on 02/13/2022)    ? ?No current facility-administered medications for this visit.  ? ? ?Allergies:   Patient has no known allergies.  ? ? ?ROS:  Please see the history of present illness.   Otherwise, review of systems are positive for none.   All other systems are reviewed and negative.  ? ? ?PHYSICAL EXAM: ?VS:  BP (!) 138/58   Pulse 93   Ht '5\' 11"'$  (1.803 m)   Wt 225 lb 6.4 oz (102.2 kg)   SpO2 98%   BMI 31.44 kg/m?  , BMI Body mass index is 31.44 kg/m?.  ?GENERAL:  Well appearing ?NECK:  No jugular venous distention, waveform within normal limits, carotid upstroke brisk and symmetric, no bruits, no thyromegaly ?LUNGS:  Clear to auscultation bilaterally ?CHEST:  Unremarkable ?HEART:  PMI not displaced or sustained,S1 and S2 within normal limits, no S3, no S4, no clicks, no rubs, no murmurs ?ABD:  Flat, positive bowel sounds normal in frequency in pitch, no bruits, no rebound, no guarding, no midline pulsatile mass, no hepatomegaly, no splenomegaly ?EXT:  2 plus pulses throughout, no edema, no cyanosis no clubbing ? ? ? ? ?Cardiac cath  2022 ?Diagnostic ?Dominance: Co-dominant ? ? ? ?EKG:  EKG is not ordered today. ? ? ? ?Recent Labs: ?05/16/2021: ALT 42 ?05/18/2021: NT-Pro BNP 30 ?06/24/2021: BUN 12; Creatinine, Ser 0.94; Hemoglobin 14.8; Platelets 299; Potassium 3.8; Sodium 141  ? ? ? ?Wt Readings from Last 3 Encounters:  ?02/13/22 225 lb 6.4 oz (102.2 kg)  ?10/06/21 225 lb (102.1 kg)  ?08/19/21 227 lb 6.4  oz (103.1 kg)  ?  ? ? ?Other studies Reviewed: ?Additional studies/ records that were reviewed today include: Office records.  Labs ?Review of the above records demonstrates:  See elsewhere.  ? ? ?ASSESSMENT

## 2022-02-13 ENCOUNTER — Encounter: Payer: Self-pay | Admitting: Cardiology

## 2022-02-13 ENCOUNTER — Ambulatory Visit: Payer: BC Managed Care – PPO | Admitting: Cardiology

## 2022-02-13 VITALS — BP 138/58 | HR 93 | Ht 71.0 in | Wt 225.4 lb

## 2022-02-13 DIAGNOSIS — I2510 Atherosclerotic heart disease of native coronary artery without angina pectoris: Secondary | ICD-10-CM

## 2022-02-13 DIAGNOSIS — E118 Type 2 diabetes mellitus with unspecified complications: Secondary | ICD-10-CM

## 2022-02-13 DIAGNOSIS — I10 Essential (primary) hypertension: Secondary | ICD-10-CM

## 2022-02-13 DIAGNOSIS — E785 Hyperlipidemia, unspecified: Secondary | ICD-10-CM

## 2022-02-13 NOTE — Patient Instructions (Signed)
Medication Instructions:  ?Your Physician recommend you continue on your current medication as directed.   ? ?*If you need a refill on your cardiac medications before your next appointment, please call your pharmacy* ? ? ?Lab Work: ?None ordered today ? ? ?Testing/Procedures: ?None ordered today ? ? ?Follow-Up: ?At [facility], you and your health needs are our priority.  As part of our continuing mission to provide you with exceptional heart care, we have created designated Provider Care Teams.  These Care Teams include your primary Cardiologist (physician) and Advanced Practice Providers (APPs -  Physician Assistants and Nurse Practitioners) who all work together to provide you with the care you need, when you need it. ? ?We recommend signing up for the patient portal called "MyChart".  Sign up information is provided on this After Visit Summary.  MyChart is used to connect with patients for Virtual Visits (Telemedicine).  Patients are able to view lab/test results, encounter notes, upcoming appointments, etc.  Non-urgent messages can be sent to your provider as well.   ?To learn more about what you can do with MyChart, go to [email or web address].   ? ?Your next appointment:   ?6 month(s) ? ?The format for your next appointment:   ?In Person ? ?Provider:   ?Minus Breeding, MD { ? ? ?Important Information About Sugar ? ? ? ? ? ? ?

## 2022-02-20 ENCOUNTER — Telehealth: Payer: Self-pay | Admitting: Internal Medicine

## 2022-02-20 DIAGNOSIS — G4733 Obstructive sleep apnea (adult) (pediatric): Secondary | ICD-10-CM | POA: Diagnosis not present

## 2022-02-20 DIAGNOSIS — I10 Essential (primary) hypertension: Secondary | ICD-10-CM | POA: Diagnosis not present

## 2022-02-20 DIAGNOSIS — R5383 Other fatigue: Secondary | ICD-10-CM | POA: Diagnosis not present

## 2022-02-20 NOTE — Telephone Encounter (Signed)
PA for praluent '75mg'$ /mL submitted via CMM ?(Key: [id number]) ?

## 2022-02-22 MED ORDER — REPATHA SURECLICK 140 MG/ML ~~LOC~~ SOAJ: 1.0000 | SUBCUTANEOUS | 3 refills | Status: DC

## 2022-02-22 NOTE — Telephone Encounter (Signed)
Praluent PA denied as it is not on formulary. Insurance prefers [place]. Discussed with Hilty MD -- OK to change to Repatha and will reassess lipids in 3-4 months. May need to change to lower-potency statin if LDL is low on new PCSK9 ? ? ?

## 2022-02-22 NOTE — Telephone Encounter (Signed)
Additional PA info faxed to [employer] via faxed request ?

## 2022-02-22 NOTE — Telephone Encounter (Signed)
Left detailed message with this info. Lab order mailed ?

## 2022-02-22 NOTE — Addendum Note (Signed)
Addended by: Fidel Levy on: 02/22/2022 04:54 PM ? ? Modules accepted: Orders ? ?

## 2022-02-23 ENCOUNTER — Telehealth: Payer: Self-pay | Admitting: Internal Medicine

## 2022-02-23 NOTE — Telephone Encounter (Signed)
?  Pt c/o medication issue: ? ?1. Name of Medication: Evolocumab (REPATHA SURECLICK) 786 MG/ML SOAJ ? ?2. How are you currently taking this medication (dosage and times per day)? Inject 1 Dose into the skin every 14 (fourteen) days. ? ?3. Are you having a reaction (difficulty breathing--STAT)?  ? ?4. What is your medication issue? Gabriel from [employer] to get prior auth for this meds. He gave ref# [id number] ?

## 2022-02-23 NOTE — Telephone Encounter (Signed)
Will forward to dr hilty's nurse. ?

## 2022-02-24 NOTE — Telephone Encounter (Signed)
PA for Repatha [employer] submitted via CMM ?(Key: [id number]) ?Your request was approved based on the initial information provided at the time of the coverage request submission. Please allow additional time for the final decision to be made and added to the patient's account ?

## 2022-02-27 NOTE — Telephone Encounter (Signed)
Medication approved 02/25/22 - 02/26/23 ?

## 2022-03-01 DIAGNOSIS — J4531 Mild persistent asthma with (acute) exacerbation: Secondary | ICD-10-CM | POA: Diagnosis not present

## 2022-03-13 DIAGNOSIS — E782 Mixed hyperlipidemia: Secondary | ICD-10-CM | POA: Diagnosis not present

## 2022-03-13 DIAGNOSIS — E8881 Metabolic syndrome: Secondary | ICD-10-CM | POA: Diagnosis not present

## 2022-03-13 DIAGNOSIS — Z9641 Presence of insulin pump (external) (internal): Secondary | ICD-10-CM | POA: Diagnosis not present

## 2022-03-13 DIAGNOSIS — E10311 Type 1 diabetes mellitus with unspecified diabetic retinopathy with macular edema: Secondary | ICD-10-CM | POA: Diagnosis not present

## 2022-03-13 DIAGNOSIS — E1069 Type 1 diabetes mellitus with other specified complication: Secondary | ICD-10-CM | POA: Diagnosis not present

## 2022-03-23 DIAGNOSIS — R5383 Other fatigue: Secondary | ICD-10-CM | POA: Diagnosis not present

## 2022-03-23 DIAGNOSIS — I10 Essential (primary) hypertension: Secondary | ICD-10-CM | POA: Diagnosis not present

## 2022-03-23 DIAGNOSIS — G4733 Obstructive sleep apnea (adult) (pediatric): Secondary | ICD-10-CM | POA: Diagnosis not present

## 2022-04-04 ENCOUNTER — Telehealth: Payer: Self-pay | Admitting: Pulmonary Disease

## 2022-04-04 ENCOUNTER — Ambulatory Visit: Payer: BC Managed Care – PPO | Admitting: Pulmonary Disease

## 2022-04-04 ENCOUNTER — Encounter (INDEPENDENT_AMBULATORY_CARE_PROVIDER_SITE_OTHER): Payer: BC Managed Care – PPO | Admitting: Ophthalmology

## 2022-04-04 NOTE — Telephone Encounter (Signed)
I called the patient and left a detailed message per DPR to call back and re-schedule.

## 2022-04-11 ENCOUNTER — Ambulatory Visit: Payer: BC Managed Care – PPO | Admitting: Pulmonary Disease

## 2022-04-11 ENCOUNTER — Encounter (HOSPITAL_BASED_OUTPATIENT_CLINIC_OR_DEPARTMENT_OTHER): Payer: Self-pay | Admitting: Internal Medicine

## 2022-04-11 ENCOUNTER — Ambulatory Visit (HOSPITAL_BASED_OUTPATIENT_CLINIC_OR_DEPARTMENT_OTHER): Payer: BC Managed Care – PPO | Admitting: Internal Medicine

## 2022-04-11 VITALS — BP 118/60 | HR 65 | Ht 71.0 in | Wt 238.7 lb

## 2022-04-11 DIAGNOSIS — T466X5D Adverse effect of antihyperlipidemic and antiarteriosclerotic drugs, subsequent encounter: Secondary | ICD-10-CM | POA: Diagnosis not present

## 2022-04-11 DIAGNOSIS — M791 Myalgia, unspecified site: Secondary | ICD-10-CM

## 2022-04-11 DIAGNOSIS — E785 Hyperlipidemia, unspecified: Secondary | ICD-10-CM

## 2022-04-11 DIAGNOSIS — I25118 Atherosclerotic heart disease of native coronary artery with other forms of angina pectoris: Secondary | ICD-10-CM | POA: Diagnosis not present

## 2022-04-11 DIAGNOSIS — T466X5A Adverse effect of antihyperlipidemic and antiarteriosclerotic drugs, initial encounter: Secondary | ICD-10-CM

## 2022-04-11 NOTE — Patient Instructions (Signed)
Medication Instructions:  Your physician recommends that you continue on your current medications as directed. Please refer to the Current Medication list given to you today.  *If you need a refill on your cardiac medications before your next appointment, please call your pharmacy*   Lab Work: FASTING lab work to check cholesterol in 1 year -- before next appointment  If you have labs (blood work) drawn today and your tests are completely normal, you will receive your results only by: [place] (if you have MyChart) OR A paper copy in the mail If you have any lab test that is abnormal or we need to change your treatment, we will call you to review the results.   Testing/Procedures: NONE   Follow-Up: At [facility], you and your health needs are our priority.  As part of our continuing mission to provide you with exceptional heart care, we have created designated Provider Care Teams.  These Care Teams include your primary Cardiologist (physician) and Advanced Practice Providers (APPs -  Physician Assistants and Nurse Practitioners) who all work together to provide you with the care you need, when you need it.  We recommend signing up for the patient portal called "MyChart".  Sign up information is provided on this After Visit Summary.  MyChart is used to connect with patients for Virtual Visits (Telemedicine).  Patients are able to view lab/test results, encounter notes, upcoming appointments, etc.  Non-urgent messages can be sent to your provider as well.   To learn more about what you can do with MyChart, go to [email or web address].    Your next appointment:   12 month(s)  The format for your next appointment:   In Person or [place] Visit  Provider:   Lyman Bishop MD

## 2022-04-11 NOTE — Progress Notes (Signed)
LIPID CLINIC CONSULT NOTE  Chief Complaint:  Follow-up dyslipidemia  Primary Care Physician: Kevin Berger, MD  Primary Cardiologist:  Kevin Breeding, MD  HPI:  [name] is a 56 y.o. male who is being seen today for the evaluation of dyslipidemia at the request of Kevin Berger, MD. This is a 56 year old male seen today for evaluation management of dyslipidemia at the request of [name].  He has a history of coronary artery disease with NSTEMI and prior PCI.  He also has a type I diabetic since age 37 and other risk factors such as hypertension is noted.  Unfortunately, he has had difficulty tolerating statins.  He had side effects with atorvastatin which caused myalgias.  He also has recently had myalgias and symptoms related to rosuvastatin.  He reports that he has not been taking it regularly which would explain his elevated LDL cholesterol.  Direct LDL was 156 in January and he was referred to the lipid clinic for evaluation of possible PCSK9 inhibitor.  04/11/2022  [name] returns today for follow-up.  He continues to have a good response to PCSK9 inhibitors.  He is now on Repatha based on his insurance preference.  He notes some intermittent hives however do not think it is related to the medicine.  Cholesterol remains low total 79, triglycerides 50, HDL 50 and LDL 16.  PMHx:  Past Medical History:  Diagnosis Date   Coronary artery disease    s/p NSTEMI after ETT-echo - [facility] 7/13:  LM < 10%, pLAD 90%, mLAD 95%, pCFX tandem 90%, mCFX 80-90%, pRCA 30%, mRCA 40-50%;  PCI: Xience DES x 2 to prox and mid LAD; Xience DES x 1 to prox and mid CFX   Family history of anesthesia complication    nausea    GERD (gastroesophageal reflux disease)    HLD (hyperlipidemia)    Hypertension    Lung nodule 2013   CT 7/13: 3 mm RML nodule - felt to be stable and benign/ follow up in 2014   Myocardial infarction [facility]) 2013   have 4 stents   S/P coronary artery stent placement     Type I diabetes mellitus (HCC)    insulin dependent    Past Surgical History:  Procedure Laterality Date   25 GAUGE PARS PLANA VITRECTOMY WITH 20 GAUGE MVR PORT FOR MACULAR HOLE Right 08/01/2016   25 GAUGE PARS PLANA VITRECTOMY WITH 20 GAUGE MVR PORT FOR MACULAR HOLE, MEMBRANE PEEL AND SERUM PATCH WITH ENDOLASER AND C3F8    25 GAUGE PARS PLANA VITRECTOMY WITH 20 GAUGE MVR PORT FOR MACULAR HOLE Right 08/01/2016   Procedure: 25 GAUGE PARS PLANA VITRECTOMY WITH 20 GAUGE MVR PORT FOR MACULAR HOLE, MEMBRANE PEEL AND SERUM PATCH WITH ENDOLASER AND C3F8;  Surgeon: Hayden Pedro, MD;  Location: [place];  Service: Ophthalmology;  Laterality: Right;   APPENDECTOMY  1980's   CATARACT EXTRACTION  07/2017   right eye   CHOLECYSTECTOMY  ~ 2006   COLONOSCOPY     CORONARY ANGIOPLASTY WITH STENT PLACEMENT  04/2013   LAD   GAS/FLUID EXCHANGE Right 09/16/2013   Procedure: AIR/FLUID EXCHANGE;  Surgeon: Hayden Pedro, MD;  Location: [place];  Service: Ophthalmology;  Laterality: Right;   GAS/FLUID EXCHANGE Right 08/01/2016   Procedure: GAS/FLUID EXCHANGE;  Surgeon: Hayden Pedro, MD;  Location: [place];  Service: Ophthalmology;  Laterality: Right;   KNEE ARTHROSCOPY Right ~ 1984   LEFT HEART CATH AND CORONARY ANGIOGRAPHY N/A 10/14/2019  Procedure: LEFT HEART CATH AND CORONARY ANGIOGRAPHY;  Surgeon: Martinique, Peter M, MD;  Location: [place] CV LAB;  Service: Cardiovascular;  Laterality: N/A;   LEFT HEART CATH AND CORONARY ANGIOGRAPHY N/A 07/01/2021   Procedure: LEFT HEART CATH AND CORONARY ANGIOGRAPHY;  Surgeon: Burnell Blanks, MD;  Location: [place] CV LAB;  Service: Cardiovascular;  Laterality: N/A;   LEFT HEART CATHETERIZATION WITH CORONARY ANGIOGRAM N/A 05/03/2012   Procedure: LEFT HEART CATHETERIZATION WITH CORONARY ANGIOGRAM;  Surgeon: Peter M Martinique, MD;  Location: [facility] CATH LAB;  Service: Cardiovascular;  Laterality: N/A;   MEMBRANE PEEL Right 09/16/2013   Procedure: MEMBRANE PEEL;  Surgeon:  Hayden Pedro, MD;  Location: [place];  Service: Ophthalmology;  Laterality: Right;   PARS PLANA VITRECTOMY Right 09/16/2013   w/membrane peel/notes 11/25/20145   PARS PLANA VITRECTOMY Right 09/16/2013   Procedure: PARS PLANA VITRECTOMY WITH 25 GAUGE;  Surgeon: Hayden Pedro, MD;  Location: [place];  Service: Ophthalmology;  Laterality: Right;   PHOTOCOAGULATION WITH LASER Right 09/16/2013   Procedure: PHOTOCOAGULATION WITH LASER;  Surgeon: Hayden Pedro, MD;  Location: [place];  Service: Ophthalmology;  Laterality: Right;  ENDOLASER   POLYPECTOMY     SERUM PATCH Right 08/01/2016   Procedure: SERUM PATCH;  Surgeon: Hayden Pedro, MD;  Location: [place];  Service: Ophthalmology;  Laterality: Right;   TONSILLECTOMY AND ADENOIDECTOMY  1970's    FAMHx:  Family History  Problem Relation Age of Onset   Retinitis pigmentosa Father    Coronary artery disease Brother 77       Died   Heart attack Brother    Heart disease Brother    Hypertension Mother    Coronary artery disease Maternal Uncle 98       Died   Coronary artery disease Maternal Grandmother 18       Died   Aneurysm Maternal Grandfather 85       Abdominal   Aneurysm Daughter 16       Cerebral   Colon polyps Neg Hx    Colon cancer Neg Hx    Esophageal cancer Neg Hx    Stomach cancer Neg Hx    Rectal cancer Neg Hx     SOCHx:   reports that he has never smoked. He has never used smokeless tobacco. He reports current alcohol use. He reports that he does not use drugs.  ALLERGIES:  No Known Allergies  ROS: Pertinent items noted in HPI and remainder of comprehensive ROS otherwise negative.  HOME MEDS: Current Outpatient Medications on File Prior to Visit  Medication Sig Dispense Refill   ACCU-CHEK AVIVA PLUS test strip 1 each by Other route as needed.      ADVAIR DISKUS 250-50 MCG/DOSE AEPB Inhale 1 puff into the lungs daily.      albuterol (PROVENTIL HFA;VENTOLIN HFA) 108 (90 Base) MCG/ACT inhaler Inhale 1 puff into the  lungs every 6 (six) hours as needed for wheezing or shortness of breath.     aspirin 81 MG tablet Take 81 mg by mouth daily.     azelastine (ASTELIN) 0.1 % nasal spray Place 1 spray into the nose daily.      Coenzyme Q10 100 MG capsule Take by mouth.     Continuous Blood Gluc Sensor (DEXCOM G6 SENSOR) MISC      Evolocumab (REPATHA SURECLICK) 662 MG/ML SOAJ Inject 1 Dose into the skin every 14 (fourteen) days. 6 mL 3   fluticasone (FLONASE) 50 MCG/ACT nasal spray Place  1 spray into both nostrils daily.     insulin aspart (NOVOLOG FLEXPEN) 100 UNIT/ML FlexPen To be inject 3-4 times daily for meals or snacks at directed, up to 100 units daily.     insulin degludec (TRESIBA) 100 UNIT/ML FlexTouch Pen Inject 38 Units into the skin every morning.     isosorbide mononitrate (IMDUR) 60 MG 24 hr tablet Take 1 tablet (60 mg total) by mouth daily. 90 tablet 1   losartan (COZAAR) 25 MG tablet TAKE 1 TABLET(25 MG) BY MOUTH DAILY 90 tablet 2   Multiple Vitamins-Minerals (AIRBORNE GUMMIES) CHEW Chew 3 capsules by mouth daily.     nitroGLYCERIN (NITROSTAT) 0.4 MG SL tablet PLACE 1 TABLET UNDER THE TONGUE EVERY 5 MINUTES AS NEEDED FOR CHEST PAIN AS DIRECTED 25 tablet 1   omeprazole (PRILOSEC) 20 MG capsule Take 20 mg by mouth daily.     rosuvastatin (CRESTOR) 5 MG tablet TAKE 1 TABLET(5 MG) BY MOUTH DAILY AT 6 PM 90 tablet 3   Semaglutide, 1 MG/DOSE, (OZEMPIC, 1 MG/DOSE,) 2 MG/1.5ML SOPN Inject 1 mg into the skin every Monday.  (Patient not taking: Reported on 04/11/2022)     No current facility-administered medications on file prior to visit.    LABS/IMAGING: No results found for this or any previous visit (from the past 48 hour(s)). No results found.  LIPID PANEL:    Component Value Date/Time   CHOL 79 (L) 05/18/2021 1700   TRIG 142 05/18/2021 1700   HDL 46 05/18/2021 1700   CHOLHDL 1.7 05/18/2021 1700   CHOLHDL 2.5 07/10/2016 0919   VLDL 9 07/10/2016 0919   LDLCALC 9 05/18/2021 1700   LDLDIRECT  156 (H) 11/15/2020 1006    WEIGHTS: Wt Readings from Last 3 Encounters:  04/11/22 238 lb 11.2 oz (108.3 kg)  02/13/22 225 lb 6.4 oz (102.2 kg)  10/06/21 225 lb (102.1 kg)    VITALS: BP 118/60   Pulse 65   Ht '5\' 11"'$  (1.803 m)   Wt 238 lb 11.2 oz (108.3 kg)   SpO2 98%   BMI 33.29 kg/m   EXAM: General appearance: alert and no distress Lungs: clear to auscultation bilaterally Heart: regular rate and rhythm Extremities: extremities normal, atraumatic, no cyanosis or edema Neurologic: Grossly normal  EKG: Deferred  ASSESSMENT: Mixed dyslipidemia, goal LDL 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   Mr. Veva Holes continues to do well on PCSK9 inhibitors, now on Repatha.  He just had approval and the medication is affordable for him.  His LDL is 16 and he is tolerating the medicine well.  No changes at this time.  Plan to repeat lipid and follow-up with me annually or sooner if necessary.  Thanks again for the kind referral.  Pixie Casino, MD, FACC, [place] Director of the Advanced Lipid Disorders &  Cardiovascular Risk Reduction Clinic Diplomate of the American Board of Clinical Lipidology Attending Cardiologist  Direct Dial: [phone]  Fax: [phone]  Website:  [email or web address] 04/11/2022, 9:37 AM

## 2022-04-12 ENCOUNTER — Encounter (INDEPENDENT_AMBULATORY_CARE_PROVIDER_SITE_OTHER): Payer: BC Managed Care – PPO | Admitting: Ophthalmology

## 2022-04-12 DIAGNOSIS — I10 Essential (primary) hypertension: Secondary | ICD-10-CM | POA: Diagnosis not present

## 2022-04-12 DIAGNOSIS — H35033 Hypertensive retinopathy, bilateral: Secondary | ICD-10-CM

## 2022-04-12 DIAGNOSIS — E103593 Type 1 diabetes mellitus with proliferative diabetic retinopathy without macular edema, bilateral: Secondary | ICD-10-CM

## 2022-04-12 DIAGNOSIS — H43812 Vitreous degeneration, left eye: Secondary | ICD-10-CM

## 2022-04-22 DIAGNOSIS — G4733 Obstructive sleep apnea (adult) (pediatric): Secondary | ICD-10-CM | POA: Diagnosis not present

## 2022-04-22 DIAGNOSIS — R5383 Other fatigue: Secondary | ICD-10-CM | POA: Diagnosis not present

## 2022-04-22 DIAGNOSIS — I10 Essential (primary) hypertension: Secondary | ICD-10-CM | POA: Diagnosis not present

## 2022-05-16 DIAGNOSIS — J988 Other specified respiratory disorders: Secondary | ICD-10-CM | POA: Diagnosis not present

## 2022-05-16 DIAGNOSIS — R062 Wheezing: Secondary | ICD-10-CM | POA: Diagnosis not present

## 2022-05-23 DIAGNOSIS — G4733 Obstructive sleep apnea (adult) (pediatric): Secondary | ICD-10-CM | POA: Diagnosis not present

## 2022-05-23 DIAGNOSIS — R5383 Other fatigue: Secondary | ICD-10-CM | POA: Diagnosis not present

## 2022-05-23 DIAGNOSIS — I10 Essential (primary) hypertension: Secondary | ICD-10-CM | POA: Diagnosis not present

## 2022-06-05 DIAGNOSIS — D485 Neoplasm of uncertain behavior of skin: Secondary | ICD-10-CM | POA: Diagnosis not present

## 2022-06-19 DIAGNOSIS — Z9641 Presence of insulin pump (external) (internal): Secondary | ICD-10-CM | POA: Diagnosis not present

## 2022-06-19 DIAGNOSIS — E1069 Type 1 diabetes mellitus with other specified complication: Secondary | ICD-10-CM | POA: Diagnosis not present

## 2022-06-19 DIAGNOSIS — Z978 Presence of other specified devices: Secondary | ICD-10-CM | POA: Diagnosis not present

## 2022-06-19 DIAGNOSIS — E108 Type 1 diabetes mellitus with unspecified complications: Secondary | ICD-10-CM | POA: Diagnosis not present

## 2022-06-19 DIAGNOSIS — E782 Mixed hyperlipidemia: Secondary | ICD-10-CM | POA: Diagnosis not present

## 2022-06-28 DIAGNOSIS — M170 Bilateral primary osteoarthritis of knee: Secondary | ICD-10-CM | POA: Diagnosis not present

## 2022-07-17 DIAGNOSIS — I10 Essential (primary) hypertension: Secondary | ICD-10-CM | POA: Diagnosis not present

## 2022-07-17 DIAGNOSIS — G4733 Obstructive sleep apnea (adult) (pediatric): Secondary | ICD-10-CM | POA: Diagnosis not present

## 2022-07-20 ENCOUNTER — Telehealth: Payer: Self-pay | Admitting: Cardiology

## 2022-07-20 MED ORDER — ISOSORBIDE MONONITRATE ER 60 MG PO TB24: 60.0000 mg | ORAL_TABLET | Freq: Every day | ORAL | 1 refills | Status: DC

## 2022-07-20 NOTE — Telephone Encounter (Signed)
*  STAT* If patient is at the pharmacy, call can be transferred to refill team.   1. Which medications need to be refilled? (please list name of each medication and dose if known)   isosorbide mononitrate (IMDUR) 60 MG 24 hr tablet  2. Which pharmacy/location (including street and city if local pharmacy) is medication to be sent to?  [facility] [place], [place]  3. Do they need a 30 day or 90 day supply?  90 day  Patient stated he is completely out of this medication.

## 2022-07-23 DIAGNOSIS — R5383 Other fatigue: Secondary | ICD-10-CM | POA: Diagnosis not present

## 2022-07-23 DIAGNOSIS — G4733 Obstructive sleep apnea (adult) (pediatric): Secondary | ICD-10-CM | POA: Diagnosis not present

## 2022-07-23 DIAGNOSIS — I10 Essential (primary) hypertension: Secondary | ICD-10-CM | POA: Diagnosis not present

## 2022-08-02 DIAGNOSIS — E1037X3 Type 1 diabetes mellitus with diabetic macular edema, resolved following treatment, bilateral: Secondary | ICD-10-CM | POA: Diagnosis not present

## 2022-08-02 DIAGNOSIS — H40022 Open angle with borderline findings, high risk, left eye: Secondary | ICD-10-CM | POA: Diagnosis not present

## 2022-08-02 DIAGNOSIS — H401111 Primary open-angle glaucoma, right eye, mild stage: Secondary | ICD-10-CM | POA: Diagnosis not present

## 2022-08-15 ENCOUNTER — Ambulatory Visit: Payer: BC Managed Care – PPO | Admitting: Cardiology

## 2022-08-16 DIAGNOSIS — I10 Essential (primary) hypertension: Secondary | ICD-10-CM | POA: Diagnosis not present

## 2022-08-16 DIAGNOSIS — G4733 Obstructive sleep apnea (adult) (pediatric): Secondary | ICD-10-CM | POA: Diagnosis not present

## 2022-08-18 DIAGNOSIS — J45901 Unspecified asthma with (acute) exacerbation: Secondary | ICD-10-CM | POA: Diagnosis not present

## 2022-08-18 DIAGNOSIS — R062 Wheezing: Secondary | ICD-10-CM | POA: Diagnosis not present

## 2022-08-23 DIAGNOSIS — R5383 Other fatigue: Secondary | ICD-10-CM | POA: Diagnosis not present

## 2022-08-23 DIAGNOSIS — G4733 Obstructive sleep apnea (adult) (pediatric): Secondary | ICD-10-CM | POA: Diagnosis not present

## 2022-08-23 DIAGNOSIS — I10 Essential (primary) hypertension: Secondary | ICD-10-CM | POA: Diagnosis not present

## 2022-08-31 DIAGNOSIS — E108 Type 1 diabetes mellitus with unspecified complications: Secondary | ICD-10-CM | POA: Diagnosis not present

## 2022-08-31 DIAGNOSIS — E10311 Type 1 diabetes mellitus with unspecified diabetic retinopathy with macular edema: Secondary | ICD-10-CM | POA: Diagnosis not present

## 2022-08-31 DIAGNOSIS — Z9641 Presence of insulin pump (external) (internal): Secondary | ICD-10-CM | POA: Diagnosis not present

## 2022-09-05 ENCOUNTER — Other Ambulatory Visit: Payer: Self-pay | Admitting: Cardiology

## 2022-09-16 DIAGNOSIS — G4733 Obstructive sleep apnea (adult) (pediatric): Secondary | ICD-10-CM | POA: Diagnosis not present

## 2022-09-16 DIAGNOSIS — I10 Essential (primary) hypertension: Secondary | ICD-10-CM | POA: Diagnosis not present

## 2022-09-20 DIAGNOSIS — E108 Type 1 diabetes mellitus with unspecified complications: Secondary | ICD-10-CM | POA: Diagnosis not present

## 2022-09-20 DIAGNOSIS — Z9641 Presence of insulin pump (external) (internal): Secondary | ICD-10-CM | POA: Diagnosis not present

## 2022-09-21 DIAGNOSIS — E108 Type 1 diabetes mellitus with unspecified complications: Secondary | ICD-10-CM | POA: Diagnosis not present

## 2022-09-22 DIAGNOSIS — G4733 Obstructive sleep apnea (adult) (pediatric): Secondary | ICD-10-CM | POA: Diagnosis not present

## 2022-09-22 DIAGNOSIS — I10 Essential (primary) hypertension: Secondary | ICD-10-CM | POA: Diagnosis not present

## 2022-09-22 DIAGNOSIS — R5383 Other fatigue: Secondary | ICD-10-CM | POA: Diagnosis not present

## 2022-09-29 ENCOUNTER — Ambulatory Visit: Payer: BC Managed Care – PPO | Admitting: Cardiology

## 2022-10-09 ENCOUNTER — Other Ambulatory Visit: Payer: Self-pay | Admitting: Internal Medicine

## 2022-10-20 DIAGNOSIS — G4733 Obstructive sleep apnea (adult) (pediatric): Secondary | ICD-10-CM | POA: Diagnosis not present

## 2022-10-20 DIAGNOSIS — I10 Essential (primary) hypertension: Secondary | ICD-10-CM | POA: Diagnosis not present

## 2022-10-22 ENCOUNTER — Other Ambulatory Visit: Payer: Self-pay | Admitting: Internal Medicine

## 2022-10-23 DIAGNOSIS — I10 Essential (primary) hypertension: Secondary | ICD-10-CM | POA: Diagnosis not present

## 2022-10-23 DIAGNOSIS — R5383 Other fatigue: Secondary | ICD-10-CM | POA: Diagnosis not present

## 2022-10-23 DIAGNOSIS — G4733 Obstructive sleep apnea (adult) (pediatric): Secondary | ICD-10-CM | POA: Diagnosis not present

## 2022-10-30 ENCOUNTER — Encounter (INDEPENDENT_AMBULATORY_CARE_PROVIDER_SITE_OTHER): Payer: BC Managed Care – PPO | Admitting: Ophthalmology

## 2022-10-30 DIAGNOSIS — H35033 Hypertensive retinopathy, bilateral: Secondary | ICD-10-CM | POA: Diagnosis not present

## 2022-10-30 DIAGNOSIS — E103593 Type 1 diabetes mellitus with proliferative diabetic retinopathy without macular edema, bilateral: Secondary | ICD-10-CM

## 2022-10-30 DIAGNOSIS — I10 Essential (primary) hypertension: Secondary | ICD-10-CM

## 2022-10-30 DIAGNOSIS — H43813 Vitreous degeneration, bilateral: Secondary | ICD-10-CM | POA: Diagnosis not present

## 2022-11-12 NOTE — Progress Notes (Signed)
11/12/2022 TRISHA MORANDI [id number] 01/11/1966   Chief Complaint: Diarrhea   History of Present Illness: Jeanett Schlein. [place] is a 57 year old male with a past medical history of hypertension, hyperlipidemia, coronary artery disease s/p STEMI and DES x 3, diabetes mellitus type I on insulin pump, GERD and colon polyps.  He presents today for further evaluation regarding diarrhea with abdominal bloat which started about 9 months ago which has progressively worsened.  His diarrhea is interfering with his quality of living.  He passes 10-12 nonbloody watery to mud like diarrhea bowel movements daily which includes a few episodes during the night.  No abdominal pain.  He last took an antibiotic for bronchitis 3 to 4 months ago.  Ozempic was discontinued 1 year ago.  Stress level is less now when compared to 1 year ago.  He eats [place] yogurt and cottage cheese a few times weekly otherwise limited dairy intake.  He does not use artificial sweeteners.  He drinks two to three 16 ounce cups of caffeinated coffee daily.  He took Metamucil for a few weeks without improvement.  He cannot recall the last time he passes solid stool.  He does not take Imodium.  He often passes a diarrhea bowel movement within 45 minutes of eating any food.  He skipped breakfast this morning and already passed 3 loose stools.  No weight loss.  No fever, sweats or chills.  He has a history of GERD for which he takes Prilosec 20 mg daily for the past 3 years.  No current reflux symptoms or dysphagia.  He underwent a colonoscopy 03/02/2021 which identified 3 small tubular adenomatous polyps removed from the cecum and ascending colon.  He was advised to repeat a colonoscopy in 3 years.  No biological family history of IBD or colorectal cancer.  Joslyn Hy has Crohn's disease.     Latest Ref Rng & Units 06/24/2021    1:52 PM 05/18/2021    5:01 PM 10/13/2019   10:17 AM  CBC  WBC 3.4 - 10.8 x10E3/uL 6.7  8.2  9.0   Hemoglobin 13.0 - 17.7 g/dL  14.8  14.3  15.4   Hematocrit 37.5 - 51.0 % 42.9  42.9  45.7   Platelets 150 - 450 x10E3/uL 299  326  344        Latest Ref Rng & Units 06/24/2021    1:52 PM 05/16/2021   10:31 AM 10/13/2019   10:17 AM  CMP  Glucose 65 - 99 mg/dL 145  95  256   BUN 6 - 24 mg/dL '12  10  13   '$ Creatinine 0.76 - 1.27 mg/dL 0.94  0.90  0.78   Sodium 134 - 144 mmol/L 141  142  137   Potassium 3.5 - 5.2 mmol/L 3.8  4.2  4.6   Chloride 96 - 106 mmol/L 105  106  103   CO2 20 - 29 mmol/L '22  22  23   '$ Calcium 8.7 - 10.2 mg/dL 9.3  9.5  9.4   Total Protein 6.0 - 8.5 g/dL  6.5    Total Bilirubin 0.0 - 1.2 mg/dL  0.4    Alkaline Phos 44 - 121 IU/L  69    AST 0 - 40 IU/L  37    ALT 0 - 44 IU/L  42      Colonoscopy 03/02/2021: Three 1 to 2 mm tubular adenomatous polyps removed from the ascending colon and cecum Nonbleeding external and internal hemorrhoids Recall  colonoscopy 3 years  Past Medical History:  Diagnosis Date   Coronary artery disease    s/p NSTEMI after ETT-echo - [facility] 7/13:  LM < 10%, pLAD 90%, mLAD 95%, pCFX tandem 90%, mCFX 80-90%, pRCA 30%, mRCA 40-50%;  PCI: Xience DES x 2 to prox and mid LAD; Xience DES x 1 to prox and mid CFX   Family history of anesthesia complication    nausea    GERD (gastroesophageal reflux disease)    HLD (hyperlipidemia)    Hypertension    Lung nodule 2013   CT 7/13: 3 mm RML nodule - felt to be stable and benign/ follow up in 2014   Myocardial infarction [facility]) 2013   have 4 stents   S/P coronary artery stent placement    Type I diabetes mellitus (HCC)    insulin dependent   Past Surgical History:  Procedure Laterality Date   25 GAUGE PARS PLANA VITRECTOMY WITH 20 GAUGE MVR PORT FOR MACULAR HOLE Right 08/01/2016   25 GAUGE PARS PLANA VITRECTOMY WITH 20 GAUGE MVR PORT FOR MACULAR HOLE, MEMBRANE PEEL AND SERUM PATCH WITH ENDOLASER AND C3F8    25 GAUGE PARS PLANA VITRECTOMY WITH 20 GAUGE MVR PORT FOR MACULAR HOLE Right 08/01/2016   Procedure: 25 GAUGE PARS PLANA  VITRECTOMY WITH 20 GAUGE MVR PORT FOR MACULAR HOLE, MEMBRANE PEEL AND SERUM PATCH WITH ENDOLASER AND C3F8;  Surgeon: Hayden Pedro, MD;  Location: [place];  Service: Ophthalmology;  Laterality: Right;   APPENDECTOMY  1980's   CATARACT EXTRACTION  07/2017   right eye   CHOLECYSTECTOMY  ~ 2006   COLONOSCOPY     CORONARY ANGIOPLASTY WITH STENT PLACEMENT  04/2013   LAD   GAS/FLUID EXCHANGE Right 09/16/2013   Procedure: AIR/FLUID EXCHANGE;  Surgeon: Hayden Pedro, MD;  Location: [place];  Service: Ophthalmology;  Laterality: Right;   GAS/FLUID EXCHANGE Right 08/01/2016   Procedure: GAS/FLUID EXCHANGE;  Surgeon: Hayden Pedro, MD;  Location: [place];  Service: Ophthalmology;  Laterality: Right;   KNEE ARTHROSCOPY Right ~ 1984   LEFT HEART CATH AND CORONARY ANGIOGRAPHY N/A 10/14/2019   Procedure: LEFT HEART CATH AND CORONARY ANGIOGRAPHY;  Surgeon: Martinique, Peter M, MD;  Location: [place] CV LAB;  Service: Cardiovascular;  Laterality: N/A;   LEFT HEART CATH AND CORONARY ANGIOGRAPHY N/A 07/01/2021   Procedure: LEFT HEART CATH AND CORONARY ANGIOGRAPHY;  Surgeon: Burnell Blanks, MD;  Location: [place] CV LAB;  Service: Cardiovascular;  Laterality: N/A;   LEFT HEART CATHETERIZATION WITH CORONARY ANGIOGRAM N/A 05/03/2012   Procedure: LEFT HEART CATHETERIZATION WITH CORONARY ANGIOGRAM;  Surgeon: Peter M Martinique, MD;  Location: [facility] CATH LAB;  Service: Cardiovascular;  Laterality: N/A;   MEMBRANE PEEL Right 09/16/2013   Procedure: MEMBRANE PEEL;  Surgeon: Hayden Pedro, MD;  Location: [place];  Service: Ophthalmology;  Laterality: Right;   PARS PLANA VITRECTOMY Right 09/16/2013   w/membrane peel/notes 11/25/20145   PARS PLANA VITRECTOMY Right 09/16/2013   Procedure: PARS PLANA VITRECTOMY WITH 25 GAUGE;  Surgeon: Hayden Pedro, MD;  Location: [place];  Service: Ophthalmology;  Laterality: Right;   PHOTOCOAGULATION WITH LASER Right 09/16/2013   Procedure: PHOTOCOAGULATION WITH LASER;  Surgeon: Hayden Pedro, MD;  Location: [place];  Service: Ophthalmology;  Laterality: Right;  ENDOLASER   POLYPECTOMY     SERUM PATCH Right 08/01/2016   Procedure: SERUM PATCH;  Surgeon: Hayden Pedro, MD;  Location: [place];  Service: Ophthalmology;  Laterality: Right;  TONSILLECTOMY AND ADENOIDECTOMY  1970's   Current Outpatient Medications on File Prior to Visit  Medication Sig Dispense Refill   ACCU-CHEK AVIVA PLUS test strip 1 each by Other route as needed.      ADVAIR DISKUS 250-50 MCG/DOSE AEPB Inhale 1 puff into the lungs daily.      albuterol (PROVENTIL HFA;VENTOLIN HFA) 108 (90 Base) MCG/ACT inhaler Inhale 1 puff into the lungs every 6 (six) hours as needed for wheezing or shortness of breath.     aspirin 81 MG tablet Take 81 mg by mouth daily.     azelastine (ASTELIN) 0.1 % nasal spray Place 1 spray into the nose daily.      Coenzyme Q10 100 MG capsule Take by mouth.     Continuous Blood Gluc Sensor (DEXCOM G6 SENSOR) MISC      Evolocumab (REPATHA SURECLICK) 291 MG/ML SOAJ Inject 1 Dose into the skin every 14 (fourteen) days. 6 mL 3   fluticasone (FLONASE) 50 MCG/ACT nasal spray Place 1 spray into both nostrils daily.     insulin aspart (NOVOLOG FLEXPEN) 100 UNIT/ML FlexPen To be inject 3-4 times daily for meals or snacks at directed, up to 100 units daily.     isosorbide mononitrate (IMDUR) 60 MG 24 hr tablet TAKE 1 TABLET(60 MG) BY MOUTH DAILY 90 tablet 3   losartan (COZAAR) 25 MG tablet Take 1 tablet (25 mg total) by mouth daily. 90 tablet 1   Multiple Vitamins-Minerals (AIRBORNE GUMMIES) CHEW Chew 3 capsules by mouth daily.     nitroGLYCERIN (NITROSTAT) 0.4 MG SL tablet PLACE 1 TABLET UNDER THE TONGUE EVERY 5 MINUTES AS NEEDED FOR CHEST PAIN AS DIRECTED 25 tablet 1   omeprazole (PRILOSEC) 20 MG capsule Take 20 mg by mouth daily.     rosuvastatin (CRESTOR) 5 MG tablet TAKE 1 TABLET(5 MG) BY MOUTH DAILY AT 6 PM 90 tablet 3   Semaglutide, 1 MG/DOSE, (OZEMPIC, 1 MG/DOSE,) 2 MG/1.5ML SOPN Inject  1 mg into the skin every Monday.     insulin degludec (TRESIBA) 100 UNIT/ML FlexTouch Pen Inject 38 Units into the skin every morning.     No current facility-administered medications on file prior to visit.   No Known Allergies  Current Medications, Allergies, Past Medical History, Past Surgical History, Family History and Social History were reviewed in [employer] record.  Review of Systems:   Constitutional: Decreased energy level. Negative for fever, sweats, chills or weight loss.  Respiratory: Negative for shortness of breath.   Cardiovascular: Negative for chest pain, palpitations and leg swelling.  Gastrointestinal: See HPI.  Musculoskeletal: Negative for back pain or muscle aches.  Neurological: Negative for dizziness, headaches or paresthesias.   Physical Exam: BP 132/78   Pulse 84   Ht '5\' 11"'$  (1.803 m)   Wt 243 lb 2 oz (110.3 kg)   BMI 33.91 kg/m   General: 57 year old male in no acute distress. Head: Normocephalic and atraumatic. Eyes: No scleral icterus. Conjunctiva pink . Ears: Normal auditory acuity. Mouth: Dentition intact. No ulcers or lesions.  Lungs: Clear throughout to auscultation. Heart: Regular rate and rhythm, no murmur. Abdomen: Soft, nontender and nondistended. No masses or hepatomegaly. Normal bowel sounds x 4 quadrants.  Rectal: Deferred. Musculoskeletal: Symmetrical with no gross deformities. Extremities: No edema. Neurological: Alert oriented x 4. No focal deficits.  Psychological: Alert and cooperative. Normal mood and affect  Assessment and Recommendations:  66) 57 year old male with nonbloody diarrhea associated abdominal bloat x 9 months -GI pathogen panel  including C. difficile PCR -Pancreatic elastase level to rule out pancreatic insufficiency -CBC, CMP, TSH, CRP, IgA and TTG -SIBO breath test versus empiric treatment with Xifaxan if the above lab results unrevealing -CTAP if pancreatic insufficiency  identified -Patient instructed to decrease coffee to 1 cup daily -In 1 week, may try Benefiber 1 tablespoon daily -Diagnostic colonoscopy to rule out microscopic colitis versus IBD if diarrhea persists or worsens -Patient to contact office if symptoms worsen -Further recommendation to be determined after the above lab results reviewed  2) History of tubular adenomatous polyps per colonoscopy 02/2021  3) DM type I on an insulin pump  4) History of CAD s/p MI s/p multiple stents. On ASA, Rosuvastatin, Imdur and Repatha. No angina.

## 2022-11-13 ENCOUNTER — Encounter: Payer: Self-pay | Admitting: Nurse Practitioner

## 2022-11-13 ENCOUNTER — Ambulatory Visit: Payer: BC Managed Care – PPO | Admitting: Nurse Practitioner

## 2022-11-13 ENCOUNTER — Other Ambulatory Visit (INDEPENDENT_AMBULATORY_CARE_PROVIDER_SITE_OTHER): Payer: BC Managed Care – PPO

## 2022-11-13 VITALS — BP 132/78 | HR 84 | Ht 71.0 in | Wt 243.1 lb

## 2022-11-13 DIAGNOSIS — R197 Diarrhea, unspecified: Secondary | ICD-10-CM

## 2022-11-13 DIAGNOSIS — R140 Abdominal distension (gaseous): Secondary | ICD-10-CM | POA: Diagnosis not present

## 2022-11-13 DIAGNOSIS — E109 Type 1 diabetes mellitus without complications: Secondary | ICD-10-CM

## 2022-11-13 DIAGNOSIS — M25571 Pain in right ankle and joints of right foot: Secondary | ICD-10-CM | POA: Diagnosis not present

## 2022-11-13 LAB — CBC WITH DIFFERENTIAL/PLATELET
Basophils Absolute: 0.1 10*3/uL (ref 0.0–0.1)
Basophils Relative: 1.3 % (ref 0.0–3.0)
Eosinophils Absolute: 0.5 10*3/uL (ref 0.0–0.7)
Eosinophils Relative: 6.4 % — ABNORMAL HIGH (ref 0.0–5.0)
HCT: 46.3 % (ref 39.0–52.0)
Hemoglobin: 15.7 g/dL (ref 13.0–17.0)
Lymphocytes Relative: 22.6 % (ref 12.0–46.0)
Lymphs Abs: 1.7 10*3/uL (ref 0.7–4.0)
MCHC: 33.9 g/dL (ref 30.0–36.0)
MCV: 93.5 fl (ref 78.0–100.0)
Monocytes Absolute: 0.8 10*3/uL (ref 0.1–1.0)
Monocytes Relative: 9.9 % (ref 3.0–12.0)
Neutro Abs: 4.6 10*3/uL (ref 1.4–7.7)
Neutrophils Relative %: 59.8 % (ref 43.0–77.0)
Platelets: 359 10*3/uL (ref 150.0–400.0)
RBC: 4.95 Mil/uL (ref 4.22–5.81)
RDW: 13.9 % (ref 11.5–15.5)
WBC: 7.7 10*3/uL (ref 4.0–10.5)

## 2022-11-13 LAB — C-REACTIVE PROTEIN: CRP: <1 mg/dL (ref 0.5–20.0)

## 2022-11-13 LAB — COMPREHENSIVE METABOLIC PANEL
ALT: 29 U/L (ref 0–53)
AST: 25 U/L (ref 0–37)
Albumin: 4.3 g/dL (ref 3.5–5.2)
Alkaline Phosphatase: 47 U/L (ref 39–117)
BUN: 14 mg/dL (ref 6–23)
CO2: 26 mEq/L (ref 19–32)
Calcium: 9.5 mg/dL (ref 8.4–10.5)
Chloride: 102 mEq/L (ref 96–112)
Creatinine, Ser: 0.91 mg/dL (ref 0.40–1.50)
GFR: 93.51 mL/min (ref >60.00)
Glucose, Bld: 170 mg/dL — ABNORMAL HIGH (ref 70–99)
Potassium: 4.2 mEq/L (ref 3.5–5.1)
Sodium: 136 mEq/L (ref 135–145)
Total Bilirubin: 0.7 mg/dL (ref 0.2–1.2)
Total Protein: 7.4 g/dL (ref 6.0–8.3)

## 2022-11-13 LAB — TSH: TSH: 3.08 u[IU]/mL (ref 0.35–5.50)

## 2022-11-13 NOTE — Patient Instructions (Addendum)
Your provider has requested that you go to the basement level for lab work before leaving today. Press "B" on the elevator. The lab is located at the first door on the left as you exit the elevator.  Reduce coffee intake to 1 cup daily.  If no improvement in 1 week, Add Benfiber- 1 tablespoon a day  Contact our office if symptoms worsens.  Due to recent changes in healthcare laws, you may see the results of your imaging and laboratory studies on MyChart before your provider has had a chance to review them.  We understand that in some cases there may be results that are confusing or concerning to you. Not all laboratory results come back in the same time frame and the provider may be waiting for multiple results in order to interpret others.  Please give [place] 48 hours in order for your provider to thoroughly review all the results before contacting the office for clarification of your results.    Thank you for trusting me with your gastrointestinal care!   Carl Best, CRNP

## 2022-11-14 ENCOUNTER — Other Ambulatory Visit: Payer: Self-pay | Admitting: Nurse Practitioner

## 2022-11-14 DIAGNOSIS — G4733 Obstructive sleep apnea (adult) (pediatric): Secondary | ICD-10-CM | POA: Diagnosis not present

## 2022-11-14 LAB — GI PROFILE, STOOL, PCR

## 2022-11-14 LAB — IGA: Immunoglobulin A: 185 mg/dL (ref 47–310)

## 2022-11-14 LAB — TISSUE TRANSGLUTAMINASE ABS,IGG,IGA
(tTG) Ab, IgA: <1 U/mL
(tTG) Ab, IgG: <1 U/mL

## 2022-11-14 MED ORDER — VANCOMYCIN HCL 125 MG PO CAPS: 125.0000 mg | ORAL_CAPSULE | Freq: Four times a day (QID) | ORAL | 0 refills | Status: AC

## 2022-11-19 DIAGNOSIS — J45901 Unspecified asthma with (acute) exacerbation: Secondary | ICD-10-CM | POA: Diagnosis not present

## 2022-11-20 LAB — PANCREATIC ELASTASE, FECAL: Pancreatic Elastase-1, Stool: 364 mcg/g

## 2022-11-23 DIAGNOSIS — G4733 Obstructive sleep apnea (adult) (pediatric): Secondary | ICD-10-CM | POA: Diagnosis not present

## 2022-12-01 ENCOUNTER — Ambulatory Visit: Payer: BC Managed Care – PPO | Admitting: Cardiology

## 2022-12-02 ENCOUNTER — Other Ambulatory Visit: Payer: Self-pay | Admitting: Internal Medicine

## 2022-12-04 DIAGNOSIS — Z1330 Encounter for screening examination for mental health and behavioral disorders, unspecified: Secondary | ICD-10-CM | POA: Diagnosis not present

## 2022-12-04 DIAGNOSIS — E108 Type 1 diabetes mellitus with unspecified complications: Secondary | ICD-10-CM | POA: Diagnosis not present

## 2022-12-04 DIAGNOSIS — Z9641 Presence of insulin pump (external) (internal): Secondary | ICD-10-CM | POA: Diagnosis not present

## 2022-12-19 ENCOUNTER — Ambulatory Visit: Payer: BC Managed Care – PPO | Admitting: Pulmonary Disease

## 2022-12-19 ENCOUNTER — Encounter: Payer: Self-pay | Admitting: Pulmonary Disease

## 2022-12-19 ENCOUNTER — Ambulatory Visit (INDEPENDENT_AMBULATORY_CARE_PROVIDER_SITE_OTHER): Payer: BC Managed Care – PPO

## 2022-12-19 VITALS — BP 122/62 | HR 66 | Ht 71.0 in | Wt 234.0 lb

## 2022-12-19 DIAGNOSIS — R059 Cough, unspecified: Secondary | ICD-10-CM | POA: Diagnosis not present

## 2022-12-19 DIAGNOSIS — R051 Acute cough: Secondary | ICD-10-CM

## 2022-12-19 MED ORDER — PREDNISONE 20 MG PO TABS: ORAL_TABLET | ORAL | 0 refills | Status: DC

## 2022-12-19 MED ORDER — DOXYCYCLINE HYCLATE 100 MG PO TABS: 100.0000 mg | ORAL_TABLET | Freq: Two times a day (BID) | ORAL | 0 refills | Status: DC

## 2022-12-19 MED ORDER — TRELEGY ELLIPTA 100-62.5-25 MCG/ACT IN AEPB: 1.0000 | INHALATION_SPRAY | Freq: Every day | RESPIRATORY_TRACT | 3 refills | Status: DC

## 2022-12-19 NOTE — Patient Instructions (Signed)
I will see you back in about 4 months  Switch from Advair to Trelegy  Use albuterol as needed  Continue regular exercises  Will give you prescription for doxycycline and steroids to treat this bronchitis  Chest x-ray today  Call us with significant concerns

## 2022-12-19 NOTE — Progress Notes (Signed)
[name]    [id number]    05-May-1966  Primary Care Physician:Whyte, Gildardo Griffes, MD  Referring Physician: Maris Berger, MD [address] [place],  [place] [address]  Chief complaint:   Shortness of breath on exertion  HPI:  Has been doing very relatively well since the last visit  Currently has a cough with minimal sputum production  Does not feel he has an overwhelming illness at the present time but definitely breathing is worse than baseline He is compliant with Advair , Does use albuterol as needed Has had bouts of bronchitis over the years He feels he gets bronchitis a few times a year  Recent workup for shortness of breath was entirely negative Cardiac work-up negative Echocardiogram within normal limits Pulmonary function test today within normal limits Chest x-ray within normal limits  There was concern for obstructive sleep apnea, had a home sleep study performed which showed mild obstructive sleep apnea  Sugar was uncontrolled but was started on insulin pump recently which coincided with onset of improvement in symptoms  Never smoker No pertinent occupational history  Usually goes to bed between 11 and 12 Falls asleep soon after Few awakenings Final wake up time about 6 AM  Admits to snoring, daytime sleepiness  Overall breathing is getting better Cough is better   Outpatient Encounter Medications as of 12/19/2022  Medication Sig   ACCU-CHEK AVIVA PLUS test strip 1 each by Other route as needed.    ADVAIR DISKUS 250-50 MCG/DOSE AEPB Inhale 1 puff into the lungs daily.    albuterol (PROVENTIL HFA;VENTOLIN HFA) 108 (90 Base) MCG/ACT inhaler Inhale 1 puff into the lungs every 6 (six) hours as needed for wheezing or shortness of breath.   aspirin 81 MG tablet Take 81 mg by mouth daily.   azelastine (ASTELIN) 0.1 % nasal spray Place 1 spray into the nose daily.    benzonatate (TESSALON) 100 MG capsule    Coenzyme Q10 100 MG  capsule Take by mouth.   Continuous Blood Gluc Sensor (DEXCOM G6 SENSOR) MISC    Evolocumab (REPATHA SURECLICK) [id number] MG/ML SOAJ Inject 1 Dose into the skin every 14 (fourteen) days.   fluticasone (FLONASE) 50 MCG/ACT nasal spray Place 1 spray into both nostrils daily.   insulin aspart (NOVOLOG FLEXPEN) 100 UNIT/ML FlexPen To be inject 3-4 times daily for meals or snacks at directed, up to 100 units daily.   isosorbide mononitrate (IMDUR) 60 MG 24 hr tablet TAKE 1 TABLET(60 MG) BY MOUTH DAILY   losartan (COZAAR) 25 MG tablet TAKE 1 TABLET(25 MG) BY MOUTH DAILY   Multiple Vitamins-Minerals (AIRBORNE GUMMIES) CHEW Chew 3 capsules by mouth daily.   nitroGLYCERIN (NITROSTAT) 0.4 MG SL tablet PLACE 1 TABLET UNDER THE TONGUE EVERY 5 MINUTES AS NEEDED FOR CHEST PAIN AS DIRECTED   omeprazole (PRILOSEC) 20 MG capsule Take 20 mg by mouth daily.   rosuvastatin (CRESTOR) 5 MG tablet TAKE 1 TABLET(5 MG) BY MOUTH DAILY AT 6 PM   Semaglutide, 1 MG/DOSE, (OZEMPIC, 1 MG/DOSE,) 2 MG/1.5ML SOPN Inject 1 mg into the skin every Monday.   insulin degludec (TRESIBA) 100 UNIT/ML FlexTouch Pen Inject 38 Units into the skin every morning.   No facility-administered encounter medications on file as of 12/19/2022.    Allergies as of 12/19/2022   (No Known Allergies)    Past Medical History:  Diagnosis Date   Coronary artery disease    s/p NSTEMI after ETT-echo -  LHC 7/13:  LM < 10%, pLAD 90%, mLAD 95%, pCFX tandem 90%, mCFX 80-90%, pRCA 30%, mRCA 40-50%;  PCI: Xience DES x 2 to prox and mid LAD; Xience DES x 1 to prox and mid CFX   Family history of anesthesia complication    nausea    GERD (gastroesophageal reflux disease)    HLD (hyperlipidemia)    Hypertension    Lung nodule 2013   CT 7/13: 3 mm RML nodule - felt to be stable and benign/ follow up in 2014   Myocardial infarction [facility]) 2013   have 4 stents   S/P coronary artery stent placement    Type I diabetes mellitus (HCC)    insulin dependent    Past  Surgical History:  Procedure Laterality Date   25 GAUGE PARS PLANA VITRECTOMY WITH 20 GAUGE MVR PORT FOR MACULAR HOLE Right 08/01/2016   25 GAUGE PARS PLANA VITRECTOMY WITH 20 GAUGE MVR PORT FOR MACULAR HOLE, MEMBRANE PEEL AND SERUM PATCH WITH ENDOLASER AND C3F8    25 GAUGE PARS PLANA VITRECTOMY WITH 20 GAUGE MVR PORT FOR MACULAR HOLE Right 08/01/2016   Procedure: 25 GAUGE PARS PLANA VITRECTOMY WITH 20 GAUGE MVR PORT FOR MACULAR HOLE, MEMBRANE PEEL AND SERUM PATCH WITH ENDOLASER AND C3F8;  Surgeon: Hayden Pedro, MD;  Location: [place];  Service: Ophthalmology;  Laterality: Right;   APPENDECTOMY  1980's   CATARACT EXTRACTION  07/2017   right eye   CHOLECYSTECTOMY  ~ 2006   COLONOSCOPY     CORONARY ANGIOPLASTY WITH STENT PLACEMENT  04/2013   LAD   GAS/FLUID EXCHANGE Right 09/16/2013   Procedure: AIR/FLUID EXCHANGE;  Surgeon: Hayden Pedro, MD;  Location: [place];  Service: Ophthalmology;  Laterality: Right;   GAS/FLUID EXCHANGE Right 08/01/2016   Procedure: GAS/FLUID EXCHANGE;  Surgeon: Hayden Pedro, MD;  Location: [place];  Service: Ophthalmology;  Laterality: Right;   KNEE ARTHROSCOPY Right ~ 1984   LEFT HEART CATH AND CORONARY ANGIOGRAPHY N/A 10/14/2019   Procedure: LEFT HEART CATH AND CORONARY ANGIOGRAPHY;  Surgeon: Martinique, Peter M, MD;  Location: [place] CV LAB;  Service: Cardiovascular;  Laterality: N/A;   LEFT HEART CATH AND CORONARY ANGIOGRAPHY N/A 07/01/2021   Procedure: LEFT HEART CATH AND CORONARY ANGIOGRAPHY;  Surgeon: Burnell Blanks, MD;  Location: [place] CV LAB;  Service: Cardiovascular;  Laterality: N/A;   LEFT HEART CATHETERIZATION WITH CORONARY ANGIOGRAM N/A 05/03/2012   Procedure: LEFT HEART CATHETERIZATION WITH CORONARY ANGIOGRAM;  Surgeon: Peter M Martinique, MD;  Location: [facility] CATH LAB;  Service: Cardiovascular;  Laterality: N/A;   MEMBRANE PEEL Right 09/16/2013   Procedure: MEMBRANE PEEL;  Surgeon: Hayden Pedro, MD;  Location: [place];  Service: Ophthalmology;   Laterality: Right;   PARS PLANA VITRECTOMY Right 09/16/2013   w/membrane peel/notes 11/25/20145   PARS PLANA VITRECTOMY Right 09/16/2013   Procedure: PARS PLANA VITRECTOMY WITH 25 GAUGE;  Surgeon: Hayden Pedro, MD;  Location: [place];  Service: Ophthalmology;  Laterality: Right;   PHOTOCOAGULATION WITH LASER Right 09/16/2013   Procedure: PHOTOCOAGULATION WITH LASER;  Surgeon: Hayden Pedro, MD;  Location: [place];  Service: Ophthalmology;  Laterality: Right;  ENDOLASER   POLYPECTOMY     SERUM PATCH Right 08/01/2016   Procedure: SERUM PATCH;  Surgeon: Hayden Pedro, MD;  Location: [place];  Service: Ophthalmology;  Laterality: Right;   TONSILLECTOMY AND ADENOIDECTOMY  43's    Family History  Problem Relation Age of Onset   Retinitis pigmentosa Father  Coronary artery disease Brother 28       Died   Heart attack Brother    Heart disease Brother    Hypertension Mother    Coronary artery disease Maternal Uncle 16       Died   Coronary artery disease Maternal Grandmother 15       Died   Aneurysm Maternal Grandfather 85       Abdominal   Aneurysm Daughter 16       Cerebral   Colon polyps Neg Hx    Colon cancer Neg Hx    Esophageal cancer Neg Hx    Stomach cancer Neg Hx    Rectal cancer Neg Hx     Social History   Socioeconomic History   Marital status: Married    Spouse name: Not on file   Number of children: 1   Years of education: Not on file   Highest education level: Not on file  Occupational History   Occupation: Furniture buisiness  Tobacco Use   Smoking status: Never   Smokeless tobacco: Never  Vaping Use   Vaping Use: Never used  Substance and Sexual Activity   Alcohol use: Yes    Comment: rare   Drug use: No   Sexual activity: Yes  Other Topics Concern   Not on file  Social History Narrative   Lives with wife and 3 step children.   Social Determinants of Health   Financial Resource Strain: Not on file  Food Insecurity: Not on file   Transportation Needs: Not on file  Physical Activity: Not on file  Stress: Not on file  Social Connections: Not on file  Intimate Partner Violence: Not on file    Review of Systems  Constitutional:  Positive for fatigue.  Respiratory:  Positive for apnea, cough and shortness of breath.   Psychiatric/Behavioral:  Positive for sleep disturbance.     Vitals:   12/19/22 0836  BP: 122/62  Pulse: 66  SpO2: 96%      Physical Exam Constitutional:      Appearance: He is obese.  HENT:     Head: Normocephalic.     Nose: Nose normal.     Mouth/Throat:     Mouth: Mucous membranes are moist.     Comments: Mallampati 3, crowded oropharynx Eyes:     Pupils: Pupils are equal, round, and reactive to light.  Cardiovascular:     Rate and Rhythm: Normal rate and regular rhythm.     Heart sounds: No murmur heard.    No friction rub.  Pulmonary:     Effort: Pulmonary effort is normal. No respiratory distress.     Breath sounds: No stridor. No wheezing or rhonchi.  Musculoskeletal:     Cervical back: No rigidity or tenderness.  Neurological:     Mental Status: He is alert.  Psychiatric:        Mood and Affect: Mood normal.      08/19/2021    9:00 AM  Results of the Epworth flowsheet  Sitting and reading 3  Watching TV 3  Sitting, inactive in a public place (e.g. a theatre or a meeting) 0  As a passenger in a car for an hour without a break 0  Lying down to rest in the afternoon when circumstances permit 3  Sitting and talking to someone 2  Sitting quietly after a lunch without alcohol 3  In a car, while stopped for a few minutes in traffic 0  Total score 14  Data Reviewed: Recent sleep study performed showing mild obstructive sleep apnea  Recent echocardiogram within normal limits with some LVH  Pulmonary function test reviewed with the patient was within normal limits  Chest x-ray from last visit was normal  Assessment:  Excessive daytime  sleepiness -Better  Mild obstructive sleep apnea with excessive daytime sleepiness  With having frequent bronchitis will escalate from Advair to Trelegy  Shortness of breath on exertion -Symptoms are improving currently  Reports he never really had a history of asthma -We are going to try being off Advair and try and use albuterol as needed  Diabetes  Deconditioning  History of coronary artery disease  Plan/Recommendations: Graded exercise as tolerated -Importance of exercises discussed  Prescription for Trelegy sent in Albuterol as needed  Follow-up with cardiology regarding his CPAP  Encourage weight loss efforts  Follow-up in about 4 months   Sherrilyn Rist MD [facility] 12/19/2022, 8:59 AM  CC: Maris Berger, MD

## 2022-12-21 DIAGNOSIS — G4733 Obstructive sleep apnea (adult) (pediatric): Secondary | ICD-10-CM | POA: Diagnosis not present

## 2022-12-21 DIAGNOSIS — I10 Essential (primary) hypertension: Secondary | ICD-10-CM | POA: Diagnosis not present

## 2023-01-02 DIAGNOSIS — R062 Wheezing: Secondary | ICD-10-CM | POA: Diagnosis not present

## 2023-01-02 DIAGNOSIS — J988 Other specified respiratory disorders: Secondary | ICD-10-CM | POA: Diagnosis not present

## 2023-01-05 ENCOUNTER — Other Ambulatory Visit: Payer: Self-pay | Admitting: Internal Medicine

## 2023-01-08 DIAGNOSIS — E108 Type 1 diabetes mellitus with unspecified complications: Secondary | ICD-10-CM | POA: Diagnosis not present

## 2023-01-11 ENCOUNTER — Encounter: Payer: Self-pay | Admitting: Cardiology

## 2023-01-17 ENCOUNTER — Telehealth: Payer: Self-pay | Admitting: Internal Medicine

## 2023-01-17 NOTE — Telephone Encounter (Signed)
PA for repatha sureclick submitted via CMM (Key: [id number])

## 2023-01-18 DIAGNOSIS — I10 Essential (primary) hypertension: Secondary | ICD-10-CM | POA: Diagnosis not present

## 2023-01-18 DIAGNOSIS — G4733 Obstructive sleep apnea (adult) (pediatric): Secondary | ICD-10-CM | POA: Diagnosis not present

## 2023-01-18 NOTE — Telephone Encounter (Signed)
PA approved through 01/17/24

## 2023-01-19 DIAGNOSIS — G4733 Obstructive sleep apnea (adult) (pediatric): Secondary | ICD-10-CM | POA: Diagnosis not present

## 2023-01-19 DIAGNOSIS — I10 Essential (primary) hypertension: Secondary | ICD-10-CM | POA: Diagnosis not present

## 2023-02-06 ENCOUNTER — Other Ambulatory Visit: Payer: Self-pay | Admitting: Internal Medicine

## 2023-02-07 ENCOUNTER — Ambulatory Visit: Payer: BC Managed Care – PPO | Admitting: Cardiology

## 2023-02-17 DIAGNOSIS — J4531 Mild persistent asthma with (acute) exacerbation: Secondary | ICD-10-CM | POA: Diagnosis not present

## 2023-02-18 DIAGNOSIS — I10 Essential (primary) hypertension: Secondary | ICD-10-CM | POA: Diagnosis not present

## 2023-02-18 DIAGNOSIS — G4733 Obstructive sleep apnea (adult) (pediatric): Secondary | ICD-10-CM | POA: Diagnosis not present

## 2023-02-21 DIAGNOSIS — R062 Wheezing: Secondary | ICD-10-CM | POA: Diagnosis not present

## 2023-02-21 DIAGNOSIS — R051 Acute cough: Secondary | ICD-10-CM | POA: Diagnosis not present

## 2023-02-21 DIAGNOSIS — R918 Other nonspecific abnormal finding of lung field: Secondary | ICD-10-CM | POA: Diagnosis not present

## 2023-02-21 DIAGNOSIS — J189 Pneumonia, unspecified organism: Secondary | ICD-10-CM | POA: Diagnosis not present

## 2023-02-21 DIAGNOSIS — R059 Cough, unspecified: Secondary | ICD-10-CM | POA: Diagnosis not present

## 2023-02-22 ENCOUNTER — Other Ambulatory Visit: Payer: Self-pay | Admitting: *Deleted

## 2023-02-22 DIAGNOSIS — E785 Hyperlipidemia, unspecified: Secondary | ICD-10-CM

## 2023-03-07 ENCOUNTER — Other Ambulatory Visit: Payer: Self-pay | Admitting: Cardiology

## 2023-03-09 ENCOUNTER — Encounter: Payer: Self-pay | Admitting: Primary Care

## 2023-03-09 ENCOUNTER — Other Ambulatory Visit: Payer: Self-pay

## 2023-03-09 ENCOUNTER — Ambulatory Visit: Payer: BC Managed Care – PPO | Admitting: Primary Care

## 2023-03-09 VITALS — BP 142/80 | HR 94 | Temp 98.4°F | Ht 71.0 in | Wt 250.0 lb

## 2023-03-09 DIAGNOSIS — I10 Essential (primary) hypertension: Secondary | ICD-10-CM | POA: Diagnosis not present

## 2023-03-09 DIAGNOSIS — R053 Chronic cough: Secondary | ICD-10-CM

## 2023-03-09 DIAGNOSIS — G4733 Obstructive sleep apnea (adult) (pediatric): Secondary | ICD-10-CM | POA: Diagnosis not present

## 2023-03-09 DIAGNOSIS — J42 Unspecified chronic bronchitis: Secondary | ICD-10-CM | POA: Diagnosis not present

## 2023-03-09 DIAGNOSIS — J209 Acute bronchitis, unspecified: Secondary | ICD-10-CM

## 2023-03-09 DIAGNOSIS — R5383 Other fatigue: Secondary | ICD-10-CM | POA: Diagnosis not present

## 2023-03-09 LAB — CBC WITH DIFFERENTIAL/PLATELET
Absolute Monocytes: 865 cells/uL (ref 200–950)
Basophils Absolute: 76 cells/uL (ref 0–200)
Basophils Relative: 0.9 %
Eosinophils Absolute: 630 cells/uL — ABNORMAL HIGH (ref 15–500)
Eosinophils Relative: 7.5 %
HCT: 44.5 % (ref 38.5–50.0)
Hemoglobin: 14.9 g/dL (ref 13.2–17.1)
Lymphs Abs: 2150 cells/uL (ref 850–3900)
MCH: 30.8 pg (ref 27.0–33.0)
MCHC: 33.5 g/dL (ref 32.0–36.0)
MCV: 91.9 fL (ref 80.0–100.0)
MPV: 10.6 fL (ref 7.5–12.5)
Monocytes Relative: 10.3 %
Neutro Abs: 4679 cells/uL (ref 1500–7800)
Neutrophils Relative %: 55.7 %
Platelets: 339 10*3/uL (ref 140–400)
RBC: 4.84 10*6/uL (ref 4.20–5.80)
RDW: 13 % (ref 11.0–15.0)
Total Lymphocyte: 25.6 %
WBC: 8.4 10*3/uL (ref 3.8–10.8)

## 2023-03-09 MED ORDER — PROMETHAZINE-DM 6.25-15 MG/5ML PO SYRP: 5.0000 mL | ORAL_SOLUTION | Freq: Four times a day (QID) | ORAL | 0 refills | Status: DC | PRN

## 2023-03-09 NOTE — Assessment & Plan Note (Signed)
-   Patient has had a chronic cough since 2022, worse since February 2024. He gets frequent bouts of bronchitis requiring abx/steroids. Symptoms temporarily improve but return shortly after completing course.  Recently dx with RLL pneumonia 2 weeks ago and symptoms are persistent after taking Augmentin x 7 days. We will get CT chest without contrast to better evaluate lung parenchyma.  Also recommend checking sputum sample if patient is able.  We will check CBC and IgE due to questionable asthma history.  Advised he resume Trelegy 1 puff daily and use ipratropium-albuterol every 6 hours as needed for breakthrough shortness of breath or wheezing.  Holding off on additional antibiotics or steroids at this time. We will use caution with oral steroids due to his history of type 1 diabetes, if needed would consider low dose for a longer duration. He has been given flutter valve to use three times a day to loosen congestion and PWC for cough suppression as needed. FU 2 weeks or sooner if needed.

## 2023-03-09 NOTE — Progress Notes (Signed)
@Patient  ID: [name], male    DOB: [birth year], 57 y.o.   MRN: [id number]  Chief Complaint  Patient presents with   Follow-up    Referring provider: Everlean Cherry, MD  HPI: 57 year old male, never smoked. PMH significant for questionable asthma, recurrent bronchitis, OSA, lung nodule, CAD, NSTEMI, HTN, type 1 diabetes, hypothyroidism, lipidemia. Patient of Dr. Wynona Neat, last seen on 12/19/2022 for acute cough. Patient follows with cardiology for OSA.  03/09/2023 Patient presents today for follow-up pneumonia.  Patient has been seen by urgent care 3-4 times in the last several months for asthma/recurrent bronchitis.  He has been treated several times with antibiotics and prednisone.  He was most recently seen by urgent care on 02/21/2023 and diagnosed with community-acquired pneumonia right lower lung.  He was prescribed Augmentin for 7 days along with ipratropium albuterol nebulizer.  He reports some temporary improvement however symptoms returned shortly after completing antibiotics and steroids.  Nothing has particularly helped.  Daughter is concerned as patient has not returned to baseline and symptoms have been persistent since February.  He has coughing fits several times a day.  He chokes easily on his phlegm.  Cough is congested but nonproductive.  He gets tired easily and needs to stop to rest after walking short distances.  Since have been ongoing since February and are not improving.  He was using Trelegy up until he was given ipratropium albuterol nebulizers.  He has been using nebulizer 3-4 times a day with him short term relief.  He has tried [employer] for cough without significant improvement.  Pulmonary function testing in December 2022 was normal.  Has had not had recent CT imaging of his chest.  Reports compliance with CPAP nightly, cardiology managing.   No Known Allergies  Immunization History  Administered Date(s) Administered   Influenza Inj Mdck Quad Pf  08/09/2015, 07/16/2019   Influenza Split 08/09/2015   Influenza, Seasonal, Injecte, Preservative Fre 09/05/2016   Influenza,inj,Quad PF,6-35 Mos 08/16/2017   Influenza,inj,quad, With Preservative 08/16/2017   Influenza-Unspecified 07/23/2013, 09/05/2016, 08/16/2017, 07/23/2021   Moderna Sars-Covid-2 Vaccination 09/06/2020, 05/27/2021   Pneumococcal Conjugate-13 11/19/2013   Pneumococcal Polysaccharide-23 06/30/2009   Tdap 05/21/2012    Past Medical History:  Diagnosis Date   Coronary artery disease    s/p NSTEMI after ETT-echo - LHC 7/13:  LM < 10%, pLAD 90%, mLAD 95%, pCFX tandem 90%, mCFX 80-90%, pRCA 30%, mRCA 40-50%;  PCI: Xience DES x 2 to prox and mid LAD; Xience DES x 1 to prox and mid CFX   Family history of anesthesia complication    nausea    GERD (gastroesophageal reflux disease)    HLD (hyperlipidemia)    Hypertension    Lung nodule 2013   CT 7/13: 3 mm RML nodule - felt to be stable and benign/ follow up in 2014   Myocardial infarction [facility]) 2013   have 4 stents   S/P coronary artery stent placement    Type I diabetes mellitus (HCC)    insulin dependent    Tobacco History: Social History   Tobacco Use  Smoking Status Never  Smokeless Tobacco Never   Counseling given: Not Answered   Outpatient Medications Prior to Visit  Medication Sig Dispense Refill   ACCU-CHEK AVIVA PLUS test strip 1 each by Other route as needed.      albuterol (PROVENTIL HFA;VENTOLIN HFA) 108 (90 Base) MCG/ACT inhaler Inhale 1 puff into the lungs every 6 (six) hours as needed for wheezing or shortness  of breath.     aspirin 81 MG tablet Take 81 mg by mouth daily.     azelastine (ASTELIN) 0.1 % nasal spray Place 1 spray into the nose daily.      Coenzyme Q10 100 MG capsule Take by mouth.     Continuous Blood Gluc Sensor (DEXCOM G6 SENSOR) MISC      Evolocumab (REPATHA) 140 MG/ML SOSY INJECT 1 DOSE EVERY 14 DAYS 6 mL 3   fluticasone (FLONASE) 50 MCG/ACT nasal spray Place 1 spray into  both nostrils daily.     Fluticasone-Umeclidin-Vilant (TRELEGY ELLIPTA) 100-62.5-25 MCG/ACT AEPB Inhale 1 puff into the lungs daily. 60 each 3   insulin aspart (NOVOLOG FLEXPEN) 100 UNIT/ML FlexPen To be inject 3-4 times daily for meals or snacks at directed, up to 100 units daily.     isosorbide mononitrate (IMDUR) 60 MG 24 hr tablet TAKE 1 TABLET(60 MG) BY MOUTH DAILY 90 tablet 3   losartan (COZAAR) 25 MG tablet TAKE 1 TABLET(25 MG) BY MOUTH DAILY 90 tablet 2   Multiple Vitamins-Minerals (AIRBORNE GUMMIES) CHEW Chew 3 capsules by mouth daily.     nitroGLYCERIN (NITROSTAT) 0.4 MG SL tablet DISSOLVE 1 TABLET UNDER TONGUE EVERY 5 MINUTES AS NEEDED FOR CHEST PAIN AS DIRECTED 25 tablet 1   omeprazole (PRILOSEC) 20 MG capsule Take 20 mg by mouth daily.     rosuvastatin (CRESTOR) 5 MG tablet TAKE 1 TABLET(5 MG) BY MOUTH DAILY AT 6 PM 90 tablet 0   benzonatate (TESSALON) 100 MG capsule  (Patient not taking: Reported on 03/09/2023)     doxycycline (VIBRA-TABS) 100 MG tablet Take 1 tablet (100 mg total) by mouth 2 (two) times daily. (Patient not taking: Reported on 03/09/2023) 14 tablet 0   insulin degludec (TRESIBA) 100 UNIT/ML FlexTouch Pen Inject 38 Units into the skin every morning.     predniSONE (DELTASONE) 20 MG tablet 20 mg daily for 7 days (Patient not taking: Reported on 03/09/2023) 7 tablet 0   Semaglutide, 1 MG/DOSE, (OZEMPIC, 1 MG/DOSE,) 2 MG/1.5ML SOPN Inject 1 mg into the skin every Monday. (Patient not taking: Reported on 03/09/2023)     No facility-administered medications prior to visit.      Review of Systems  Review of Systems  Constitutional:  Positive for fatigue.  HENT:  Positive for congestion.   Respiratory:  Positive for cough.   Cardiovascular: Negative.    Physical Exam  BP (!) 142/80 (BP Location: Left Arm, Cuff Size: Normal)   Pulse 94   Temp 98.4 F (36.9 C)   Ht 5\' 11"  (1.803 m)   Wt 250 lb (113.4 kg)   SpO2 100%   BMI 34.87 kg/m  Physical  Exam Constitutional:      General: He is not in acute distress.    Appearance: Normal appearance. He is not ill-appearing.  HENT:     Head: Normocephalic and atraumatic.     Mouth/Throat:     Mouth: Mucous membranes are moist.     Pharynx: Oropharynx is clear.  Cardiovascular:     Rate and Rhythm: Normal rate and regular rhythm.  Pulmonary:     Effort: Pulmonary effort is normal.     Breath sounds: No wheezing or rales.     Comments: Dull/faint rhonchi right upper-middle lobe Musculoskeletal:        General: Normal range of motion.  Skin:    General: Skin is warm and dry.  Neurological:     General: No focal deficit present.  Mental Status: He is alert and oriented to person, place, and time. Mental status is at baseline.  Psychiatric:        Mood and Affect: Mood normal.        Behavior: Behavior normal.        Thought Content: Thought content normal.        Judgment: Judgment normal.      Lab Results:  CBC    Component Value Date/Time   WBC 7.7 11/13/2022 1024   RBC 4.95 11/13/2022 1024   HGB 15.7 11/13/2022 1024   HGB 14.8 06/24/2021 1352   HCT 46.3 11/13/2022 1024   HCT 42.9 06/24/2021 1352   PLT 359.0 11/13/2022 1024   PLT 299 06/24/2021 1352   MCV 93.5 11/13/2022 1024   MCV 93 06/24/2021 1352   MCH 32.1 06/24/2021 1352   MCH 31.6 08/01/2016 0929   MCHC 33.9 11/13/2022 1024   RDW 13.9 11/13/2022 1024   RDW 12.2 06/24/2021 1352   LYMPHSABS 1.7 11/13/2022 1024   MONOABS 0.8 11/13/2022 1024   EOSABS 0.5 11/13/2022 1024   BASOSABS 0.1 11/13/2022 1024    BMET    Component Value Date/Time   NA 136 11/13/2022 1024   NA 141 06/24/2021 1352   K 4.2 11/13/2022 1024   CL 102 11/13/2022 1024   CO2 26 11/13/2022 1024   GLUCOSE 170 (H) 11/13/2022 1024   BUN 14 11/13/2022 1024   BUN 12 06/24/2021 1352   CREATININE 0.91 11/13/2022 1024   CALCIUM 9.5 11/13/2022 1024   GFRNONAA 102 10/13/2019 1017   GFRAA 118 10/13/2019 1017    BNP No results found for:  "BNP"  ProBNP    Component Value Date/Time   PROBNP 30 05/18/2021 1701    Imaging: No results found.   Assessment & Plan:   Chronic bronchitis with acute exacerbation [facility]) - Patient has had a chronic cough since 2022, worse since February 2024. He gets frequent bouts of bronchitis requiring abx/steroids. Symptoms temporarily improve but return shortly after completing course.  Recently dx with RLL pneumonia 2 weeks ago and symptoms are persistent after taking Augmentin x 7 days. We will get CT chest without contrast to better evaluate lung parenchyma.  Also recommend checking sputum sample if patient is able.  We will check CBC and IgE due to questionable asthma history.  Advised he resume Trelegy 1 puff daily and use ipratropium-albuterol every 6 hours as needed for breakthrough shortness of breath or wheezing.  Holding off on additional antibiotics or steroids at this time. We will use caution with oral steroids due to his history of type 1 diabetes, if needed would consider low dose for a longer duration. He has been given flutter valve to use three times a day to loosen congestion and PWC for cough suppression as needed. FU 2 weeks or sooner if needed.       Glenford Bayley, NP 03/09/2023

## 2023-03-09 NOTE — Patient Instructions (Addendum)
Recommendations: Re-start Trelegy one puff daily Use nebulizer every 4-6 hours only as needed for sob/wheezing  Start flonase nasal spray  Take promethazine-dm every 6 hours as needed for cough Use flutter valve 3 times daily to loosen congestion  Orders: CT chest wo contrast re: persistent cough Sputum sample  Flutter valve Labs today   Follow-up: 2 weeks with Waynetta Sandy NP

## 2023-03-12 LAB — IGE: IgE (Immunoglobulin E), Serum: 145 kU/L — ABNORMAL HIGH (ref <=114)

## 2023-03-12 MED ORDER — MONTELUKAST SODIUM 10 MG PO TABS
10.0000 mg | ORAL_TABLET | Freq: Every day | ORAL | 5 refills | Status: DC
Start: 2023-03-12 — End: 2023-09-04

## 2023-03-12 MED ORDER — PREDNISONE 10 MG PO TABS: ORAL_TABLET | ORAL | 0 refills | Status: DC

## 2023-03-12 NOTE — Addendum Note (Signed)
Addended by: Glenford Bayley on: 03/12/2023 04:50 PM   Modules accepted: Orders

## 2023-03-12 NOTE — Progress Notes (Signed)
Please let patient know labs showed allergy markers are elevated. I already told him to resume Trelegy. He should go back on prednisone, I will send in lower dose. He should also start a medication called Singulair (montelukast) which he should take every night at bedtime. Monitor glucose and if readings are consistently elevated notify primary care or endocrine.

## 2023-03-13 ENCOUNTER — Telehealth: Payer: Self-pay | Admitting: Primary Care

## 2023-03-13 NOTE — Telephone Encounter (Signed)
Pt. Calling back to speak about his lab results on CBC w/Diff

## 2023-03-13 NOTE — Telephone Encounter (Signed)
Patient checking on message for results. Patient phone number is [phone].

## 2023-03-14 DIAGNOSIS — E10311 Type 1 diabetes mellitus with unspecified diabetic retinopathy with macular edema: Secondary | ICD-10-CM | POA: Diagnosis not present

## 2023-03-14 NOTE — Telephone Encounter (Signed)
[name] has already been received from [facility] - I called Central Scheduling was able to get moved up to 6/3.  Spoke to pt & gave him new appt info.  Gave him # to [employer] and made him aware he can call at anytime to see if anyone has cancelled.  Nothing further needed.

## 2023-03-14 NOTE — Telephone Encounter (Signed)
Went over lab results with patient.   Patient is wanting to know if his CT scan can be done sooner then June 5th. If he needs to go to another location he is willing to do that. Please advise

## 2023-03-15 NOTE — Telephone Encounter (Signed)
Spoke with pt and reviewed blood work results as dictated by Ames Dura. Pt stated understanding and did request that CT appointment be moved to an earlier one. I informed pt that the appointment that he already had was probably the earliest available. He stated understanding but still wanted me to ask. PCCs could you assist with figuring out if there is a earlier appointment.

## 2023-03-16 ENCOUNTER — Telehealth: Payer: Self-pay | Admitting: Primary Care

## 2023-03-16 ENCOUNTER — Ambulatory Visit (HOSPITAL_BASED_OUTPATIENT_CLINIC_OR_DEPARTMENT_OTHER)
Admission: RE | Admit: 2023-03-16 | Discharge: 2023-03-16 | Disposition: A | Discharge status: Home / Self Care | Payer: BC Managed Care – PPO | Source: Ambulatory Visit | Attending: Primary Care | Admitting: Primary Care

## 2023-03-16 DIAGNOSIS — R911 Solitary pulmonary nodule: Secondary | ICD-10-CM | POA: Diagnosis not present

## 2023-03-16 DIAGNOSIS — R053 Chronic cough: Secondary | ICD-10-CM | POA: Diagnosis not present

## 2023-03-16 MED ORDER — IPRATROPIUM-ALBUTEROL 0.5-2.5 (3) MG/3ML IN SOLN: 3.0000 mL | Freq: Four times a day (QID) | RESPIRATORY_TRACT | 1 refills | Status: DC | PRN

## 2023-03-16 NOTE — Telephone Encounter (Signed)
Called and spoke with both pt and spouse. Both said that pt IS NOT getting worse. Pt's spouse stated that pt had a bad coughing spell and due to that, the CT was pushed up sooner than originally scheduled and it was actually done today 5/24.  They will still keep appt as scheduled on 6/10 but are hoping that they can be able to be told something sooner than that about the results.  Routing to [place] for review.

## 2023-03-16 NOTE — Telephone Encounter (Signed)
Spoke with patient. States cough medication is working pretty well. Would like refill on duo-neb for nebulizer. Okay to send?

## 2023-03-16 NOTE — Telephone Encounter (Signed)
Nebulizer has been sent to pharmacy. Patient is aware. NFN

## 2023-03-16 NOTE — Telephone Encounter (Signed)
Sent RX ipratropium-albuterol to pharmacy onfile

## 2023-03-16 NOTE — Telephone Encounter (Signed)
I'm glad he is not feeling worse. We don't have results back yet from his CT chest today, may not get them for 24 hours. If symptoms worsen over the weekend he should go to ED.   How is the cough medication working? He should use albuterol hfa as well if having coughing fits  He should also still be on prednisone, continue that

## 2023-03-16 NOTE — Telephone Encounter (Signed)
PT's wife calling. PT getting worse and just had a CT done today. Now that that is done can she be seen sooner than her 6/10 appt? Please call wife or [place] to advise if Ms. Clent Ridges or Dr. Val Eagle can fit him in. I saw no opening. Thanks.   Reggie @ [phone]

## 2023-03-20 DIAGNOSIS — I10 Essential (primary) hypertension: Secondary | ICD-10-CM | POA: Diagnosis not present

## 2023-03-20 DIAGNOSIS — G4733 Obstructive sleep apnea (adult) (pediatric): Secondary | ICD-10-CM | POA: Diagnosis not present

## 2023-03-20 NOTE — Telephone Encounter (Signed)
Results aren't read yet, can we ask regina to call radiology?

## 2023-03-20 NOTE — Telephone Encounter (Signed)
Beth I can see where the CT has been completed but it doesn't look like it has been read. Can you see the results?

## 2023-03-21 ENCOUNTER — Ambulatory Visit: Payer: BC Managed Care – PPO | Admitting: Primary Care

## 2023-03-21 ENCOUNTER — Encounter: Payer: Self-pay | Admitting: Primary Care

## 2023-03-21 ENCOUNTER — Telehealth: Payer: Self-pay | Admitting: Primary Care

## 2023-03-21 VITALS — BP 128/78 | HR 87 | Temp 97.8°F | Ht 71.0 in | Wt 250.0 lb

## 2023-03-21 DIAGNOSIS — J42 Unspecified chronic bronchitis: Secondary | ICD-10-CM

## 2023-03-21 DIAGNOSIS — I2510 Atherosclerotic heart disease of native coronary artery without angina pectoris: Secondary | ICD-10-CM | POA: Diagnosis not present

## 2023-03-21 DIAGNOSIS — J209 Acute bronchitis, unspecified: Secondary | ICD-10-CM | POA: Diagnosis not present

## 2023-03-21 DIAGNOSIS — G4733 Obstructive sleep apnea (adult) (pediatric): Secondary | ICD-10-CM

## 2023-03-21 DIAGNOSIS — J4540 Moderate persistent asthma, uncomplicated: Secondary | ICD-10-CM | POA: Diagnosis not present

## 2023-03-21 MED ORDER — FLUTICASONE PROPIONATE 50 MCG/ACT NA SUSP: 1.0000 | Freq: Every day | NASAL | 3 refills | Status: DC

## 2023-03-21 NOTE — Telephone Encounter (Signed)
We do not know about biologic coverage until the authorization is processed. Please advise which you'd like to start with and pharmacy team can move forward from there  Chesley Mires, PharmD, MPH, BCPS, CPP Clinical Pharmacist (Rheumatology and Pulmonology)

## 2023-03-21 NOTE — Telephone Encounter (Signed)
Ok thanks, I am going to wait and see him back in 1 month. I changed him to high dose ICS. I will get back to you

## 2023-03-21 NOTE — Assessment & Plan Note (Signed)
-   Patient reports compliance, unable to wear several nights due to cough but recently resumed use. Cardiology managing.

## 2023-03-21 NOTE — Progress Notes (Signed)
@Patient  ID: [name], male    DOB: May 22, 1966, 57 y.o.   MRN: [id number]  Chief Complaint  Patient presents with   Follow-up    Referring provider: Everlean Cherry, MD  HPI: 57 year old male, never smoked. PMH significant for questionable asthma, recurrent bronchitis, OSA, lung nodule, CAD, NSTEMI, HTN, type 1 diabetes, hypothyroidism, lipidemia. Patient of Dr. Wynona Neat, last seen on 12/19/2022 for acute cough. Patient follows with cardiology for OSA.  03/09/2023 Patient presents today for follow-up pneumonia.  Patient has been seen by urgent care 3-4 times in the last several months for asthma/recurrent bronchitis.  He has been treated several times with antibiotics and prednisone.  He was most recently seen by urgent care on 02/21/2023 and diagnosed with community-acquired pneumonia right lower lung.  He was prescribed Augmentin for 7 days along with ipratropium albuterol nebulizer.  He reports some temporary improvement however symptoms returned shortly after completing antibiotics and steroids.  Nothing has particularly helped.  Daughter is concerned as patient has not returned to baseline and symptoms have been persistent since February.  He has coughing fits several times a day.  He chokes easily on his phlegm.  Cough is congested but nonproductive.  He gets tired easily and needs to stop to rest after walking short distances.  Since have been ongoing since February and are not improving.  He was using Trelegy up until he was given ipratropium albuterol nebulizers.  He has been using nebulizer 3-4 times a day with him short term relief.  He has tried [employer] for cough without significant improvement.  Pulmonary function testing in December 2022 was normal.  Has had not had recent CT imaging of his chest.  Reports compliance with CPAP nightly, cardiology managing.    03/21/2023- interim hx  Presents today for 2-week follow-up.  He is feeling better. Cough has for the most part  subsided. He still has some mild wheezing symptoms. He responds well to prednisone, has just a couple days left of taper. He is taking Trelegy as prescribed and singulair 10mg  at bedtime. Flutter valve has helped. IGE 145, Eos 600. CT chest showed no acute findings, mild central airway thickening without focal endobronchial lesion or airspace disease. Chronic pericardial calcifications and extensive coronary artery arthrosclerosis. Reports compliance with CPAP, has been unable to wear several nights due to cough but recently resumed- cardiology is managing.     No Known Allergies  Immunization History  Administered Date(s) Administered   Influenza Inj Mdck Quad Pf 08/09/2015, 07/16/2019   Influenza Split 08/09/2015   Influenza, Seasonal, Injecte, Preservative Fre 09/05/2016   Influenza,inj,Quad PF,6-35 Mos 08/16/2017   Influenza,inj,quad, With Preservative 08/16/2017   Influenza-Unspecified 07/23/2013, 09/05/2016, 08/16/2017, 07/23/2021   Moderna Sars-Covid-2 Vaccination 09/06/2020, 05/27/2021   Pneumococcal Conjugate-13 11/19/2013   Pneumococcal Polysaccharide-23 06/30/2009   Tdap 05/21/2012    Past Medical History:  Diagnosis Date   Coronary artery disease    s/p NSTEMI after ETT-echo - [facility] 7/13:  LM < 10%, pLAD 90%, mLAD 95%, pCFX tandem 90%, mCFX 80-90%, pRCA 30%, mRCA 40-50%;  PCI: Xience DES x 2 to prox and mid LAD; Xience DES x 1 to prox and mid CFX   Family history of anesthesia complication    nausea    GERD (gastroesophageal reflux disease)    HLD (hyperlipidemia)    Hypertension    Lung nodule 2013   CT 7/13: 3 mm RML nodule - felt to be stable and benign/ follow up in 2014  Myocardial infarction [facility]) 2013   have 4 stents   S/P coronary artery stent placement    Type I diabetes mellitus (HCC)    insulin dependent    Tobacco History: Social History   Tobacco Use  Smoking Status Never  Smokeless Tobacco Never   Counseling given: Not  Answered   Outpatient Medications Prior to Visit  Medication Sig Dispense Refill   ACCU-CHEK AVIVA PLUS test strip 1 each by Other route as needed.      albuterol (PROVENTIL HFA;VENTOLIN HFA) 108 (90 Base) MCG/ACT inhaler Inhale 1 puff into the lungs every 6 (six) hours as needed for wheezing or shortness of breath.     aspirin 81 MG tablet Take 81 mg by mouth daily.     azelastine (ASTELIN) 0.1 % nasal spray Place 1 spray into the nose daily.      benzonatate (TESSALON) 100 MG capsule      Coenzyme Q10 100 MG capsule Take by mouth.     Continuous Blood Gluc Sensor (DEXCOM G6 SENSOR) MISC      Evolocumab (REPATHA) 140 MG/ML SOSY INJECT 1 DOSE EVERY 14 DAYS 6 mL 3   Fluticasone-Umeclidin-Vilant (TRELEGY ELLIPTA) 100-62.5-25 MCG/ACT AEPB Inhale 1 puff into the lungs daily. 60 each 3   insulin aspart (NOVOLOG FLEXPEN) 100 UNIT/ML FlexPen To be inject 3-4 times daily for meals or snacks at directed, up to 100 units daily.     ipratropium-albuterol (DUONEB) 0.5-2.5 (3) MG/3ML SOLN Take 3 mLs by nebulization every 6 (six) hours as needed. 120 mL 1   isosorbide mononitrate (IMDUR) 60 MG 24 hr tablet TAKE 1 TABLET(60 MG) BY MOUTH DAILY 90 tablet 3   losartan (COZAAR) 25 MG tablet TAKE 1 TABLET(25 MG) BY MOUTH DAILY 90 tablet 2   montelukast (SINGULAIR) 10 MG tablet Take 1 tablet (10 mg total) by mouth at bedtime. 30 tablet 5   Multiple Vitamins-Minerals (AIRBORNE GUMMIES) CHEW Chew 3 capsules by mouth daily.     nitroGLYCERIN (NITROSTAT) 0.4 MG SL tablet DISSOLVE 1 TABLET UNDER TONGUE EVERY 5 MINUTES AS NEEDED FOR CHEST PAIN AS DIRECTED 25 tablet 1   omeprazole (PRILOSEC) 20 MG capsule Take 20 mg by mouth daily.     predniSONE (DELTASONE) 10 MG tablet Take 2 tablets daily x 5 days; then 1 tablet daily x 10 days; then stop 20 tablet 0   promethazine-dextromethorphan (PROMETHAZINE-DM) 6.25-15 MG/5ML syrup Take 5 mLs by mouth 4 (four) times daily as needed for cough. 240 mL 0   rosuvastatin (CRESTOR) 5  MG tablet TAKE 1 TABLET(5 MG) BY MOUTH DAILY AT 6 PM 90 tablet 0   fluticasone (FLONASE) 50 MCG/ACT nasal spray Place 1 spray into both nostrils daily.     doxycycline (VIBRA-TABS) 100 MG tablet Take 1 tablet (100 mg total) by mouth 2 (two) times daily. (Patient not taking: Reported on 03/21/2023) 14 tablet 0   insulin degludec (TRESIBA) 100 UNIT/ML FlexTouch Pen Inject 38 Units into the skin every morning.     Semaglutide, 1 MG/DOSE, (OZEMPIC, 1 MG/DOSE,) 2 MG/1.5ML SOPN Inject 1 mg into the skin every Monday. (Patient not taking: Reported on 03/21/2023)     No facility-administered medications prior to visit.   Review of Systems  Review of Systems  Constitutional: Negative.   HENT:  Positive for congestion.   Respiratory:  Positive for cough. Negative for chest tightness and wheezing.        Cough-improved   Cardiovascular: Negative.    Physical Exam  BP 128/78 (  BP Location: Left Arm, Patient Position: Sitting, Cuff Size: Large)   Pulse 87   Temp 97.8 F (36.6 C) (Temporal)   Ht 5\' 11"  (1.803 m)   Wt 250 lb (113.4 kg)   SpO2 96%   BMI 34.87 kg/m  Physical Exam Constitutional:      General: He is not in acute distress.    Appearance: Normal appearance. He is not ill-appearing.  HENT:     Head: Normocephalic and atraumatic.  Cardiovascular:     Rate and Rhythm: Normal rate and regular rhythm.  Pulmonary:     Effort: Pulmonary effort is normal. No respiratory distress.     Breath sounds: No stridor.     Comments: Dull wheeze/rhonchi - improved from previous visit Skin:    General: Skin is warm and dry.  Neurological:     General: No focal deficit present.     Mental Status: He is alert and oriented to person, place, and time. Mental status is at baseline.  Psychiatric:        Mood and Affect: Mood normal.        Behavior: Behavior normal.        Thought Content: Thought content normal.        Judgment: Judgment normal.      Lab Results:  CBC    Component Value  Date/Time   WBC 8.4 03/09/2023 1613   RBC 4.84 03/09/2023 1613   HGB 14.9 03/09/2023 1613   HGB 14.8 06/24/2021 1352   HCT 44.5 03/09/2023 1613   HCT 42.9 06/24/2021 1352   PLT 339 03/09/2023 1613   PLT 299 06/24/2021 1352   MCV 91.9 03/09/2023 1613   MCV 93 06/24/2021 1352   MCH 30.8 03/09/2023 1613   MCHC 33.5 03/09/2023 1613   RDW 13.0 03/09/2023 1613   RDW 12.2 06/24/2021 1352   LYMPHSABS 2,150 03/09/2023 1613   MONOABS 0.8 11/13/2022 1024   EOSABS 630 (H) 03/09/2023 1613   BASOSABS 76 03/09/2023 1613    BMET    Component Value Date/Time   NA 136 11/13/2022 1024   NA 141 06/24/2021 1352   K 4.2 11/13/2022 1024   CL 102 11/13/2022 1024   CO2 26 11/13/2022 1024   GLUCOSE 170 (H) 11/13/2022 1024   BUN 14 11/13/2022 1024   BUN 12 06/24/2021 1352   CREATININE 0.91 11/13/2022 1024   CALCIUM 9.5 11/13/2022 1024   GFRNONAA 102 10/13/2019 1017   GFRAA 118 10/13/2019 1017    BNP No results found for: "BNP"  ProBNP    Component Value Date/Time   PROBNP 30 05/18/2021 1701    Imaging: CT Chest Wo Contrast  Result Date: 03/21/2023 CLINICAL DATA:  Chronic cough since February. EXAM: CT CHEST WITHOUT CONTRAST TECHNIQUE: Multidetector CT imaging of the chest was performed following the standard protocol without IV contrast. RADIATION DOSE REDUCTION: This exam was performed according to the departmental dose-optimization program which includes automated exposure control, adjustment of the mA and/or kV according to patient size and/or use of iterative reconstruction technique. COMPARISON:  Chest radiographs 12/19/2022 and 08/19/2021. Chest CT 05/14/2013. FINDINGS: Cardiovascular: Extensive coronary artery atherosclerosis again noted with lesser involvement of the aorta and great vessels. The heart size is normal. There are chronic pericardial calcifications, greatest inferiorly. No significant pericardial fluid. Mediastinum/Nodes: There are no enlarged mediastinal, hilar or  axillary lymph nodes.Hilar assessment is limited by the lack of intravenous contrast, although the hilar contours appear unchanged. The thyroid gland, trachea and esophagus demonstrate no  significant findings. Lungs/Pleura: No pleural effusion or pneumothorax. There is mild central airway thickening without focal endobronchial lesion, airspace disease or suspicious pulmonary nodularity. A 4 mm right middle lobe nodule on image 97/4 is stable, consistent with a benign finding. No follow-up imaging recommended. Upper abdomen: No significant findings are seen in the visualized upper abdomen status post cholecystectomy. Musculoskeletal/Chest wall: There is no chest wall mass or suspicious osseous finding. IMPRESSION: 1. No acute chest findings or explanation for the patient's symptoms. 2. Mild central airway thickening without focal endobronchial lesion or airspace disease. 3. Chronic pericardial calcifications. 4. Extensive coronary artery atherosclerosis. 5.  Aortic Atherosclerosis (ICD10-I70.0). Electronically Signed   By: Carey Bullocks M.D.   On: 03/21/2023 10:58     Assessment & Plan:   Allergic asthma, moderate persistent, uncomplicated - Symptoms are consistent with moderate persistent allergic asthma with recurrent bronchitis  - Eosinophils and IGE were elevated indicating an allergic component - PFTs were normal in 2022 / FEV1 84% predicated, increased diffusion capacity. No BD response.  - CT chest 5/24 showed no acute process, mild airway thickening  - Recommend limiting oral steroids use as much as possible due to type 1 diabetes - If still having uncontrolled asthma symptoms we will discuss adding biologics at follow-up  Recommendations: Start Trelegy one puff daily in the morning (sample and RX sent) Start mucinex-dm twice daily to loosen congestion/cough Start fluticasone nasal spray 1 spray per nostril daily  Continue to use flutter valve three times a day  Continue Singulair  10mg  daily   Coronary artery disease - Advised patient follow-up with cardiology due to extensive coronary artery disease seen on CT imaging and dyspnea symptoms  - Continue Crestor 5mg  daily   OSA on CPAP - Patient reports compliance, unable to wear several nights due to cough but recently resumed use. Cardiology managing.    Glenford Bayley, NP 03/21/2023

## 2023-03-21 NOTE — Assessment & Plan Note (Addendum)
-   Symptoms are consistent with moderate persistent allergic asthma with recurrent bronchitis  - Eosinophils and IGE were elevated indicating an allergic component - PFTs were normal in 2022 / FEV1 84% predicated, increased diffusion capacity. No BD response.  - CT chest 5/24 showed no acute process, mild airway thickening  - Recommend limiting oral steroids use as much as possible due to type 1 diabetes - If still having uncontrolled asthma symptoms we will discuss adding biologics at follow-up  Recommendations: Start Trelegy one puff daily in the morning (sample and RX sent) Start mucinex-dm twice daily to loosen congestion/cough Start fluticasone nasal spray 1 spray per nostril daily  Continue to use flutter valve three times a day  Continue Singulair 10mg  daily

## 2023-03-21 NOTE — Telephone Encounter (Signed)
Radiologist can read if contacted by provider at [facility]. Phone number is [phone].

## 2023-03-21 NOTE — Patient Instructions (Addendum)
Symptoms are consistent with moderate persistent allergic asthma Eosinophils and IGE were elevated indicating an allergic component PFTs/breathing test was normal CT chest showed no acute process, mild airway thickening which you can see with chronic bronchitis/asthma   Recommendations: Stop Trelegy  Start Trelegy tomorrow- take one puff daily in the morning (sample and RX sent) Take mucinex-dm twice daily to loosen congestion/cough Start fluticasone nasal spray 1 spray per nostril daily  Continue to use flutter valve three times a day  Continue Singulair 10mg  daily  If still having asthma symptoms we will discuss adding biologic to your treatment (examples are Xolair, dupixent or fasenra- I will ask pharmacist which one would be covered under your plan)  Orders: Respiratory allergy panel   Follow-up:  Dr. Wynona Neat July 9th or 10th (otherwise ok to see Beth, NP)   Asthma, Adult  Asthma is a condition that causes swelling and narrowing of the airways. These are the passages that lead from the nose and mouth down into the lungs. When asthma symptoms get worse it is called an asthma attack or flare. This can make it hard to breathe. Asthma flares can range from minor to life-threatening. There is no cure for asthma, but medicines and lifestyle changes can help to control it. What are the causes? It is not known exactly what causes asthma, but certain things can cause asthma symptoms to get worse (triggers). What can trigger an asthma attack? Cigarette smoke. Mold. Dust. Your pet's skin flakes (dander). Cockroaches. Pollen. Air pollution (like household cleaners, wood smoke, smog, or Therapist, occupational). What are the signs or symptoms? Trouble breathing (shortness of breath). Coughing. Making high-pitched whistling sounds when you breathe, most often when you breathe out (wheezing). Chest tightness. Tiredness with little activity. Poor exercise tolerance. How is this  treated? Controller medicines that help prevent asthma symptoms. Fast-acting reliever or rescue medicines. These give short-term relief of asthma symptoms. Allergy medicines if your attacks are brought on by allergens. Medicines to help control the body's defense (immune) system. Staying away from the things that cause asthma attacks. Follow these instructions at home: Avoiding triggers in your home Do not allow anyone to smoke in your home. Limit use of fireplaces and wood stoves. Get rid of pests (such as roaches and mice) and their droppings. Keep your home clean. Clean your floors. Dust regularly. Use cleaning products that do not smell. Wash bed sheets and blankets every week in hot water. Dry them in a dryer. Have someone vacuum when you are not home. Change your heating and air conditioning filters often. Use blankets that are made of polyester or cotton. General instructions Take over-the-counter and prescription medicines only as told by your doctor. Do not smoke or use any products that contain nicotine or tobacco. If you need help quitting, ask your doctor. Stay away from secondhand smoke. Avoid doing things outdoors when allergen counts are high and when air quality is low. Warm up before you exercise. Take time to cool down after exercise. Use a peak flow meter as told by your doctor. A peak flow meter is a tool that measures how well your lungs are working. Keep track of the peak flow meter's readings. Write them down. Follow your asthma action plan. This is a written plan for taking care of your asthma and treating your attacks. Make sure you get all the shots (vaccines) that your doctor recommends. Ask your doctor about a flu shot and a pneumonia shot. Keep all follow-up visits. Contact  a doctor if: You have wheezing, shortness of breath, or a cough even while taking medicine to prevent attacks. The mucus you cough up (sputum) is thicker than usual. The mucus you cough  up changes from clear or white to yellow, green, gray, or is bloody. You have problems from the medicine you are taking, such as: A rash. Itching. Swelling. Trouble breathing. You need reliever medicines more than 2-3 times a week. Your peak flow reading is still at 50-79% of your personal best after following the action plan for 1 hour. You have a fever. Get help right away if: You seem to be worse and are not responding to medicine during an asthma attack. You are short of breath even at rest. You get short of breath when doing very little activity. You have trouble eating, drinking, or talking. You have chest pain or tightness. You have a fast heartbeat. Your lips or fingernails start to turn blue. You are light-headed or dizzy, or you faint. Your peak flow is less than 50% of your personal best. You feel too tired to breathe normally. These symptoms may be an emergency. Get help right away. Call 911. Do not wait to see if the symptoms will go away. Do not drive yourself to the hospital. Summary Asthma is a long-term (chronic) condition in which the airways get tight and narrow. An asthma attack can make it hard to breathe. Asthma cannot be cured, but medicines and lifestyle changes can help control it. Make sure you understand how to avoid triggers and how and when to use your medicines. Avoid things that can cause allergy symptoms (allergens). These include animal skin flakes (dander) and pollen from trees or grass. Avoid things that pollute the air. These may include household cleaners, wood smoke, smog, or chemical odors. This information is not intended to replace advice given to you by your health care provider. Make sure you discuss any questions you have with your health care provider. Document Revised: 07/18/2021 Document Reviewed: 07/18/2021 Elsevier Patient Education  2024 [employer].

## 2023-03-21 NOTE — Assessment & Plan Note (Signed)
-   Advised patient follow-up with cardiology due to extensive coronary artery disease seen on CT imaging and dyspnea symptoms  - Continue Crestor 5mg  daily

## 2023-03-21 NOTE — Telephone Encounter (Signed)
What biologics are covered on patients plan for allergic asthma?

## 2023-03-22 LAB — RESPIRATORY ALLERGY PROFILE REGION II ~~LOC~~
Allergen, A. alternata, m6: <0.1 kU/L
Allergen, Cedar tree, t12: 0.12 kU/L — ABNORMAL HIGH
Allergen, Comm Silver Birch, t9: 0.13 kU/L — ABNORMAL HIGH
Allergen, Cottonwood, t14: 0.14 kU/L — ABNORMAL HIGH
Allergen, D pternoyssinus,d7: <0.1 kU/L
Allergen, Mouse Urine Protein, e78: <0.1 kU/L
Allergen, Mulberry, t76: <0.1 kU/L
Allergen, Oak,t7: <0.1 kU/L
Allergen, P. notatum, m1: <0.1 kU/L
Aspergillus fumigatus, m3: <0.1 kU/L
Bermuda Grass: 0.11 kU/L — ABNORMAL HIGH
Box Elder IgE: <0.1 kU/L
CLADOSPORIUM HERBARUM (M2) IGE: <0.1 kU/L
COMMON RAGWEED (SHORT) (W1) IGE: 0.12 kU/L — ABNORMAL HIGH
Cat Dander: <0.1 kU/L
Class: 0
Class: 0
Class: 0
Class: 0
Class: 0
Class: 0
Class: 0
Class: 0
Class: 0
Class: 0
Class: 0
Class: 0
Class: 0
Class: 0
Class: 0
Class: 0
Cockroach: 0.14 kU/L — ABNORMAL HIGH
D. farinae: <0.1 kU/L
Dog Dander: <0.1 kU/L
Elm IgE: 0.26 kU/L — ABNORMAL HIGH
IgE (Immunoglobulin E), Serum: 158 kU/L — ABNORMAL HIGH (ref <=114)
Johnson Grass: <0.1 kU/L
Pecan/Hickory Tree IgE: 0.2 kU/L — ABNORMAL HIGH
Rough Pigweed  IgE: <0.1 kU/L
Sheep Sorrel IgE: <0.1 kU/L
Timothy Grass: <0.1 kU/L

## 2023-03-22 LAB — INTERPRETATION:

## 2023-03-26 ENCOUNTER — Ambulatory Visit (HOSPITAL_COMMUNITY): Payer: BC Managed Care – PPO

## 2023-03-27 ENCOUNTER — Other Ambulatory Visit: Payer: Self-pay | Admitting: Primary Care

## 2023-03-28 ENCOUNTER — Ambulatory Visit (HOSPITAL_COMMUNITY): Payer: BC Managed Care – PPO

## 2023-03-29 ENCOUNTER — Telehealth: Payer: Self-pay | Admitting: Primary Care

## 2023-03-30 NOTE — Telephone Encounter (Signed)
Great, thanks

## 2023-03-30 NOTE — Telephone Encounter (Signed)
  Kevin Bayley, NP 03/26/2023  3:48 PM EDT     Please let patient know he has mild allergies to cockroach, silver birch, cedar tree, Cottonwood, elm, pecan/hickory tree, grass, ragweed.   How is he doing on higher dose Trelegy? If doing better we can hold off and wait to discuss biologics at July apt    Patient is aware of results and voiced his understanding.  He stated that his breathing has improved with trelegy 200. Will route to [place] as an [place].

## 2023-04-02 ENCOUNTER — Ambulatory Visit: Payer: BC Managed Care – PPO | Admitting: Primary Care

## 2023-04-09 DIAGNOSIS — E108 Type 1 diabetes mellitus with unspecified complications: Secondary | ICD-10-CM | POA: Diagnosis not present

## 2023-04-12 ENCOUNTER — Other Ambulatory Visit: Payer: Self-pay | Admitting: Pulmonary Disease

## 2023-04-12 NOTE — Progress Notes (Signed)
Cardiology Office Note:   Date:  04/13/2023  ID:  [name], DOB [birth year], MRN [id number] PCP: Everlean Cherry, MD  [facility] Providers Cardiologist:  Rollene Rotunda, MD {  History of Present Illness:   [name] is a 57 y.o. male who presents for followup of his coronary disease. He is status post multivessel PCI.  He did have a stress test in 2017. He had an equivocal stress test had a cath with proximal mid LAD stents with mild in-stent restenosis.  He had known occlusion of a previously stented obtuse marginal.  The RCA was a dominant vessel with mild stenosis.  He was managed medically.   Since I last saw him he has had lots of problems with cough and fatigue.  He is seeing pulmonary and no diagnosis was forthcoming.  He just had a recent CT.  He had pulmonary function test.  He has had rounds of steroids that will make him feel better for a while.  He has had weight gain.  The had an ankle injury and now he has some increased swelling in his right ankle that was injured but he also has swelling in his left leg as well.  He has not been having enough energy to do much exercise.  He is not having any chest pressure, neck or arm discomfort.  Is not describing PND or orthopnea.  He is not having any new palpitations, presyncope or syncope.   ROS: As stated in the HPI and negative for all other systems.  Studies Reviewed:    EKG:   EKG Interpretation  Date/Time:  Friday April 13 2023 08:44:02 EDT Ventricular Rate:  91 PR Interval:  154 QRS Duration: 86 QT Interval:  348 QTC Calculation: 428 R Axis:   82 Text Interpretation: Normal sinus rhythm ST & T wave abnormality, consider inferolateral ischemia When compared with ECG of 01-Jul-2021 08:57, No significant change was found The above changes are non specific and unchanged from the previous. Confirmed by Rollene Rotunda ([id number]) on 04/13/2023 9:02:45 AM     Risk Assessment/Calculations:     Physical Exam:   VS:   BP 136/70 (BP Location: Left Arm, Patient Position: Sitting, Cuff Size: Normal)   Pulse 91   Ht 5\' 11"  (1.803 m)   Wt 249 lb 3.2 oz (113 kg)   SpO2 95%   BMI 34.76 kg/m    Wt Readings from Last 3 Encounters:  04/13/23 249 lb 3.2 oz (113 kg)  03/21/23 250 lb (113.4 kg)  03/09/23 250 lb (113.4 kg)     GEN: Well nourished, well developed in no acute distress NECK: Positive JVD; No carotid bruits CARDIAC: RRR, no murmurs, rubs, gallops RESPIRATORY:  Clear to auscultation without rales, wheezing or rhonchi  ABDOMEN: Soft, non-tender, non-distended EXTREMITIES: Moderate right greater than left leg edema; No deformity   ASSESSMENT AND PLAN:   CAD :  The patient has no new sypmtoms.  No further cardiovascular testing is indicated.  We will continue with aggressive risk reduction and meds as listed.   Hypertension : Blood pressure is upper limits of normal.  No change in therapy at this point.  Hyperlipidemia : LDL last year was 16.  LPA has been okay.  I will repeat a lipid this year.  DM : A1c is up to 7.4 from 7 this is made worse by his steroids.  I will defer to his primary provider.  Sleep apnea: He does use CPAP.  Edema: I  am going to give him Lasix 20 mg daily and check a basic metabolic profile in about 10 days.  He is going to wear compression stocks and keep his feet elevated.  I am going to check an echocardiogram.  He has some calcification in his pericardium.  I am going to have a low threshold for further testing to include right heart catheterization.     Follow up with me in about 1 month or so.  Signed, Rollene Rotunda, MD

## 2023-04-13 ENCOUNTER — Encounter: Payer: Self-pay | Admitting: Cardiology

## 2023-04-13 ENCOUNTER — Ambulatory Visit: Payer: BC Managed Care – PPO | Attending: Cardiology | Admitting: Cardiology

## 2023-04-13 VITALS — BP 136/70 | HR 91 | Ht 71.0 in | Wt 249.2 lb

## 2023-04-13 DIAGNOSIS — I2510 Atherosclerotic heart disease of native coronary artery without angina pectoris: Secondary | ICD-10-CM | POA: Diagnosis not present

## 2023-04-13 DIAGNOSIS — E118 Type 2 diabetes mellitus with unspecified complications: Secondary | ICD-10-CM

## 2023-04-13 DIAGNOSIS — R601 Generalized edema: Secondary | ICD-10-CM

## 2023-04-13 DIAGNOSIS — I10 Essential (primary) hypertension: Secondary | ICD-10-CM | POA: Diagnosis not present

## 2023-04-13 DIAGNOSIS — E785 Hyperlipidemia, unspecified: Secondary | ICD-10-CM

## 2023-04-13 MED ORDER — FUROSEMIDE 20 MG PO TABS
20.0000 mg | ORAL_TABLET | Freq: Every day | ORAL | 3 refills | Status: DC
Start: 2023-04-13 — End: 2024-04-01

## 2023-04-13 NOTE — Patient Instructions (Signed)
Medication Instructions:   START FUROSEMIDE 20 MG ONCE DAILY  *If you need a refill on your cardiac medications before your next appointment, please call your pharmacy*   Lab Work:  Your physician recommends that you return for lab work in: 10 DAYS-FASTING  If you have labs (blood work) drawn today and your tests are completely normal, you will receive your results only by: MyChart Message (if you have MyChart) OR A paper copy in the mail If you have any lab test that is abnormal or we need to change your treatment, we will call you to review the results.   Testing/Procedures:  Your physician has requested that you have an echocardiogram. Echocardiography is a painless test that uses sound waves to create images of your heart. It provides your doctor with information about the size and shape of your heart and how well your heart's chambers and valves are working. This procedure takes approximately one hour. There are no restrictions for this procedure. Please do NOT wear cologne, perfume, aftershave, or lotions (deodorant is allowed). Please arrive 15 minutes prior to your appointment time. [address]   Follow-Up: At [facility], you and your health needs are our priority.  As part of our continuing mission to provide you with exceptional heart care, we have created designated Provider Care Teams.  These Care Teams include your primary Cardiologist (physician) and Advanced Practice Providers (APPs -  Physician Assistants and Nurse Practitioners) who all work together to provide you with the care you need, when you need it.  We recommend signing up for the patient portal called "MyChart".  Sign up information is provided on this After Visit Summary.  MyChart is used to connect with patients for Virtual Visits (Telemedicine).  Patients are able to view lab/test results, encounter notes, upcoming appointments, etc.  Non-urgent messages can be sent to your provider as  well.   To learn more about what you can do with MyChart, go to [email or web address].    Your next appointment:   1 month(s)  Provider:   Rollene Rotunda, MD

## 2023-04-17 DIAGNOSIS — R601 Generalized edema: Secondary | ICD-10-CM | POA: Diagnosis not present

## 2023-04-17 DIAGNOSIS — E785 Hyperlipidemia, unspecified: Secondary | ICD-10-CM | POA: Diagnosis not present

## 2023-04-18 LAB — BASIC METABOLIC PANEL
BUN/Creatinine Ratio: 16 (ref 9–20)
BUN: 14 mg/dL (ref 6–24)
CO2: 20 mmol/L (ref 20–29)
Calcium: 9.8 mg/dL (ref 8.7–10.2)
Chloride: 103 mmol/L (ref 96–106)
Creatinine, Ser: 0.9 mg/dL (ref 0.76–1.27)
Glucose: 118 mg/dL — ABNORMAL HIGH (ref 70–99)
Potassium: 4.1 mmol/L (ref 3.5–5.2)
Sodium: 141 mmol/L (ref 134–144)
eGFR: 99 mL/min/{1.73_m2} (ref >59)

## 2023-04-18 LAB — LIPID PANEL
Chol/HDL Ratio: 1.7 ratio (ref 0.0–5.0)
Cholesterol, Total: 74 mg/dL — ABNORMAL LOW (ref 100–199)
HDL: 44 mg/dL (ref >39)
LDL Chol Calc (NIH): 16 mg/dL (ref 0–99)
Triglycerides: 57 mg/dL (ref 0–149)
VLDL Cholesterol Cal: 14 mg/dL (ref 5–40)

## 2023-04-19 DIAGNOSIS — I10 Essential (primary) hypertension: Secondary | ICD-10-CM | POA: Diagnosis not present

## 2023-04-19 DIAGNOSIS — G4733 Obstructive sleep apnea (adult) (pediatric): Secondary | ICD-10-CM | POA: Diagnosis not present

## 2023-04-21 DIAGNOSIS — I10 Essential (primary) hypertension: Secondary | ICD-10-CM | POA: Diagnosis not present

## 2023-04-21 DIAGNOSIS — G4733 Obstructive sleep apnea (adult) (pediatric): Secondary | ICD-10-CM | POA: Diagnosis not present

## 2023-04-26 ENCOUNTER — Encounter: Payer: Self-pay | Admitting: Cardiology

## 2023-04-30 ENCOUNTER — Encounter (INDEPENDENT_AMBULATORY_CARE_PROVIDER_SITE_OTHER): Payer: BC Managed Care – PPO | Admitting: Ophthalmology

## 2023-04-30 DIAGNOSIS — Z794 Long term (current) use of insulin: Secondary | ICD-10-CM

## 2023-04-30 DIAGNOSIS — H35033 Hypertensive retinopathy, bilateral: Secondary | ICD-10-CM | POA: Diagnosis not present

## 2023-04-30 DIAGNOSIS — H43812 Vitreous degeneration, left eye: Secondary | ICD-10-CM | POA: Diagnosis not present

## 2023-04-30 DIAGNOSIS — E103593 Type 1 diabetes mellitus with proliferative diabetic retinopathy without macular edema, bilateral: Secondary | ICD-10-CM

## 2023-04-30 DIAGNOSIS — I10 Essential (primary) hypertension: Secondary | ICD-10-CM

## 2023-05-02 ENCOUNTER — Ambulatory Visit: Payer: BC Managed Care – PPO | Admitting: Primary Care

## 2023-05-02 ENCOUNTER — Encounter: Payer: Self-pay | Admitting: Primary Care

## 2023-05-02 VITALS — BP 118/60 | HR 93 | Temp 98.4°F | Ht 71.0 in | Wt 238.8 lb

## 2023-05-02 DIAGNOSIS — G4733 Obstructive sleep apnea (adult) (pediatric): Secondary | ICD-10-CM | POA: Diagnosis not present

## 2023-05-02 DIAGNOSIS — J4540 Moderate persistent asthma, uncomplicated: Secondary | ICD-10-CM | POA: Diagnosis not present

## 2023-05-02 MED ORDER — TRELEGY ELLIPTA 200-62.5-25 MCG/ACT IN AEPB: 1.0000 | INHALATION_SPRAY | Freq: Every day | RESPIRATORY_TRACT | 0 refills | Status: DC

## 2023-05-02 MED ORDER — TRELEGY ELLIPTA 200-62.5-25 MCG/ACT IN AEPB: 1.0000 | INHALATION_SPRAY | Freq: Every day | RESPIRATORY_TRACT | 5 refills | Status: DC

## 2023-05-02 NOTE — Patient Instructions (Addendum)
I'm so glad you are feeling better  Recommendations: - Stay on Trelegy - one puff daily in the morning  - Use Albuterol 1-2 puffs every 6 hours as needed for breakthrough shortness of breath/wheezing  - Take mucinex 600mg  1-2 times a day as needed to loosen congestion - Continue Flonase nasal spray daily - Continue Singulair (montelukast) 10mg  at bedtime  - Continue CPAP nightly  Follow-up: - 3 months with Beth NP or sooner if needed

## 2023-05-02 NOTE — Progress Notes (Signed)
@Patient  ID: [name], male    DOB: [birth year], 57 y.o.   MRN: [id number]  Chief Complaint  Patient presents with   Follow-up    Doing well.  Did do well with sample of Trelegy 200 mcg.  Did not receive rx of Trelelgy 200 mcg.  Currently using Trelegy 100 mcg.    Referring provider: Everlean Cherry, MD  HPI: 57 year old male, never smoked. PMH significant for questionable asthma, recurrent bronchitis, OSA, lung nodule, CAD, NSTEMI, HTN, type 1 diabetes, hypothyroidism, lipidemia. Patient of Dr. Wynona Neat, last seen on 12/19/2022 for acute cough. Patient follows with cardiology for OSA.  Previous [facility] pulmonary encounter:  03/09/2023 Patient presents today for follow-up pneumonia.  Patient has been seen by urgent care 3-4 times in the last several months for asthma/recurrent bronchitis.  He has been treated several times with antibiotics and prednisone.  He was most recently seen by urgent care on 02/21/2023 and diagnosed with community-acquired pneumonia right lower lung.  He was prescribed Augmentin for 7 days along with ipratropium albuterol nebulizer.  He reports some temporary improvement however symptoms returned shortly after completing antibiotics and steroids.  Nothing has particularly helped.  Daughter is concerned as patient has not returned to baseline and symptoms have been persistent since February.  He has coughing fits several times a day.  He chokes easily on his phlegm.  Cough is congested but nonproductive.  He gets tired easily and needs to stop to rest after walking short distances.  Since have been ongoing since February and are not improving.  He was using Trelegy up until he was given ipratropium albuterol nebulizers.  He has been using nebulizer 3-4 times a day with him short term relief.  He has tried [employer] for cough without significant improvement.  Pulmonary function testing in December 2022 was normal.  Has had not had recent CT imaging of his chest.  Reports  compliance with CPAP nightly, cardiology managing.  03/21/2023 Presents today for 2-week follow-up.  He is feeling better. Cough has for the most part subsided. He still has some mild wheezing symptoms. He responds well to prednisone, has just a couple days left of taper. He is taking Trelegy as prescribed and singulair 10mg  at bedtime. Flutter valve has helped. IGE 145, Eos 600. CT chest showed no acute findings, mild central airway thickening without focal endobronchial lesion or airspace disease. Chronic pericardial calcifications and extensive coronary artery arthrosclerosis. Reports compliance with CPAP, has been unable to wear several nights due to cough but recently resumed- cardiology is managing.     Allergic asthma, moderate persistent, uncomplicated - Symptoms are consistent with moderate persistent allergic asthma with recurrent bronchitis  - Eosinophils and IGE were elevated indicating an allergic component - PFTs were normal in 2022 / FEV1 84% predicated, increased diffusion capacity. No BD response.  - CT chest 5/24 showed no acute process, mild airway thickening  - Recommend limiting oral steroids use as much as possible due to type 1 diabetes - If still having uncontrolled asthma symptoms we will discuss adding biologics at follow-up  Recommendations: Start Trelegy one puff daily in the morning (sample and RX sent) Start mucinex-dm twice daily to loosen congestion/cough Start fluticasone nasal spray 1 spray per nostril daily  Continue to use flutter valve three times a day  Continue Singulair 10mg  daily   Coronary artery disease - Advised patient follow-up with cardiology due to extensive coronary artery disease seen on CT imaging and dyspnea symptoms  -  Continue Crestor 5mg  daily   OSA on CPAP - Patient reports compliance, unable to wear several nights due to cough but recently resumed use. Cardiology managing.    05/02/2023- Interim hx Patient presents  today for 1 month follow-up. He is doing very well, feels 90% better. His breathing has been significantly better, he did well on Trelegy sample. Needs prescription sent to pharmacy. He has noticed less wheezing. He has not needed oral prednisone in the last month. He is using fluticasone nasal spray daily and taking mucinex as needed to loosen congestion. He has been able to exercise and is working on weight loss. He saw cardiology, ordered for echocardiogram tomorrow. He is wearing CPAP consistently every night.    No Known Allergies  Immunization History  Administered Date(s) Administered   Influenza Inj Mdck Quad Pf 08/09/2015, 07/16/2019   Influenza Split 08/09/2015   Influenza, Seasonal, Injecte, Preservative Fre 09/05/2016   Influenza,inj,Quad PF,6-35 Mos 08/16/2017   Influenza,inj,quad, With Preservative 08/16/2017   Influenza-Unspecified 07/23/2013, 09/05/2016, 08/16/2017, 07/23/2021   Moderna Sars-Covid-2 Vaccination 09/06/2020, 05/27/2021   Pneumococcal Conjugate-13 11/19/2013   Pneumococcal Polysaccharide-23 06/30/2009   Tdap 05/21/2012    Past Medical History:  Diagnosis Date   Coronary artery disease    s/p NSTEMI after ETT-echo - [facility] 7/13:  LM < 10%, pLAD 90%, mLAD 95%, pCFX tandem 90%, mCFX 80-90%, pRCA 30%, mRCA 40-50%;  PCI: Xience DES x 2 to prox and mid LAD; Xience DES x 1 to prox and mid CFX   Family history of anesthesia complication    nausea    GERD (gastroesophageal reflux disease)    HLD (hyperlipidemia)    Hypertension    Lung nodule 2013   CT 7/13: 3 mm RML nodule - felt to be stable and benign/ follow up in 2014   Myocardial infarction [facility]) 2013   have 4 stents   S/P coronary artery stent placement    Type I diabetes mellitus (HCC)    insulin dependent    Tobacco History: Social History   Tobacco Use  Smoking Status Never  Smokeless Tobacco Never   Counseling given: Not Answered   Outpatient Medications Prior to Visit  Medication  Sig Dispense Refill   ACCU-CHEK AVIVA PLUS test strip 1 each by Other route as needed.      albuterol (PROVENTIL HFA;VENTOLIN HFA) 108 (90 Base) MCG/ACT inhaler Inhale 1 puff into the lungs every 6 (six) hours as needed for wheezing or shortness of breath.     aspirin 81 MG tablet Take 81 mg by mouth daily.     Coenzyme Q10 100 MG capsule Take by mouth.     Continuous Blood Gluc Sensor (DEXCOM G6 SENSOR) MISC      Evolocumab (REPATHA) 140 MG/ML SOSY INJECT 1 DOSE EVERY 14 DAYS 6 mL 3   fluticasone (FLONASE) 50 MCG/ACT nasal spray Place 1 spray into both nostrils daily. 16 g 3   furosemide (LASIX) 20 MG tablet Take 1 tablet (20 mg total) by mouth daily. 90 tablet 3   insulin aspart (NOVOLOG FLEXPEN) 100 UNIT/ML FlexPen To be inject 3-4 times daily for meals or snacks at directed, up to 100 units daily.     ipratropium-albuterol (DUONEB) 0.5-2.5 (3) MG/3ML SOLN Take 3 mLs by nebulization every 6 (six) hours as needed. 120 mL 1   isosorbide mononitrate (IMDUR) 60 MG 24 hr tablet TAKE 1 TABLET(60 MG) BY MOUTH DAILY 90 tablet 3   losartan (COZAAR) 25 MG tablet TAKE 1 TABLET(25  MG) BY MOUTH DAILY 90 tablet 2   montelukast (SINGULAIR) 10 MG tablet Take 1 tablet (10 mg total) by mouth at bedtime. 30 tablet 5   Multiple Vitamins-Minerals (AIRBORNE GUMMIES) CHEW Chew 3 capsules by mouth daily.     nitroGLYCERIN (NITROSTAT) 0.4 MG SL tablet DISSOLVE 1 TABLET UNDER TONGUE EVERY 5 MINUTES AS NEEDED FOR CHEST PAIN AS DIRECTED 25 tablet 1   omeprazole (PRILOSEC) 20 MG capsule Take 20 mg by mouth daily.     rosuvastatin (CRESTOR) 5 MG tablet TAKE 1 TABLET(5 MG) BY MOUTH DAILY AT 6 PM 90 tablet 0   TRELEGY ELLIPTA 100-62.5-25 MCG/ACT AEPB INHALE 1 PUFF INTO THE LUNGS DAILY 60 each 3   azelastine (ASTELIN) 0.1 % nasal spray Place 1 spray into the nose daily.  (Patient not taking: Reported on 05/02/2023)     benzonatate (TESSALON) 100 MG capsule  (Patient not taking: Reported on 05/02/2023)     doxycycline  (VIBRA-TABS) 100 MG tablet Take 1 tablet (100 mg total) by mouth 2 (two) times daily. (Patient not taking: Reported on 05/02/2023) 14 tablet 0   predniSONE (DELTASONE) 10 MG tablet Take 2 tablets daily x 5 days; then 1 tablet daily x 10 days; then stop (Patient not taking: Reported on 05/02/2023) 20 tablet 0   promethazine-dextromethorphan (PROMETHAZINE-DM) 6.25-15 MG/5ML syrup Take 5 mLs by mouth 4 (four) times daily as needed for cough. (Patient not taking: Reported on 05/02/2023) 240 mL 0   No facility-administered medications prior to visit.    Review of Systems  Review of Systems  Constitutional: Negative.   HENT: Negative.    Respiratory:  Negative for cough, shortness of breath and wheezing.   Cardiovascular: Negative.    Physical Exam  BP 118/60 (BP Location: Left Arm, Patient Position: Sitting, Cuff Size: Large)   Pulse 93   Temp 98.4 F (36.9 C) (Oral)   Ht 5\' 11"  (1.803 m)   Wt 238 lb 12.8 oz (108.3 kg)   SpO2 96%   BMI 33.31 kg/m  Physical Exam Constitutional:      General: He is not in acute distress.    Appearance: Normal appearance. He is not ill-appearing.  HENT:     Head: Normocephalic and atraumatic.     Mouth/Throat:     Mouth: Mucous membranes are moist.     Pharynx: Oropharynx is clear.  Cardiovascular:     Rate and Rhythm: Normal rate and regular rhythm.  Pulmonary:     Effort: Pulmonary effort is normal. No respiratory distress.     Breath sounds: Normal breath sounds. No stridor. No rhonchi.     Comments: Faint distant wheeze right middle lobe, otherwise clear  Musculoskeletal:        General: Normal range of motion.  Skin:    General: Skin is warm and dry.  Neurological:     General: No focal deficit present.     Mental Status: He is alert and oriented to person, place, and time. Mental status is at baseline.  Psychiatric:        Mood and Affect: Mood normal.        Behavior: Behavior normal.        Thought Content: Thought content normal.         Judgment: Judgment normal.      Lab Results:  CBC    Component Value Date/Time   WBC 8.4 03/09/2023 1613   RBC 4.84 03/09/2023 1613   HGB 14.9 03/09/2023 1613   HGB 14.8  06/24/2021 1352   HCT 44.5 03/09/2023 1613   HCT 42.9 06/24/2021 1352   PLT 339 03/09/2023 1613   PLT 299 06/24/2021 1352   MCV 91.9 03/09/2023 1613   MCV 93 06/24/2021 1352   MCH 30.8 03/09/2023 1613   MCHC 33.5 03/09/2023 1613   RDW 13.0 03/09/2023 1613   RDW 12.2 06/24/2021 1352   LYMPHSABS 2,150 03/09/2023 1613   MONOABS 0.8 11/13/2022 1024   EOSABS 630 (H) 03/09/2023 1613   BASOSABS 76 03/09/2023 1613    BMET    Component Value Date/Time   NA 141 04/17/2023 0908   K 4.1 04/17/2023 0908   CL 103 04/17/2023 0908   CO2 20 04/17/2023 0908   GLUCOSE 118 (H) 04/17/2023 0908   GLUCOSE 170 (H) 11/13/2022 1024   BUN 14 04/17/2023 0908   CREATININE 0.90 04/17/2023 0908   CALCIUM 9.8 04/17/2023 0908   GFRNONAA 102 10/13/2019 1017   GFRAA 118 10/13/2019 1017    BNP No results found for: "BNP"  ProBNP    Component Value Date/Time   PROBNP 30 05/18/2021 1701    Imaging: No results found.   Assessment & Plan:   Allergic asthma, moderate persistent, uncomplicated - Improved. Patient has moderate persistent allergic asthma with recurrent bronchitis. He was treated for prolonged exacerbation in May. Trelegy was increased to daily with good results. Respiratory symptoms have significantly improved and he is for the most part back to his baseline. We will continue him on Trelegy daily, Singulair 10mg  qhs, prn mucinex and Albuterol. FU in 3 months or sooner if needed. If he continues to have recurrent flare ups recommend adding biologic.   OSA on CPAP - Continue CPAP nightly  Coronary artery disease - Patient saw Dr. Antoine Poche in June. He is asymptomatic, recommended to continue risk reduction and medications as prescribed. Scheduled for echocardiogram tomorrow.    Glenford Bayley, NP 05/02/2023

## 2023-05-02 NOTE — Assessment & Plan Note (Signed)
-   Patient saw Dr. Antoine Poche in June. He is asymptomatic, recommended to continue risk reduction and medications as prescribed. Scheduled for echocardiogram tomorrow.

## 2023-05-02 NOTE — Assessment & Plan Note (Addendum)
Continue CPAP nightly. °

## 2023-05-02 NOTE — Assessment & Plan Note (Addendum)
-   Improved. Patient has moderate persistent allergic asthma with recurrent bronchitis. He was treated for prolonged exacerbation in May. Trelegy was increased to daily with good results. Respiratory symptoms have significantly improved and he is for the most part back to his baseline. We will continue him on Trelegy daily, Singulair 10mg  qhs, prn mucinex and Albuterol. FU in 3 months or sooner if needed. If he continues to have recurrent flare ups recommend adding biologic.

## 2023-05-03 ENCOUNTER — Ambulatory Visit (HOSPITAL_COMMUNITY): Payer: BC Managed Care – PPO | Attending: Cardiology

## 2023-05-03 DIAGNOSIS — R601 Generalized edema: Secondary | ICD-10-CM | POA: Insufficient documentation

## 2023-05-03 LAB — ECHOCARDIOGRAM COMPLETE
Area-P 1/2: 4.39 cm2
S' Lateral: 3 cm

## 2023-05-03 MED ORDER — PERFLUTREN LIPID MICROSPHERE
1.0000 mL | INTRAVENOUS | Status: AC | PRN
Start: 2023-05-03 — End: 2023-05-03
  Administered 2023-05-03: 2 mL via INTRAVENOUS

## 2023-05-19 DIAGNOSIS — G4733 Obstructive sleep apnea (adult) (pediatric): Secondary | ICD-10-CM | POA: Diagnosis not present

## 2023-05-19 DIAGNOSIS — I10 Essential (primary) hypertension: Secondary | ICD-10-CM | POA: Diagnosis not present

## 2023-05-22 ENCOUNTER — Ambulatory Visit: Payer: BC Managed Care – PPO | Admitting: Cardiology

## 2023-06-14 DIAGNOSIS — E103593 Type 1 diabetes mellitus with proliferative diabetic retinopathy without macular edema, bilateral: Secondary | ICD-10-CM | POA: Diagnosis not present

## 2023-06-19 DIAGNOSIS — I10 Essential (primary) hypertension: Secondary | ICD-10-CM | POA: Diagnosis not present

## 2023-06-19 DIAGNOSIS — G4733 Obstructive sleep apnea (adult) (pediatric): Secondary | ICD-10-CM | POA: Diagnosis not present

## 2023-07-22 DIAGNOSIS — I10 Essential (primary) hypertension: Secondary | ICD-10-CM | POA: Diagnosis not present

## 2023-07-22 DIAGNOSIS — G4733 Obstructive sleep apnea (adult) (pediatric): Secondary | ICD-10-CM | POA: Diagnosis not present

## 2023-07-30 DIAGNOSIS — E108 Type 1 diabetes mellitus with unspecified complications: Secondary | ICD-10-CM | POA: Diagnosis not present

## 2023-08-02 DIAGNOSIS — L570 Actinic keratosis: Secondary | ICD-10-CM | POA: Diagnosis not present

## 2023-08-02 DIAGNOSIS — D485 Neoplasm of uncertain behavior of skin: Secondary | ICD-10-CM | POA: Diagnosis not present

## 2023-08-02 DIAGNOSIS — L820 Inflamed seborrheic keratosis: Secondary | ICD-10-CM | POA: Diagnosis not present

## 2023-08-08 ENCOUNTER — Ambulatory Visit: Payer: BC Managed Care – PPO | Admitting: Primary Care

## 2023-08-17 ENCOUNTER — Ambulatory Visit: Payer: BC Managed Care – PPO | Admitting: Cardiology

## 2023-08-19 ENCOUNTER — Other Ambulatory Visit: Payer: Self-pay | Admitting: Internal Medicine

## 2023-08-21 DIAGNOSIS — G4733 Obstructive sleep apnea (adult) (pediatric): Secondary | ICD-10-CM | POA: Diagnosis not present

## 2023-08-21 DIAGNOSIS — I10 Essential (primary) hypertension: Secondary | ICD-10-CM | POA: Diagnosis not present

## 2023-08-28 DIAGNOSIS — H401111 Primary open-angle glaucoma, right eye, mild stage: Secondary | ICD-10-CM | POA: Diagnosis not present

## 2023-08-28 DIAGNOSIS — H40022 Open angle with borderline findings, high risk, left eye: Secondary | ICD-10-CM | POA: Diagnosis not present

## 2023-08-28 DIAGNOSIS — H524 Presbyopia: Secondary | ICD-10-CM | POA: Diagnosis not present

## 2023-08-30 DIAGNOSIS — C44729 Squamous cell carcinoma of skin of left lower limb, including hip: Secondary | ICD-10-CM | POA: Diagnosis not present

## 2023-09-04 ENCOUNTER — Other Ambulatory Visit: Payer: Self-pay | Admitting: Primary Care

## 2023-09-07 ENCOUNTER — Ambulatory Visit: Payer: BC Managed Care – PPO | Admitting: Primary Care

## 2023-09-19 DIAGNOSIS — E103593 Type 1 diabetes mellitus with proliferative diabetic retinopathy without macular edema, bilateral: Secondary | ICD-10-CM | POA: Diagnosis not present

## 2023-09-21 DIAGNOSIS — G4733 Obstructive sleep apnea (adult) (pediatric): Secondary | ICD-10-CM | POA: Diagnosis not present

## 2023-09-21 DIAGNOSIS — I10 Essential (primary) hypertension: Secondary | ICD-10-CM | POA: Diagnosis not present

## 2023-09-30 DIAGNOSIS — M7989 Other specified soft tissue disorders: Secondary | ICD-10-CM | POA: Insufficient documentation

## 2023-09-30 NOTE — Progress Notes (Unsigned)
Cardiology Office Note:   Date:  09/30/2023  ID:  [name], DOB [birth year], MRN [id number] PCP: Everlean Cherry, MD  [facility] Providers Cardiologist:  Rollene Rotunda, MD {  History of Present Illness:   [name] is a 57 y.o. male who presents for followup of his coronary disease. He is status post multivessel PCI.  He did have a stress test in 2017. He had an equivocal stress test had a cath with proximal mid LAD stents with mild in-stent restenosis.  He had known occlusion of a previously stented obtuse marginal.  The RCA was a dominant vessel with mild stenosis.  He was managed medically.   At the last visit he had leg swelling.  He had an echo which was unremarkable.  ***   ***  he has had lots of problems with cough and fatigue.  He is seeing pulmonary and no diagnosis was forthcoming.  He just had a recent CT.  He had pulmonary function test.  He has had rounds of steroids that will make him feel better for a while.  He has had weight gain.  The had an ankle injury and now he has some increased swelling in his right ankle that was injured but he also has swelling in his left leg as well.  He has not been having enough energy to do much exercise.  He is not having any chest pressure, neck or arm discomfort.  Is not describing PND or orthopnea.  He is not having any new palpitations, presyncope or syncope.  ROS: ***  Studies Reviewed:    EKG:       ***  Risk Assessment/Calculations:   {Does this patient have ATRIAL FIBRILLATION?:[phone]} No BP recorded.  {Refresh Note OR Click here to enter BP  :1}***   STOP-Bang Score:     { Consider Dx Sleep Disordered Breathing or Sleep Apnea  ICD G47.33          :1}     Physical Exam:   VS:  There were no vitals taken for this visit.   Wt Readings from Last 3 Encounters:  05/02/23 238 lb 12.8 oz (108.3 kg)  04/13/23 249 lb 3.2 oz (113 kg)  03/21/23 250 lb (113.4 kg)     GEN: Well nourished, well developed in no  acute distress NECK: No JVD; No carotid bruits CARDIAC: ***RR, *** murmurs, rubs, gallops RESPIRATORY:  Clear to auscultation without rales, wheezing or rhonchi  ABDOMEN: Soft, non-tender, non-distended EXTREMITIES:  No edema; No deformity   ASSESSMENT AND PLAN:   CAD :  ***  The patient has no new sypmtoms.  No further cardiovascular testing is indicated.  We will continue with aggressive risk reduction and meds as listed.   Hypertension : Blood pressure is *** upper limits of normal.  No change in therapy at this point.   Hyperlipidemia : LDL ***  last year was 16.  LPA has been okay.  I will repeat a lipid this year.   DM : A1c is ***  up to 7.4 from 7 this is made worse by his steroids.  I will defer to his primary provider.   Sleep apnea: *** He does use CPAP.   Edema:  ***   I am going to give him Lasix 20 mg daily and check a basic metabolic profile in about 10 days.  He is going to wear compression stocks and keep his feet elevated.  I am going to check an  echocardiogram.  He has some calcification in his pericardium.  I am going to have a low threshold for further testing to include right heart catheterization.     Follow up ***  Signed, Rollene Rotunda, MD

## 2023-10-02 ENCOUNTER — Other Ambulatory Visit: Payer: Self-pay | Admitting: Internal Medicine

## 2023-10-03 ENCOUNTER — Ambulatory Visit: Payer: BC Managed Care – PPO | Attending: Cardiology | Admitting: Cardiology

## 2023-10-03 ENCOUNTER — Encounter: Payer: Self-pay | Admitting: Cardiology

## 2023-10-03 VITALS — BP 130/76 | HR 87 | Ht 71.0 in | Wt 241.2 lb

## 2023-10-03 DIAGNOSIS — I10 Essential (primary) hypertension: Secondary | ICD-10-CM | POA: Diagnosis not present

## 2023-10-03 DIAGNOSIS — E785 Hyperlipidemia, unspecified: Secondary | ICD-10-CM | POA: Diagnosis not present

## 2023-10-03 DIAGNOSIS — M7989 Other specified soft tissue disorders: Secondary | ICD-10-CM

## 2023-10-03 DIAGNOSIS — E118 Type 2 diabetes mellitus with unspecified complications: Secondary | ICD-10-CM | POA: Diagnosis not present

## 2023-10-03 NOTE — Patient Instructions (Addendum)
Medication Instructions:  No changes. *If you need a refill on your cardiac medications before your next appointment, please call your pharmacy*  Labs: BMET today.  Follow-Up: At [facility], you and your health needs are our priority.  As part of our continuing mission to provide you with exceptional heart care, we have created designated Provider Care Teams.  These Care Teams include your primary Cardiologist (physician) and Advanced Practice Providers (APPs -  Physician Assistants and Nurse Practitioners) who all work together to provide you with the care you need, when you need it.  Your next appointment:   6 month(s)  Provider:   Rollene Rotunda, MD

## 2023-10-04 ENCOUNTER — Telehealth: Payer: Self-pay | Admitting: *Deleted

## 2023-10-04 LAB — BASIC METABOLIC PANEL
BUN/Creatinine Ratio: 22 — ABNORMAL HIGH (ref 9–20)
BUN: 19 mg/dL (ref 6–24)
CO2: 23 mmol/L (ref 20–29)
Calcium: 9.8 mg/dL (ref 8.7–10.2)
Chloride: 99 mmol/L (ref 96–106)
Creatinine, Ser: 0.85 mg/dL (ref 0.76–1.27)
Glucose: 177 mg/dL — ABNORMAL HIGH (ref 70–99)
Potassium: 4.4 mmol/L (ref 3.5–5.2)
Sodium: 137 mmol/L (ref 134–144)
eGFR: 101 mL/min/{1.73_m2} (ref >59)

## 2023-10-04 NOTE — Telephone Encounter (Signed)
Left message on voicemail that his ov tomorrow will be a My Chart ov not an in person ov.

## 2023-10-05 ENCOUNTER — Telehealth: Payer: BC Managed Care – PPO | Admitting: Primary Care

## 2023-10-23 DIAGNOSIS — I10 Essential (primary) hypertension: Secondary | ICD-10-CM | POA: Diagnosis not present

## 2023-10-23 DIAGNOSIS — G4733 Obstructive sleep apnea (adult) (pediatric): Secondary | ICD-10-CM | POA: Diagnosis not present

## 2023-10-24 ENCOUNTER — Other Ambulatory Visit: Payer: Self-pay | Admitting: Primary Care

## 2023-10-28 ENCOUNTER — Other Ambulatory Visit: Payer: Self-pay | Admitting: Internal Medicine

## 2023-10-31 ENCOUNTER — Encounter (INDEPENDENT_AMBULATORY_CARE_PROVIDER_SITE_OTHER): Payer: BC Managed Care – PPO | Admitting: Ophthalmology

## 2023-11-01 DIAGNOSIS — E108 Type 1 diabetes mellitus with unspecified complications: Secondary | ICD-10-CM | POA: Diagnosis not present

## 2023-11-04 ENCOUNTER — Other Ambulatory Visit: Payer: Self-pay | Admitting: Primary Care

## 2023-11-06 ENCOUNTER — Other Ambulatory Visit: Payer: Self-pay | Admitting: Internal Medicine

## 2023-11-12 ENCOUNTER — Encounter (INDEPENDENT_AMBULATORY_CARE_PROVIDER_SITE_OTHER): Payer: BC Managed Care – PPO | Admitting: Ophthalmology

## 2023-11-12 DIAGNOSIS — I10 Essential (primary) hypertension: Secondary | ICD-10-CM | POA: Diagnosis not present

## 2023-11-12 DIAGNOSIS — Z794 Long term (current) use of insulin: Secondary | ICD-10-CM | POA: Diagnosis not present

## 2023-11-12 DIAGNOSIS — H35373 Puckering of macula, bilateral: Secondary | ICD-10-CM

## 2023-11-12 DIAGNOSIS — H35033 Hypertensive retinopathy, bilateral: Secondary | ICD-10-CM

## 2023-11-12 DIAGNOSIS — E103593 Type 1 diabetes mellitus with proliferative diabetic retinopathy without macular edema, bilateral: Secondary | ICD-10-CM

## 2023-11-12 DIAGNOSIS — H43812 Vitreous degeneration, left eye: Secondary | ICD-10-CM

## 2023-11-21 ENCOUNTER — Other Ambulatory Visit: Payer: Self-pay | Admitting: Internal Medicine

## 2023-11-23 DIAGNOSIS — I10 Essential (primary) hypertension: Secondary | ICD-10-CM | POA: Diagnosis not present

## 2023-11-23 DIAGNOSIS — G4733 Obstructive sleep apnea (adult) (pediatric): Secondary | ICD-10-CM | POA: Diagnosis not present

## 2023-11-27 ENCOUNTER — Encounter: Payer: Self-pay | Admitting: Primary Care

## 2023-11-27 ENCOUNTER — Ambulatory Visit: Payer: BC Managed Care – PPO | Admitting: Primary Care

## 2023-11-27 VITALS — BP 134/86 | HR 82 | Temp 96.8°F | Ht 71.0 in | Wt 244.2 lb

## 2023-11-27 DIAGNOSIS — J42 Unspecified chronic bronchitis: Secondary | ICD-10-CM | POA: Diagnosis not present

## 2023-11-27 DIAGNOSIS — G4733 Obstructive sleep apnea (adult) (pediatric): Secondary | ICD-10-CM | POA: Diagnosis not present

## 2023-11-27 DIAGNOSIS — J209 Acute bronchitis, unspecified: Secondary | ICD-10-CM | POA: Diagnosis not present

## 2023-11-27 MED ORDER — PROMETHAZINE-DM 6.25-15 MG/5ML PO SYRP: 5.0000 mL | ORAL_SOLUTION | Freq: Four times a day (QID) | ORAL | 0 refills | Status: DC | PRN

## 2023-11-27 MED ORDER — AZITHROMYCIN 250 MG PO TABS: ORAL_TABLET | ORAL | 0 refills | Status: DC

## 2023-11-27 MED ORDER — BENZONATATE 100 MG PO CAPS: 100.0000 mg | ORAL_CAPSULE | Freq: Three times a day (TID) | ORAL | 0 refills | Status: AC | PRN

## 2023-11-27 NOTE — Progress Notes (Signed)
 @Patient  ID: [name], male    DOB: 1966-09-25, 58 y.o.   MRN: [id number]  Chief Complaint  Patient presents with   Follow-up    Referring provider: Magdaline Debby HERO, MD  HPI: 58 year old male, never smoked. PMH significant for questionable asthma, recurrent bronchitis, OSA, lung nodule, CAD, NSTEMI, HTN, type 1 diabetes, hypothyroidism, lipidemia. Patient of Dr. Neda. Patient follows with cardiology for OSA.  Previous [facility] pulmonary encounter:  03/09/2023 Patient presents today for follow-up pneumonia.  Patient has been seen by urgent care 3-4 times in the last several months for asthma/recurrent bronchitis.  He has been treated several times with antibiotics and prednisone .  He was most recently seen by urgent care on 02/21/2023 and diagnosed with community-acquired pneumonia right lower lung.  He was prescribed Augmentin  for 7 days along with ipratropium albuterol  nebulizer.  He reports some temporary improvement however symptoms returned shortly after completing antibiotics and steroids.  Nothing has particularly helped.  Daughter is concerned as patient has not returned to baseline and symptoms have been persistent since February.  He has coughing fits several times a day.  He chokes easily on his phlegm.  Cough is congested but nonproductive.  He gets tired easily and needs to stop to rest after walking short distances.  Since have been ongoing since February and are not improving.  He was using Trelegy up until he was given ipratropium albuterol  nebulizers.  He has been using nebulizer 3-4 times a day with him short term relief.  He has tried Tessalon  Perles for cough without significant improvement.  Pulmonary function testing in December 2022 was normal.  Has had not had recent CT imaging of his chest.  Reports compliance with CPAP nightly, cardiology managing.  03/21/2023 Presents today for 2-week follow-up.  He is feeling better. Cough has for the most part subsided. He still has  some mild wheezing symptoms. He responds well to prednisone , has just a couple days left of taper. He is taking Trelegy 100mcg as prescribed and singulair  10mg  at bedtime. Flutter valve has helped. IGE 145, Eos 600. CT chest showed no acute findings, mild central airway thickening without focal endobronchial lesion or airspace disease. Chronic pericardial calcifications and extensive coronary artery arthrosclerosis. Reports compliance with CPAP, has been unable to wear several nights due to cough but recently resumed- cardiology is managing.    Allergic asthma, moderate persistent, uncomplicated - Symptoms are consistent with moderate persistent allergic asthma with recurrent bronchitis  - Eosinophils and IGE were elevated indicating an allergic component - PFTs were normal in 2022 / FEV1 84% predicated, increased diffusion capacity. No BD response.  - CT chest 5/24 showed no acute process, mild airway thickening  - Recommend limiting oral steroids use as much as possible due to type 1 diabetes - If still having uncontrolled asthma symptoms we will discuss adding biologics at follow-up  Recommendations: Start Trelegy one puff daily in the morning (sample and RX sent) Start mucinex-dm twice daily to loosen congestion/cough Start fluticasone  nasal spray 1 spray per nostril daily  Continue to use flutter valve three times a day  Continue Singulair  10mg  daily   Coronary artery disease - Advised patient follow-up with cardiology due to extensive coronary artery disease seen on CT imaging and dyspnea symptoms  - Continue Crestor  5mg  daily   OSA on CPAP - Patient reports compliance, unable to wear several nights due to cough but recently resumed use. Cardiology managing.    05/02/2023 Patient presents today for 1  month follow-up. He is doing very well, feels 90% better. His breathing has been significantly better, he did well on Trelegy sample. Needs prescription sent to pharmacy. He  has noticed less wheezing. He has not needed oral prednisone  in the last month. He is using fluticasone  nasal spray daily and taking mucinex as needed to loosen congestion. He has been able to exercise and is working on weight loss. He saw cardiology, ordered for echocardiogram tomorrow. He is wearing CPAP consistently every night.   11/27/2023- Interim hx  Discussed the use of AI scribe software for clinical note transcription with the patient, who gave verbal consent to proceed.  History of Present Illness   [name] is a 58 year old male with sleep apnea and asthma who presents for a three month follow-up.   He has experienced a recent increase in coughing over the past four days, which is particularly severe at night. His wife has noticed the cough, although he did not initially. He uses promethazine  DM, which he feels helps manage his symptoms and Tessalon  Perles, especially at night, to alleviate the cough. He is concerned about the potential for his cough to develop into bronchitis, as it has in the past. No fever, chills, nausea, vomiting, or diarrhea.  He has a history of asthma and is currently using Trelegy 200 mcg, one puff daily. He reports significant improvement in asthma symptoms since starting higher dose ICS. Due to severe coughing spells, he has needed to use his albuterol  inhaler in the past two to three days. He also takes Singulair . He reports a dry cough with occasional minimal sputum production, some postnasal drip, and no recent wheezing.  His use of the CPAP machine has been inconsistent over the past week due to being on vacation and his wife's recent illness. He notes a difference in sleep quality when not using the CPAP machine, but his sleep has been disrupted by coughing.  His wife recently had the flu about two weeks ago, but he has not experienced similar symptoms.     Airview download 08/27/2023 - 11/24/2023 Usage days 29/90 days (32%) Average usage 4  hours 42 minutes Pressure 4 to 15 cm H2O (10.1 cm H2O-95%) Air leaks 14.7 L/min (95%) AHI 3.1  No Known Allergies  Immunization History  Administered Date(s) Administered   Influenza Inj Mdck Quad Pf 08/09/2015, 07/16/2019   Influenza Split 08/09/2015   Influenza, Seasonal, Injecte, Preservative Fre 09/05/2016, 09/24/2023   Influenza,inj,Quad PF,6-35 Mos 08/16/2017   Influenza,inj,quad, With Preservative 08/16/2017   Influenza-Unspecified 07/23/2013, 09/05/2016, 08/16/2017, 07/23/2021   Moderna Sars-Covid-2 Vaccination 09/06/2020, 05/27/2021   Pneumococcal Conjugate-13 11/19/2013   Pneumococcal Polysaccharide-23 06/30/2009   Tdap 05/21/2012    Past Medical History:  Diagnosis Date   Coronary artery disease    s/p NSTEMI after ETT-echo - [facility] 7/13:  LM < 10%, pLAD 90%, mLAD 95%, pCFX tandem 90%, mCFX 80-90%, pRCA 30%, mRCA 40-50%;  PCI: Xience DES x 2 to prox and mid LAD; Xience DES x 1 to prox and mid CFX   Family history of anesthesia complication    nausea    GERD (gastroesophageal reflux disease)    HLD (hyperlipidemia)    Hypertension    Lung nodule 2013   CT 7/13: 3 mm RML nodule - felt to be stable and benign/ follow up in 2014   Myocardial infarction [facility]) 2013   have 4 stents   S/P coronary artery stent placement    Type I diabetes  mellitus (HCC)    insulin  dependent    Tobacco History: Social History   Tobacco Use  Smoking Status Never  Smokeless Tobacco Never   Counseling given: Not Answered   Outpatient Medications Prior to Visit  Medication Sig Dispense Refill   ACCU-CHEK AVIVA PLUS test strip 1 each by Other route as needed.      albuterol  (PROVENTIL  HFA;VENTOLIN  HFA) 108 (90 Base) MCG/ACT inhaler Inhale 1 puff into the lungs every 6 (six) hours as needed for wheezing or shortness of breath.     aspirin  81 MG tablet Take 81 mg by mouth daily.     azelastine (ASTELIN) 0.1 % nasal spray Place 1 spray into the nose daily.     benzonatate  (TESSALON ) 100  MG capsule      Coenzyme Q10 100 MG capsule Take by mouth.     Continuous Blood Gluc Sensor (DEXCOM G6 SENSOR) MISC      doxycycline  (VIBRA -TABS) 100 MG tablet Take 1 tablet (100 mg total) by mouth 2 (two) times daily. 14 tablet 0   fluticasone  (FLONASE ) 50 MCG/ACT nasal spray Place 1 spray into both nostrils daily. 16 g 3   Fluticasone -Umeclidin-Vilant (TRELEGY ELLIPTA ) 200-62.5-25 MCG/ACT AEPB Inhale 1 puff into the lungs daily. 2 each 0   furosemide  (LASIX ) 20 MG tablet Take 1 tablet (20 mg total) by mouth daily. 90 tablet 3   insulin  aspart (NOVOLOG  FLEXPEN) 100 UNIT/ML FlexPen To be inject 3-4 times daily for meals or snacks at directed, up to 100 units daily.     ipratropium-albuterol  (DUONEB) 0.5-2.5 (3) MG/3ML SOLN Take 3 mLs by nebulization every 6 (six) hours as needed. 120 mL 1   isosorbide  mononitrate (IMDUR ) 60 MG 24 hr tablet TAKE 1 TABLET(60 MG) BY MOUTH DAILY 90 tablet 2   losartan  (COZAAR ) 25 MG tablet TAKE 1 TABLET(25 MG) BY MOUTH DAILY 90 tablet 1   montelukast  (SINGULAIR ) 10 MG tablet TAKE 1 TABLET(10 MG) BY MOUTH AT BEDTIME 90 tablet 3   Multiple Vitamins-Minerals (AIRBORNE GUMMIES) CHEW Chew 3 capsules by mouth daily.     nitroGLYCERIN  (NITROSTAT ) 0.4 MG SL tablet DISSOLVE 1 TABLET UNDER TONGUE EVERY 5 MINUTES AS NEEDED FOR CHEST PAIN AS DIRECTED 25 tablet 1   omeprazole (PRILOSEC) 20 MG capsule Take 20 mg by mouth daily.     predniSONE  (DELTASONE ) 10 MG tablet Take 2 tablets daily x 5 days; then 1 tablet daily x 10 days; then stop 20 tablet 0   promethazine -dextromethorphan (PROMETHAZINE -DM) 6.25-15 MG/5ML syrup Take 5 mLs by mouth 4 (four) times daily as needed for cough. 240 mL 0   REPATHA  140 MG/ML SOSY INJECT 140 MG EVERY 14 DAYS 6 mL 3   rosuvastatin  (CRESTOR ) 5 MG tablet TAKE 1 TABLET(5 MG) BY MOUTH DAILY AT 6 PM 90 tablet 1   TRELEGY ELLIPTA  200-62.5-25 MCG/ACT AEPB INHALE 1 PUFF INTO THE LUNGS DAILY 60 each 5   No facility-administered medications prior to visit.    Review of Systems  Review of Systems  Constitutional: Negative.   HENT:  Positive for congestion.   Respiratory:  Positive for cough.   Cardiovascular: Negative.    Physical Exam  BP 134/86 (BP Location: Right Arm, Patient Position: Sitting, Cuff Size: Large)   Pulse 82   Temp (!) 96.8 F (36 C) (Temporal)   Ht 5' 11 (1.803 m)   Wt 244 lb 3.2 oz (110.8 kg)   SpO2 97%   BMI 34.06 kg/m  Physical Exam Constitutional:  General: He is not in acute distress.    Appearance: Normal appearance. He is not ill-appearing.  HENT:     Head: Normocephalic and atraumatic.     Right Ear: Tympanic membrane normal.     Left Ear: Tympanic membrane normal.     Mouth/Throat:     Mouth: Mucous membranes are moist.     Pharynx: Oropharynx is clear.  Cardiovascular:     Rate and Rhythm: Normal rate and regular rhythm.  Pulmonary:     Effort: Pulmonary effort is normal.     Breath sounds: Normal breath sounds. No wheezing or rhonchi.  Skin:    General: Skin is warm and dry.  Neurological:     General: No focal deficit present.     Mental Status: He is alert and oriented to person, place, and time. Mental status is at baseline.  Psychiatric:        Mood and Affect: Mood normal.        Behavior: Behavior normal.        Thought Content: Thought content normal.        Judgment: Judgment normal.      Lab Results:  CBC    Component Value Date/Time   WBC 8.4 03/09/2023 1613   RBC 4.84 03/09/2023 1613   HGB 14.9 03/09/2023 1613   HGB 14.8 06/24/2021 1352   HCT 44.5 03/09/2023 1613   HCT 42.9 06/24/2021 1352   PLT 339 03/09/2023 1613   PLT 299 06/24/2021 1352   MCV 91.9 03/09/2023 1613   MCV 93 06/24/2021 1352   MCH 30.8 03/09/2023 1613   MCHC 33.5 03/09/2023 1613   RDW 13.0 03/09/2023 1613   RDW 12.2 06/24/2021 1352   LYMPHSABS 2,150 03/09/2023 1613   MONOABS 0.8 11/13/2022 1024   EOSABS 630 (H) 03/09/2023 1613   BASOSABS 76 03/09/2023 1613    BMET    Component Value  Date/Time   NA 137 10/03/2023 0934   K 4.4 10/03/2023 0934   CL 99 10/03/2023 0934   CO2 23 10/03/2023 0934   GLUCOSE 177 (H) 10/03/2023 0934   GLUCOSE 170 (H) 11/13/2022 1024   BUN 19 10/03/2023 0934   CREATININE 0.85 10/03/2023 0934   CALCIUM  9.8 10/03/2023 0934   GFRNONAA 102 10/13/2019 1017   GFRAA 118 10/13/2019 1017    BNP No results found for: BNP  ProBNP    Component Value Date/Time   PROBNP 30 05/18/2021 1701    Imaging: No results found.   Assessment & Plan:   1. OSA on CPAP (Primary)  2. Chronic bronchitis with acute exacerbation (HCC)     Moderate persistent allergic asthma with recurrent bronchitis  -Eosinophils and IGE were elevated indicating an allergic component -PFTs were normal in 2022 / FEV1 84% predicated, increased diffusion capacity. No BD response.  -CT chest 5/24 showed no acute process, mild airway thickening  -Recommend limiting oral steroids use as much as possible due to type 1 diabetes  Recent increase in coughing, particularly at night, with some postnasal drip. No wheezing noted on exam. Patient has been using albuterol  and Tessalon  Perles for symptom relief.Continue Trelegy 200mcg one puff daily and Singulair . Refill Tessalon  Perles and promethazine  DM for cough. Prescribe Z-Pak to address potential infection contributing to cough  Sleep Apnea Patient reports benefit from CPAP but has had intermittent use recently due to a vacation and coughing -Encourage consistent use of CPAP nightly 4-6 hours  -Continue current pressure settings as apnea is well-controlled.  Follow-up  in 6 months unless symptoms worsen or new issues arise.           Almarie LELON Ferrari, NP 11/27/2023

## 2023-11-27 NOTE — Patient Instructions (Addendum)
-  ASTHMA: Asthma is a condition where your airways narrow and swell, producing extra mucus, which can make breathing difficult. You have experienced an increase in coughing, especially at night, with some postnasal drip. We will continue your current medications, Trelegy 200mcg (one puff daily) and Singulair . We have also refilled your Tessalon  Perles and promethazine  DM for cough relief and prescribed a Z-Pak to address any potential infection contributing to your cough.  -SLEEP APNEA: Sleep apnea is a sleep disorder where breathing repeatedly stops and starts. Your condition is well-controlled with your CPAP machine, but you have had inconsistent use recently due to vacation and coughing. Please try to use your CPAP machine consistently to maintain good sleep quality. We will continue with your current pressure settings as your apnea is well-controlled.  INSTRUCTIONS:  Please follow up in 6 months unless your symptoms worsen or new issues arise.

## 2023-12-03 ENCOUNTER — Telehealth: Payer: Self-pay

## 2023-12-03 ENCOUNTER — Other Ambulatory Visit: Payer: Self-pay | Admitting: Primary Care

## 2023-12-03 DIAGNOSIS — J209 Acute bronchitis, unspecified: Secondary | ICD-10-CM | POA: Diagnosis not present

## 2023-12-03 DIAGNOSIS — R059 Cough, unspecified: Secondary | ICD-10-CM | POA: Diagnosis not present

## 2023-12-03 DIAGNOSIS — R051 Acute cough: Secondary | ICD-10-CM | POA: Diagnosis not present

## 2023-12-03 DIAGNOSIS — J42 Unspecified chronic bronchitis: Secondary | ICD-10-CM | POA: Diagnosis not present

## 2023-12-10 ENCOUNTER — Other Ambulatory Visit (HOSPITAL_COMMUNITY): Payer: Self-pay

## 2023-12-10 ENCOUNTER — Telehealth: Payer: Self-pay | Admitting: Pharmacy Technician

## 2023-12-10 NOTE — Telephone Encounter (Signed)
Pharmacy Patient Advocate Encounter   Received notification from CoverMyMeds that prior authorization for Repatha is required/requested.   Insurance verification completed.   The patient is insured through [employer]  .   Per test claim: PA required; PA submitted to above mentioned insurance via CoverMyMeds Key/confirmation #/EOC [id number] Status is pending

## 2023-12-10 NOTE — Telephone Encounter (Addendum)
Pharmacy Patient Advocate Encounter  Received notification from [facility] [place]  that Prior Authorization for Repatha has been APPROVED from 12/10/23 to 12/09/24. Ran test claim, Copay is $50.00- one month. This test claim was processed through [facility]- copay amounts may vary at other pharmacies due to pharmacy/plan contracts, or as the patient moves through the different stages of their insurance plan.   PA #/Case ID/Reference #: [id number]

## 2023-12-14 NOTE — Telephone Encounter (Signed)
Continue Trelegy one puff daily, Singulair and promethazine DM for cough. He was prescribe Z-Pak earlier this month. Does he have any post nasal drip? Would try flonase nasal spray and/or ocean saline rinses to address that if currently experiencing    We can send in budesonide nebulizer 0.5mg /2ML to use twice daily prn wheezing #50ml   We may want to follow in office again to discuss starting biologics for his asthma

## 2023-12-14 NOTE — Telephone Encounter (Signed)
 Any recommendations?

## 2023-12-15 DIAGNOSIS — J42 Unspecified chronic bronchitis: Secondary | ICD-10-CM | POA: Diagnosis not present

## 2023-12-15 DIAGNOSIS — R051 Acute cough: Secondary | ICD-10-CM | POA: Diagnosis not present

## 2023-12-15 DIAGNOSIS — J209 Acute bronchitis, unspecified: Secondary | ICD-10-CM | POA: Diagnosis not present

## 2023-12-16 MED ORDER — BUDESONIDE 0.5 MG/2ML IN SUSP: 0.5000 mg | Freq: Two times a day (BID) | RESPIRATORY_TRACT | 12 refills | Status: DC | PRN

## 2023-12-20 DIAGNOSIS — Z1331 Encounter for screening for depression: Secondary | ICD-10-CM | POA: Diagnosis not present

## 2023-12-20 DIAGNOSIS — J4531 Mild persistent asthma with (acute) exacerbation: Secondary | ICD-10-CM | POA: Diagnosis not present

## 2023-12-20 DIAGNOSIS — E10311 Type 1 diabetes mellitus with unspecified diabetic retinopathy with macular edema: Secondary | ICD-10-CM | POA: Diagnosis not present

## 2023-12-20 DIAGNOSIS — E103593 Type 1 diabetes mellitus with proliferative diabetic retinopathy without macular edema, bilateral: Secondary | ICD-10-CM | POA: Diagnosis not present

## 2023-12-20 DIAGNOSIS — R062 Wheezing: Secondary | ICD-10-CM | POA: Diagnosis not present

## 2023-12-20 DIAGNOSIS — R0602 Shortness of breath: Secondary | ICD-10-CM | POA: Diagnosis not present

## 2023-12-20 DIAGNOSIS — E8881 Metabolic syndrome: Secondary | ICD-10-CM | POA: Diagnosis not present

## 2023-12-20 DIAGNOSIS — R051 Acute cough: Secondary | ICD-10-CM | POA: Diagnosis not present

## 2023-12-21 DIAGNOSIS — G4733 Obstructive sleep apnea (adult) (pediatric): Secondary | ICD-10-CM | POA: Diagnosis not present

## 2023-12-21 DIAGNOSIS — I10 Essential (primary) hypertension: Secondary | ICD-10-CM | POA: Diagnosis not present

## 2024-01-26 DIAGNOSIS — I10 Essential (primary) hypertension: Secondary | ICD-10-CM | POA: Diagnosis not present

## 2024-01-26 DIAGNOSIS — G4733 Obstructive sleep apnea (adult) (pediatric): Secondary | ICD-10-CM | POA: Diagnosis not present

## 2024-02-13 ENCOUNTER — Other Ambulatory Visit: Payer: Self-pay | Admitting: Cardiology

## 2024-02-15 DIAGNOSIS — E108 Type 1 diabetes mellitus with unspecified complications: Secondary | ICD-10-CM | POA: Diagnosis not present

## 2024-02-25 DIAGNOSIS — I10 Essential (primary) hypertension: Secondary | ICD-10-CM | POA: Diagnosis not present

## 2024-02-25 DIAGNOSIS — G4733 Obstructive sleep apnea (adult) (pediatric): Secondary | ICD-10-CM | POA: Diagnosis not present

## 2024-02-29 DIAGNOSIS — J4531 Mild persistent asthma with (acute) exacerbation: Secondary | ICD-10-CM | POA: Diagnosis not present

## 2024-02-29 DIAGNOSIS — J42 Unspecified chronic bronchitis: Secondary | ICD-10-CM | POA: Diagnosis not present

## 2024-02-29 DIAGNOSIS — R051 Acute cough: Secondary | ICD-10-CM | POA: Diagnosis not present

## 2024-02-29 DIAGNOSIS — J209 Acute bronchitis, unspecified: Secondary | ICD-10-CM | POA: Diagnosis not present

## 2024-03-12 ENCOUNTER — Encounter: Payer: Self-pay | Admitting: Cardiology

## 2024-03-19 ENCOUNTER — Encounter (HOSPITAL_BASED_OUTPATIENT_CLINIC_OR_DEPARTMENT_OTHER): Payer: Self-pay

## 2024-03-19 ENCOUNTER — Ambulatory Visit (HOSPITAL_BASED_OUTPATIENT_CLINIC_OR_DEPARTMENT_OTHER)
Admission: EM | Admit: 2024-03-19 | Discharge: 2024-03-19 | Disposition: A | Discharge status: Home / Self Care | Attending: Family Medicine | Admitting: Family Medicine

## 2024-03-19 ENCOUNTER — Ambulatory Visit (INDEPENDENT_AMBULATORY_CARE_PROVIDER_SITE_OTHER)
Admit: 2024-03-19 | Discharge: 2024-03-19 | Disposition: A | Discharge status: Home / Self Care | Attending: Family Medicine | Admitting: Family Medicine

## 2024-03-19 DIAGNOSIS — R062 Wheezing: Secondary | ICD-10-CM

## 2024-03-19 DIAGNOSIS — J42 Unspecified chronic bronchitis: Secondary | ICD-10-CM | POA: Diagnosis not present

## 2024-03-19 DIAGNOSIS — J189 Pneumonia, unspecified organism: Secondary | ICD-10-CM

## 2024-03-19 DIAGNOSIS — R0602 Shortness of breath: Secondary | ICD-10-CM

## 2024-03-19 DIAGNOSIS — J209 Acute bronchitis, unspecified: Secondary | ICD-10-CM | POA: Diagnosis not present

## 2024-03-19 MED ORDER — AMOXICILLIN-POT CLAVULANATE 875-125 MG PO TABS: 1.0000 | ORAL_TABLET | Freq: Two times a day (BID) | ORAL | 0 refills | Status: DC

## 2024-03-19 MED ORDER — PROMETHAZINE-DM 6.25-15 MG/5ML PO SYRP: 5.0000 mL | ORAL_SOLUTION | Freq: Four times a day (QID) | ORAL | 0 refills | Status: DC | PRN

## 2024-03-19 MED ORDER — PREDNISONE 20 MG PO TABS: 40.0000 mg | ORAL_TABLET | Freq: Every day | ORAL | 0 refills | Status: AC

## 2024-03-19 MED ORDER — TRIAMCINOLONE ACETONIDE 40 MG/ML IJ SUSP
40.0000 mg | Freq: Once | INTRAMUSCULAR | Status: AC
  Administered 2024-03-19: 40 mg via INTRAMUSCULAR

## 2024-03-19 NOTE — ED Triage Notes (Signed)
 Cough and wheezing x 2 weeks. Patient has hx of frequent upper respiratory infections and sees a pulmonologist. When on steroids and breathing treatments, things improve but then return a few weeks later.

## 2024-03-19 NOTE — Discharge Instructions (Signed)
 I will send your x results to your mychart tomorrow. Kenalog  injection given here and you can start the prednisone  tomorrow.  Start zyrtec in the am and continue the Singulair  at bedtime.  You can start Pepcid also during the day to see if this helps.  Recommend see ENT as planned and maybe an allergist.

## 2024-03-20 DIAGNOSIS — E8881 Metabolic syndrome: Secondary | ICD-10-CM | POA: Diagnosis not present

## 2024-03-20 DIAGNOSIS — J069 Acute upper respiratory infection, unspecified: Secondary | ICD-10-CM | POA: Diagnosis not present

## 2024-03-20 DIAGNOSIS — E103593 Type 1 diabetes mellitus with proliferative diabetic retinopathy without macular edema, bilateral: Secondary | ICD-10-CM | POA: Diagnosis not present

## 2024-03-20 NOTE — ED Provider Notes (Signed)
 [name] CARE    CSN: [id number] Arrival date & time: 03/19/24  1745      History   Chief Complaint Chief Complaint  Patient presents with   Cough   Shortness of Breath    HPI [name] is a 58 y.o. male.   58 year old male with past medical history of chronic bronchitis, OSA on CPAP presents today with cough, wheezing and shortness of breath.  This has been present for approximate 2 weeks.  This is a second bout of this he has had in the past 6 weeks or so.  He was recently treated with steroid, antibiotics, breathing treatments and steroid injection and felt better for approximate 1 week and then the symptoms returned.  He does have allergies.  He takes montelukast  but is not taking any other daily male allergy  medication.  Does have history of GERD but not on any kind of medication for this.  Reports he has had a harsh cough.  Denies any fevers, chills, body aches or night sweats.  He has had some fatigue.Hx of PNA.   Cough Associated symptoms: shortness of breath   Shortness of Breath Associated symptoms: cough     Past Medical History:  Diagnosis Date   Coronary artery disease    s/p NSTEMI after ETT-echo - [facility] 7/13:  LM < 10%, pLAD 90%, mLAD 95%, pCFX tandem 90%, mCFX 80-90%, pRCA 30%, mRCA 40-50%;  PCI: Xience DES x 2 to prox and mid LAD; Xience DES x 1 to prox and mid CFX   Family history of anesthesia complication    nausea    GERD (gastroesophageal reflux disease)    HLD (hyperlipidemia)    Hypertension    Lung nodule 2013   CT 7/13: 3 mm RML nodule - felt to be stable and benign/ follow up in 2014   Myocardial infarction [facility]) 2013   have 4 stents   S/P coronary artery stent placement    Type I diabetes mellitus (HCC)    insulin  dependent    Patient Active Problem List   Diagnosis Date Noted   Leg swelling 09/30/2023   Allergic asthma, moderate persistent, uncomplicated 03/21/2023   OSA on CPAP 03/21/2023   Chronic bronchitis with acute  exacerbation (HCC) 03/09/2023   Abnormal stress test 10/14/2019   Dyslipidemia 09/24/2019   Type I diabetes mellitus (HCC)    Hypothyroidism    HLD (hyperlipidemia)    Family history of anesthesia complication    Dyspnea    Coronary artery disease    Macular hole, right 08/01/2016   Preretinal fibrosis, 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   Proliferative retinopathy due to DM (HCC) 09/16/2013   Leg pain, bilateral 05/31/2012   Coronary Artery Disease 05/20/2012   Lung nodule 05/04/2012   Hyperlipidemia 05/03/2012   NSTEMI (non-ST elevated myocardial infarction) (HCC) 05/03/2012   Abnormal EKG 05/02/2012   IDDM (insulin  dependent diabetes mellitus) 05/02/2012   Overweight 05/02/2012   Hypertension    Vitreous hemorrhage (HCC) 03/28/2012   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   Proliferative diabetic retinopathy (HCC) 03/28/2012    Past Surgical History:  Procedure Laterality Date   25 GAUGE PARS PLANA VITRECTOMY WITH 20 GAUGE MVR PORT FOR MACULAR HOLE Right 08/01/2016   25 GAUGE PARS PLANA VITRECTOMY WITH 20 GAUGE MVR PORT FOR MACULAR HOLE, MEMBRANE PEEL AND SERUM PATCH WITH ENDOLASER AND C3F8    25 GAUGE PARS PLANA VITRECTOMY WITH 20 GAUGE MVR PORT FOR MACULAR HOLE Right 08/01/2016   Procedure: 25 GAUGE PARS  PLANA VITRECTOMY WITH 20 GAUGE MVR PORT FOR MACULAR HOLE, MEMBRANE PEEL AND SERUM PATCH WITH ENDOLASER AND C3F8;  Surgeon: Rexene Catching, MD;  Location: [facility] OR;  Service: Ophthalmology;  Laterality: Right;   APPENDECTOMY  1980's   CATARACT EXTRACTION  07/2017   right eye   CHOLECYSTECTOMY  ~ 2006   COLONOSCOPY     CORONARY ANGIOPLASTY WITH STENT PLACEMENT  04/2013   LAD   GAS/FLUID EXCHANGE Right 09/16/2013   Procedure: AIR/FLUID EXCHANGE;  Surgeon: Rexene Catching, MD;  Location: [facility] OR;  Service: Ophthalmology;  Laterality: Right;   GAS/FLUID EXCHANGE Right 08/01/2016   Procedure: GAS/FLUID EXCHANGE;  Surgeon: Rexene Catching, MD;  Location: [facility] OR;  Service: Ophthalmology;   Laterality: Right;   KNEE ARTHROSCOPY Right ~ 1984   LEFT HEART CATH AND CORONARY ANGIOGRAPHY N/A 10/14/2019   Procedure: LEFT HEART CATH AND CORONARY ANGIOGRAPHY;  Surgeon: Swaziland, Peter M, MD;  Location: [facility] INVASIVE CV LAB;  Service: Cardiovascular;  Laterality: N/A;   LEFT HEART CATH AND CORONARY ANGIOGRAPHY N/A 07/01/2021   Procedure: LEFT HEART CATH AND CORONARY ANGIOGRAPHY;  Surgeon: Odie Benne, MD;  Location: [facility] INVASIVE CV LAB;  Service: Cardiovascular;  Laterality: N/A;   LEFT HEART CATHETERIZATION WITH CORONARY ANGIOGRAM N/A 05/03/2012   Procedure: LEFT HEART CATHETERIZATION WITH CORONARY ANGIOGRAM;  Surgeon: Peter M Swaziland, MD;  Location: [facility] CATH LAB;  Service: Cardiovascular;  Laterality: N/A;   MEMBRANE PEEL Right 09/16/2013   Procedure: MEMBRANE PEEL;  Surgeon: Rexene Catching, MD;  Location: [facility] OR;  Service: Ophthalmology;  Laterality: Right;   PARS PLANA VITRECTOMY Right 09/16/2013   w/membrane peel/notes 11/25/20145   PARS PLANA VITRECTOMY Right 09/16/2013   Procedure: PARS PLANA VITRECTOMY WITH 25 GAUGE;  Surgeon: Rexene Catching, MD;  Location: [facility] OR;  Service: Ophthalmology;  Laterality: Right;   PHOTOCOAGULATION WITH LASER Right 09/16/2013   Procedure: PHOTOCOAGULATION WITH LASER;  Surgeon: Rexene Catching, MD;  Location: [facility] OR;  Service: Ophthalmology;  Laterality: Right;  ENDOLASER   POLYPECTOMY     SERUM PATCH Right 08/01/2016   Procedure: SERUM PATCH;  Surgeon: Rexene Catching, MD;  Location: [facility] OR;  Service: Ophthalmology;  Laterality: Right;   TONSILLECTOMY AND ADENOIDECTOMY  1970's       Home Medications    Prior to Admission medications   Medication Sig Start Date End Date Taking? Authorizing Provider  amoxicillin -clavulanate (AUGMENTIN ) 875-125 MG tablet Take 1 tablet by mouth every 12 (twelve) hours. 03/19/24  Yes Taegen Delker A, FNP  predniSONE  (DELTASONE ) 20 MG tablet Take 2 tablets (40 mg total) by mouth daily with breakfast for 5 days. 03/19/24  03/24/24 Yes Meridian Scherger, Verdel Gitelman, FNP  ACCU-CHEK AVIVA PLUS test strip 1 each by Other route as needed.  05/07/12   [provider]  albuterol  (PROVENTIL  HFA;VENTOLIN  HFA) 108 (90 Base) MCG/ACT inhaler Inhale 1 puff into the lungs every 6 (six) hours as needed for wheezing or shortness of breath.    [provider]  aspirin  81 MG tablet Take 81 mg by mouth daily.    [provider]  azelastine (ASTELIN) 0.1 % nasal spray Place 1 spray into the nose daily. 01/25/19   [provider]  benzonatate  (TESSALON ) 100 MG capsule Take 1 capsule (100 mg total) by mouth 3 (three) times daily as needed for cough. 11/27/23   Antonio Baumgarten, NP  budesonide  (PULMICORT ) 0.5 MG/2ML nebulizer solution Take 2 mLs (0.5 mg total) by nebulization 2 (two) times  daily as needed (wheezing). 12/16/23   Antonio Baumgarten, NP  Coenzyme Q10 100 MG capsule Take by mouth.    [provider]  Continuous Blood Gluc Sensor (DEXCOM G6 SENSOR) MISC  07/20/19   [provider]  fluticasone  (FLONASE ) 50 MCG/ACT nasal spray SHAKE LIQUID AND USE 1 SPRAY IN EACH NOSTRIL DAILY 12/03/23   Antonio Baumgarten, NP  Fluticasone -Umeclidin-Vilant (TRELEGY ELLIPTA ) 200-62.5-25 MCG/ACT AEPB Inhale 1 puff into the lungs daily. 05/02/23   Antonio Baumgarten, NP  furosemide  (LASIX ) 20 MG tablet Take 1 tablet (20 mg total) by mouth daily. 04/13/23   Eilleen Grates, MD  insulin  aspart (NOVOLOG  FLEXPEN) 100 UNIT/ML FlexPen To be inject 3-4 times daily for meals or snacks at directed, up to 100 units daily. 06/30/21   [provider]  ipratropium-albuterol  (DUONEB) 0.5-2.5 (3) MG/3ML SOLN Take 3 mLs by nebulization every 6 (six) hours as needed. 03/16/23   Antonio Baumgarten, NP  isosorbide  mononitrate (IMDUR ) 60 MG 24 hr tablet TAKE 1 TABLET(60 MG) BY MOUTH DAILY 11/07/23   Hilty, Aviva Lemmings, MD  losartan  (COZAAR ) 25 MG tablet TAKE 1 TABLET(25 MG) BY MOUTH DAILY 10/02/23   Eilleen Grates, MD  montelukast   (SINGULAIR ) 10 MG tablet TAKE 1 TABLET(10 MG) BY MOUTH AT BEDTIME 09/04/23   Antonio Baumgarten, NP  Multiple Vitamins-Minerals (AIRBORNE GUMMIES) CHEW Chew 3 capsules by mouth daily.    [provider]  nitroGLYCERIN  (NITROSTAT ) 0.4 MG SL tablet DISSOLVE 1 TABLET UNDER TONGUE EVERY 5 MINUTES AS NEEDED FOR CHEST PAIN AS DIRECTED 02/14/24   Eilleen Grates, MD  promethazine -dextromethorphan (PROMETHAZINE -DM) 6.25-15 MG/5ML syrup Take 5 mLs by mouth 4 (four) times daily as needed for cough. 03/19/24   Landa Pine, FNP  REPATHA  140 MG/ML SOSY INJECT 140 MG EVERY 14 DAYS 11/22/23   Hilty, Aviva Lemmings, MD  rosuvastatin  (CRESTOR ) 5 MG tablet TAKE 1 TABLET(5 MG) BY MOUTH DAILY AT 6 PM 10/29/23   Eilleen Grates, MD  TRELEGY ELLIPTA  200-62.5-25 MCG/ACT AEPB INHALE 1 PUFF INTO THE LUNGS DAILY 11/01/23   Antonio Baumgarten, NP    Family History Family History  Problem Relation Age of Onset   Retinitis pigmentosa Father    Coronary artery disease Brother 67       Died   Heart attack Brother    Heart disease Brother    Hypertension Mother    Coronary artery disease Maternal Uncle 23       Died   Coronary artery disease Maternal Grandmother 10       Died   Aneurysm Maternal Grandfather 85       Abdominal   Aneurysm Daughter 16       Cerebral   Colon polyps Neg Hx    Colon cancer Neg Hx    Esophageal cancer Neg Hx    Stomach cancer Neg Hx    Rectal cancer Neg Hx     Social History Social History   Tobacco Use   Smoking status: Never   Smokeless tobacco: Never  Vaping Use   Vaping status: Never Used  Substance Use Topics   Alcohol use: Yes    Comment: rare   Drug use: No     Allergies   Patient has no known allergies.   Review of Systems Review of Systems  Respiratory:  Positive for cough and shortness of breath.      Physical Exam Triage Vital Signs ED Triage Vitals  Encounter Vitals Group  BP 03/19/24 1813 (!) 152/82     Systolic BP Percentile --      Diastolic  BP Percentile --      Pulse Rate 03/19/24 1813 91     Resp 03/19/24 1813 (!) 21     Temp 03/19/24 1813 98.3 F (36.8 C)     Temp Source 03/19/24 1813 Oral     SpO2 03/19/24 1813 95 %     Weight --      Height --      Head Circumference --      Peak Flow --      Pain Score 03/19/24 1816 0     Pain Loc --      Pain Education --      Exclude from Growth Chart --    No data found.  Updated Vital Signs BP (!) 152/82 (BP Location: Right Arm)   Pulse 91   Temp 98.3 F (36.8 C) (Oral)   Resp (!) 21   SpO2 95%   Visual Acuity Right Eye Distance:   Left Eye Distance:   Bilateral Distance:    Right Eye Near:   Left Eye Near:    Bilateral Near:     Physical Exam Vitals and nursing note reviewed.  Constitutional:      General: He is not in acute distress.    Appearance: Normal appearance. He is well-developed. He is not toxic-appearing.  Cardiovascular:     Rate and Rhythm: Normal rate and regular rhythm.  Pulmonary:     Effort: Pulmonary effort is normal.     Breath sounds: Examination of the right-upper field reveals wheezing and rhonchi. Examination of the left-upper field reveals wheezing and rhonchi. Examination of the right-middle field reveals wheezing and rhonchi. Examination of the left-middle field reveals wheezing and rhonchi. Examination of the right-lower field reveals wheezing and rhonchi. Examination of the left-lower field reveals wheezing and rhonchi. Wheezing and rhonchi present.  Musculoskeletal:        General: Normal range of motion.  Skin:    General: Skin is warm and dry.  Neurological:     Mental Status: He is alert.  Psychiatric:        Mood and Affect: Mood normal.      UC Treatments / Results  Labs (all labs ordered are listed, but only abnormal results are displayed) Labs Reviewed - No data to display  EKG   Radiology DG Chest 2 View Result Date: 03/19/2024 CLINICAL DATA:  Shortness of breath and wheezing EXAM: CHEST - 2 VIEW  COMPARISON:  Radiograph 12/19/2022 FINDINGS: Stable cardiomediastinal silhouette. Chronic pericardial calcifications. Airspace opacities in the right lower lobe compatible with pneumonia. The left lung is clear. No pleural effusion or pneumothorax. IMPRESSION: Left lower lobe pneumonia. Follow-up in 3 months after treatment is recommended to ensure resolution. Electronically Signed   By: Rozell Cornet M.D.   On: 03/19/2024 19:15    Procedures Procedures (including critical care time)  Medications Ordered in UC Medications  triamcinolone  acetonide (KENALOG -40) injection 40 mg (40 mg Intramuscular Given 03/19/24 1927)    Initial Impression / Assessment and Plan / UC Course  I have reviewed the triage vital signs and the nursing notes.  Pertinent labs & imaging results that were available during my care of the patient were reviewed by me and considered in my medical decision making (see chart for details).     Chronic bronchitis with acute exacerbation and left lower lobe pneumonia X-ray revealed left lower lobe pneumonia.  Recommend follow-up in 3 months for recheck. Steroid injection given here in clinic.  Will prescribe prednisone  and Augmentin to treat the pneumonia and bronchitis. Recommend start Zyrtec in the day and continue the Singulair  at bedtime.  Also recommended to start Pepcid daily Follow-up with PCP for recheck.  Potentially may need referral to allergist Final Clinical Impressions(s) / UC Diagnoses   Final diagnoses:  Chronic bronchitis with acute exacerbation (HCC)  Pneumonia of left lower lobe due to infectious organism     Discharge Instructions      I will send your x results to your mychart tomorrow. Kenalog  injection given here and you can start the prednisone  tomorrow.  Start zyrtec in the am and continue the Singulair  at bedtime.  You can start Pepcid also during the day to see if this helps.  Recommend see ENT as planned and maybe an allergist.    ED  Prescriptions     Medication Sig Dispense Auth. Provider   predniSONE  (DELTASONE ) 20 MG tablet Take 2 tablets (40 mg total) by mouth daily with breakfast for 5 days. 10 tablet Tamecia Mcdougald A, FNP   promethazine -dextromethorphan (PROMETHAZINE -DM) 6.25-15 MG/5ML syrup Take 5 mLs by mouth 4 (four) times daily as needed for cough. 240 mL Kaelei Wheeler A, FNP   amoxicillin-clavulanate (AUGMENTIN) 875-125 MG tablet Take 1 tablet by mouth every 12 (twelve) hours. 14 tablet Landa Pine, FNP      PDMP not reviewed this encounter.   Landa Pine, FNP 03/20/24 [phone]

## 2024-03-27 DIAGNOSIS — I10 Essential (primary) hypertension: Secondary | ICD-10-CM | POA: Diagnosis not present

## 2024-03-27 DIAGNOSIS — G4733 Obstructive sleep apnea (adult) (pediatric): Secondary | ICD-10-CM | POA: Diagnosis not present

## 2024-03-31 ENCOUNTER — Other Ambulatory Visit: Payer: Self-pay | Admitting: Cardiology

## 2024-03-31 DIAGNOSIS — R601 Generalized edema: Secondary | ICD-10-CM

## 2024-04-04 ENCOUNTER — Other Ambulatory Visit: Payer: Self-pay | Admitting: Pulmonary Disease

## 2024-04-09 DIAGNOSIS — J45909 Unspecified asthma, uncomplicated: Secondary | ICD-10-CM | POA: Diagnosis not present

## 2024-04-09 DIAGNOSIS — R059 Cough, unspecified: Secondary | ICD-10-CM | POA: Diagnosis not present

## 2024-04-09 DIAGNOSIS — H6121 Impacted cerumen, right ear: Secondary | ICD-10-CM | POA: Diagnosis not present

## 2024-04-09 DIAGNOSIS — G4733 Obstructive sleep apnea (adult) (pediatric): Secondary | ICD-10-CM | POA: Diagnosis not present

## 2024-04-30 ENCOUNTER — Encounter: Payer: Self-pay | Admitting: Gastroenterology

## 2024-05-03 ENCOUNTER — Other Ambulatory Visit: Payer: Self-pay | Admitting: Cardiology

## 2024-05-06 ENCOUNTER — Other Ambulatory Visit: Payer: Self-pay | Admitting: Cardiology

## 2024-05-11 DIAGNOSIS — J189 Pneumonia, unspecified organism: Secondary | ICD-10-CM | POA: Diagnosis not present

## 2024-05-11 DIAGNOSIS — J42 Unspecified chronic bronchitis: Secondary | ICD-10-CM | POA: Diagnosis not present

## 2024-05-11 DIAGNOSIS — J209 Acute bronchitis, unspecified: Secondary | ICD-10-CM | POA: Diagnosis not present

## 2024-05-11 DIAGNOSIS — J45901 Unspecified asthma with (acute) exacerbation: Secondary | ICD-10-CM | POA: Diagnosis not present

## 2024-05-12 ENCOUNTER — Encounter (INDEPENDENT_AMBULATORY_CARE_PROVIDER_SITE_OTHER): Payer: BC Managed Care – PPO | Admitting: Ophthalmology

## 2024-05-12 DIAGNOSIS — Z794 Long term (current) use of insulin: Secondary | ICD-10-CM | POA: Diagnosis not present

## 2024-05-12 DIAGNOSIS — E103593 Type 1 diabetes mellitus with proliferative diabetic retinopathy without macular edema, bilateral: Secondary | ICD-10-CM

## 2024-05-12 DIAGNOSIS — H35372 Puckering of macula, left eye: Secondary | ICD-10-CM

## 2024-05-12 DIAGNOSIS — H35033 Hypertensive retinopathy, bilateral: Secondary | ICD-10-CM | POA: Diagnosis not present

## 2024-05-12 DIAGNOSIS — I10 Essential (primary) hypertension: Secondary | ICD-10-CM | POA: Diagnosis not present

## 2024-05-12 DIAGNOSIS — H43812 Vitreous degeneration, left eye: Secondary | ICD-10-CM

## 2024-05-13 ENCOUNTER — Ambulatory Visit: Admitting: Adult Health

## 2024-05-13 ENCOUNTER — Encounter: Payer: Self-pay | Admitting: Adult Health

## 2024-05-13 ENCOUNTER — Telehealth: Payer: Self-pay | Admitting: Adult Health

## 2024-05-13 VITALS — BP 100/70 | HR 91 | Temp 98.0°F | Ht 71.0 in | Wt 246.8 lb

## 2024-05-13 DIAGNOSIS — R053 Chronic cough: Secondary | ICD-10-CM

## 2024-05-13 DIAGNOSIS — J4551 Severe persistent asthma with (acute) exacerbation: Secondary | ICD-10-CM | POA: Diagnosis not present

## 2024-05-13 DIAGNOSIS — G4733 Obstructive sleep apnea (adult) (pediatric): Secondary | ICD-10-CM | POA: Diagnosis not present

## 2024-05-13 DIAGNOSIS — E109 Type 1 diabetes mellitus without complications: Secondary | ICD-10-CM | POA: Diagnosis not present

## 2024-05-13 MED ORDER — PREDNISONE 10 MG PO TABS
ORAL_TABLET | ORAL | 0 refills | Status: DC
Start: 2024-05-13 — End: 2024-07-14

## 2024-05-13 MED ORDER — ALBUTEROL SULFATE (2.5 MG/3ML) 0.083% IN NEBU: 2.5000 mg | INHALATION_SOLUTION | Freq: Four times a day (QID) | RESPIRATORY_TRACT | 5 refills | Status: AC | PRN

## 2024-05-13 MED ORDER — BUDESONIDE 0.5 MG/2ML IN SUSP: 0.5000 mg | Freq: Two times a day (BID) | RESPIRATORY_TRACT | 3 refills | Status: DC

## 2024-05-13 NOTE — Progress Notes (Unsigned)
 @Patient  ID: [name], male    DOB: [birth year], 58 y.o.   MRN: [id number]  No chief complaint on file.   Referring provider: Magdaline Debby HERO, MD  HPI: 58 year old male never smoker followed for asthma with allergic phenotype and OSA Medical history significant for type 1 diabetes  TEST/EVENTS :  PFTs were normal in 2022 / FEV1 84% predicated, increased diffusion capacity. No BD response.  -CT chest 5/24 showed no acute process, mild airway thickening   Allergy  panel 2024 +tree, ragweed, grass (IgE 145) , Eos 630   05/13/2024 Acute OV : Asthma and Pneumonia    No Known Allergies  Immunization History  Administered Date(s) Administered   Influenza Inj Mdck Quad Pf 08/09/2015, 07/16/2019   Influenza Split 08/09/2015   Influenza, Seasonal, Injecte, Preservative Fre 09/05/2016, 09/24/2023   Influenza,inj,Quad PF,6-35 Mos 08/16/2017   Influenza,inj,quad, With Preservative 08/16/2017   Influenza-Unspecified 07/23/2013, 09/05/2016, 08/16/2017, 07/23/2021   Moderna Sars-Covid-2 Vaccination 09/06/2020, 05/27/2021   Pneumococcal Conjugate-13 11/19/2013   Pneumococcal Polysaccharide-23 06/30/2009   Tdap 05/21/2012    Past Medical History:  Diagnosis Date   Coronary artery disease    s/p NSTEMI after ETT-echo - [facility] 7/13:  LM < 10%, pLAD 90%, mLAD 95%, pCFX tandem 90%, mCFX 80-90%, pRCA 30%, mRCA 40-50%;  PCI: Xience DES x 2 to prox and mid LAD; Xience DES x 1 to prox and mid CFX   Family history of anesthesia complication    nausea    GERD (gastroesophageal reflux disease)    HLD (hyperlipidemia)    Hypertension    Lung nodule 2013   CT 7/13: 3 mm RML nodule - felt to be stable and benign/ follow up in 2014   Myocardial infarction [facility]) 2013   have 4 stents   S/P coronary artery stent placement    Type I diabetes mellitus (HCC)    insulin  dependent    Tobacco History: Social History   Tobacco Use  Smoking Status Never  Smokeless Tobacco Never   Counseling  given: Not Answered   Outpatient Medications Prior to Visit  Medication Sig Dispense Refill   ACCU-CHEK AVIVA PLUS test strip 1 each by Other route as needed.      albuterol  (PROVENTIL  HFA;VENTOLIN  HFA) 108 (90 Base) MCG/ACT inhaler Inhale 1 puff into the lungs every 6 (six) hours as needed for wheezing or shortness of breath.     amoxicillin -clavulanate (AUGMENTIN ) 875-125 MG tablet Take 1 tablet by mouth every 12 (twelve) hours. 14 tablet 0   aspirin  81 MG tablet Take 81 mg by mouth daily.     azelastine (ASTELIN) 0.1 % nasal spray Place 1 spray into the nose daily.     benzonatate  (TESSALON ) 100 MG capsule Take 1 capsule (100 mg total) by mouth 3 (three) times daily as needed for cough. 45 capsule 0   budesonide  (PULMICORT ) 0.5 MG/2ML nebulizer solution Take 2 mLs (0.5 mg total) by nebulization 2 (two) times daily as needed (wheezing). 60 mL 12   Coenzyme Q10 100 MG capsule Take by mouth.     Continuous Blood Gluc Sensor (DEXCOM G6 SENSOR) MISC      fluticasone  (FLONASE ) 50 MCG/ACT nasal spray SHAKE LIQUID AND USE 1 SPRAY IN EACH NOSTRIL DAILY 16 g 3   Fluticasone -Umeclidin-Vilant (TRELEGY ELLIPTA ) 200-62.5-25 MCG/ACT AEPB Inhale 1 puff into the lungs daily. 2 each 0   furosemide  (LASIX ) 20 MG tablet TAKE 1 TABLET(20 MG) BY MOUTH DAILY 90 tablet 2   insulin  aspart (NOVOLOG  FLEXPEN)  100 UNIT/ML FlexPen To be inject 3-4 times daily for meals or snacks at directed, up to 100 units daily.     ipratropium-albuterol  (DUONEB) 0.5-2.5 (3) MG/3ML SOLN Take 3 mLs by nebulization every 6 (six) hours as needed. 120 mL 1   isosorbide  mononitrate (IMDUR ) 60 MG 24 hr tablet TAKE 1 TABLET(60 MG) BY MOUTH DAILY 90 tablet 2   losartan  (COZAAR ) 25 MG tablet TAKE 1 TABLET(25 MG) BY MOUTH DAILY 90 tablet 1   montelukast  (SINGULAIR ) 10 MG tablet TAKE 1 TABLET(10 MG) BY MOUTH AT BEDTIME 90 tablet 3   Multiple Vitamins-Minerals (AIRBORNE GUMMIES) CHEW Chew 3 capsules by mouth daily.     nitroGLYCERIN  (NITROSTAT )  0.4 MG SL tablet DISSOLVE 1 TABLET UNDER TONGUE EVERY 5 MINUTES AS NEEDED FOR CHEST PAIN AS DIRECTED 25 tablet 8   promethazine -dextromethorphan (PROMETHAZINE -DM) 6.25-15 MG/5ML syrup Take 5 mLs by mouth 4 (four) times daily as needed for cough. 240 mL 0   REPATHA  140 MG/ML SOSY INJECT 140 MG EVERY 14 DAYS 6 mL 3   rosuvastatin  (CRESTOR ) 5 MG tablet TAKE 1 TABLET(5 MG) BY MOUTH DAILY AT 6 PM 90 tablet 0   TRELEGY ELLIPTA  200-62.5-25 MCG/ACT AEPB INHALE 1 PUFF INTO THE LUNGS DAILY 60 each 5   No facility-administered medications prior to visit.     Review of Systems:   Constitutional:   No  weight loss, night sweats,  Fevers, chills, fatigue, or  lassitude.  HEENT:   No headaches,  Difficulty swallowing,  Tooth/dental problems, or  Sore throat,                No sneezing, itching, ear ache, nasal congestion, post nasal drip,   CV:  No chest pain,  Orthopnea, PND, swelling in lower extremities, anasarca, dizziness, palpitations, syncope.   GI  No heartburn, indigestion, abdominal pain, nausea, vomiting, diarrhea, change in bowel habits, loss of appetite, bloody stools.   Resp: No shortness of breath with exertion or at rest.  No excess mucus, no productive cough,  No non-productive cough,  No coughing up of blood.  No change in color of mucus.  No wheezing.  No chest wall deformity  Skin: no rash or lesions.  GU: no dysuria, change in color of urine, no urgency or frequency.  No flank pain, no hematuria   MS:  No joint pain or swelling.  No decreased range of motion.  No back pain.    Physical Exam  There were no vitals taken for this visit.  GEN: A/Ox3; pleasant , NAD, well nourished    HEENT:  [place]/AT,  EACs-clear, TMs-wnl, NOSE-clear, THROAT-clear, no lesions, no postnasal drip or exudate noted.   NECK:  Supple w/ fair ROM; no JVD; normal carotid impulses w/o bruits; no thyromegaly or nodules palpated; no lymphadenopathy.    RESP  Clear  P & A; w/o, wheezes/ rales/ or rhonchi.  no accessory muscle use, no dullness to percussion  CARD:  RRR, no m/r/g, no peripheral edema, pulses intact, no cyanosis or clubbing.  GI:   Soft & nt; nml bowel sounds; no organomegaly or masses detected.   Musco: Warm bil, no deformities or joint swelling noted.   Neuro: alert, no focal deficits noted.    Skin: Warm, no lesions or rashes    Lab Results:  CBC    Component Value Date/Time   WBC 8.4 03/09/2023 1613   RBC 4.84 03/09/2023 1613   HGB 14.9 03/09/2023 1613   HGB 14.8 06/24/2021 1352  HCT 44.5 03/09/2023 1613   HCT 42.9 06/24/2021 1352   PLT 339 03/09/2023 1613   PLT 299 06/24/2021 1352   MCV 91.9 03/09/2023 1613   MCV 93 06/24/2021 1352   MCH 30.8 03/09/2023 1613   MCHC 33.5 03/09/2023 1613   RDW 13.0 03/09/2023 1613   RDW 12.2 06/24/2021 1352   LYMPHSABS 2,150 03/09/2023 1613   MONOABS 0.8 11/13/2022 1024   EOSABS 630 (H) 03/09/2023 1613   BASOSABS 76 03/09/2023 1613    BMET    Component Value Date/Time   NA 137 10/03/2023 0934   K 4.4 10/03/2023 0934   CL 99 10/03/2023 0934   CO2 23 10/03/2023 0934   GLUCOSE 177 (H) 10/03/2023 0934   GLUCOSE 170 (H) 11/13/2022 1024   BUN 19 10/03/2023 0934   CREATININE 0.85 10/03/2023 0934   CALCIUM  9.8 10/03/2023 0934   GFRNONAA 102 10/13/2019 1017   GFRAA 118 10/13/2019 1017    BNP No results found for: BNP  ProBNP    Component Value Date/Time   PROBNP 30 05/18/2021 1701    Imaging: No results found.  Administration History     None          Latest Ref Rng & Units 10/06/2021   10:48 AM  PFT Results  FVC-Pre L 3.89   FVC-Predicted Pre % 76   FVC-Post L 4.08   FVC-Predicted Post % 80   Pre FEV1/FVC % % 82   Post FEV1/FCV % % 80   FEV1-Pre L 3.17   FEV1-Predicted Pre % 82   FEV1-Post L 3.25   DLCO uncorrected ml/min/mmHg 36.73   DLCO UNC% % 125   DLCO corrected ml/min/mmHg 36.73   DLCO COR %Predicted % 125   DLVA Predicted % 141   TLC L 7.16   TLC % Predicted % 99   RV %  Predicted % 142     No results found for: NITRICOXIDE      Assessment & Plan:   No problem-specific Assessment & Plan notes found for this encounter.     Madelin Stank, NP 05/13/2024

## 2024-05-13 NOTE — Telephone Encounter (Signed)
 Hi Devki, Saw this patient in clinic. Frequent asthma flares requiring insulin  (Type 1 DM) . Has elevated eos and Ige. Started Dupixent paperwork for him. Let me know if you need anything other information. Will finish up office note today.

## 2024-05-13 NOTE — Patient Instructions (Addendum)
 Continue on Trelegy 1 puff daily, rinse after use.  Albuterol  inahler As needed   Change Duoneb to Albuterol  neb As needed   Prednisone  taper over next week Finish Doxycycline  .  Change Zyrtec to Allegra daily  Continue on Pepcid Twice daily   Add Budesonide  neb Twice daily   Begin Dupixent injection every 2 weeks-start paperwork.  Robitussin DM 1 tsp 4hr As needed  for cough  Tessalon  Three times a day  as needed for cough  Sips of water  to soothe throat.  Monitor blood sugars closely, call Endocrinology if BS >250.  Continue on CPAP At bedtime .  Follow up with Dr. Neda in 4-6 weeks and As needed   Please contact office for sooner follow up if symptoms do not improve or worsen or seek emergency care

## 2024-05-14 ENCOUNTER — Telehealth: Payer: Self-pay

## 2024-05-14 NOTE — Telephone Encounter (Signed)
PA has been submitted, will await determination.

## 2024-05-14 NOTE — Telephone Encounter (Signed)
 Received New start paperwork for DUPIXENT. Will update as we work through the benefits process.  Initiated a Prior Authorization request to [employer] for DUPIXENT via CoverMyMeds, will submit once OV note has been completed by provider.   Key: [id number]

## 2024-05-14 NOTE — Telephone Encounter (Signed)
 Received DPX new start paperwork. [name] has submitted

## 2024-05-15 ENCOUNTER — Other Ambulatory Visit (HOSPITAL_COMMUNITY): Payer: Self-pay

## 2024-05-15 NOTE — Telephone Encounter (Signed)
 Thanks so much. Note is done.

## 2024-05-15 NOTE — Telephone Encounter (Signed)
 Dupixent copay card activated by phone: ID: [id number] BIN: [id number] PCN: LOYALTY Group: [id number]  Patient can be scheduled for Dupixent new start. Will use sample  ATC patient to schedule. Unable to reach. Left VM with my callback number  Sherry Pennant, PharmD, MPH, BCPS, CPP Clinical Pharmacist (Rheumatology and Pulmonology)

## 2024-05-15 NOTE — Telephone Encounter (Signed)
 Received notification from [facility] regarding a prior authorization for DUPIXENT. Authorization has been APPROVED from 04/14/2024 to 05/14/2025. Approval letter sent to scan center.  Pt is locked in to fill through a specific and undisclosed specialty pharmacy, however it is presumably [facility].  Authorization # [id number]  Will enroll pt into copay card program once I am back in the office and have access to the physical cards.

## 2024-05-19 NOTE — Telephone Encounter (Signed)
**Note De-identified  [place] Obfuscation** Please advise 

## 2024-05-19 NOTE — Progress Notes (Addendum)
 HPI Patient presents today to [facility] to see pharmacy team for Dupixent  new start.  Past medical history includes Allergic asthma, OSA, T1DM, HTN, NSTEMI, CAD, and HLD.  Respiratory Medications Current regimen: Trelegy Ellipta  200-62.5-25 mcg/act. Inhale 1 puff into the lungs daily. Singulair  10mg  tablet. Take 1 tablet by mouth at bedtime. Tried in past: None Patient reports no known adherence challenges  OBJECTIVE No Known Allergies  Outpatient Encounter Medications as of 05/20/2024  Medication Sig   ACCU-CHEK AVIVA PLUS test strip 1 each by Other route as needed.    albuterol  (PROVENTIL  HFA;VENTOLIN  HFA) 108 (90 Base) MCG/ACT inhaler Inhale 1 puff into the lungs every 6 (six) hours as needed for wheezing or shortness of breath.   albuterol  (PROVENTIL ) (2.5 MG/3ML) 0.083% nebulizer solution Take 3 mLs (2.5 mg total) by nebulization every 6 (six) hours as needed for wheezing or shortness of breath.   amoxicillin -clavulanate (AUGMENTIN ) 875-125 MG tablet Take 1 tablet by mouth every 12 (twelve) hours.   aspirin  81 MG tablet Take 81 mg by mouth daily.   azelastine (ASTELIN) 0.1 % nasal spray Place 1 spray into the nose daily.   benzonatate  (TESSALON ) 100 MG capsule Take 1 capsule (100 mg total) by mouth 3 (three) times daily as needed for cough.   budesonide  (PULMICORT ) 0.5 MG/2ML nebulizer solution Take 2 mLs (0.5 mg total) by nebulization 2 (two) times daily.   Coenzyme Q10 100 MG capsule Take by mouth.   Continuous Blood Gluc Sensor (DEXCOM G6 SENSOR) MISC    fluticasone  (FLONASE ) 50 MCG/ACT nasal spray SHAKE LIQUID AND USE 1 SPRAY IN EACH NOSTRIL DAILY (Patient not taking: Reported on 05/13/2024)   Fluticasone -Umeclidin-Vilant (TRELEGY ELLIPTA ) 200-62.5-25 MCG/ACT AEPB Inhale 1 puff into the lungs daily.   furosemide  (LASIX ) 20 MG tablet TAKE 1 TABLET(20 MG) BY MOUTH DAILY   insulin  aspart (NOVOLOG  FLEXPEN) 100 UNIT/ML FlexPen To be inject 3-4 times daily for meals or snacks  at directed, up to 100 units daily.   isosorbide  mononitrate (IMDUR ) 60 MG 24 hr tablet TAKE 1 TABLET(60 MG) BY MOUTH DAILY   losartan  (COZAAR ) 25 MG tablet TAKE 1 TABLET(25 MG) BY MOUTH DAILY   montelukast  (SINGULAIR ) 10 MG tablet TAKE 1 TABLET(10 MG) BY MOUTH AT BEDTIME   Multiple Vitamins-Minerals (AIRBORNE GUMMIES) CHEW Chew 3 capsules by mouth daily. (Patient not taking: Reported on 05/13/2024)   nitroGLYCERIN  (NITROSTAT ) 0.4 MG SL tablet DISSOLVE 1 TABLET UNDER TONGUE EVERY 5 MINUTES AS NEEDED FOR CHEST PAIN AS DIRECTED   predniSONE  (DELTASONE ) 10 MG tablet 4 tabs for 2 days, then 3 tabs for 2 days, 2 tabs for 2 days, then 1 tab for 2 days, then stop   promethazine -dextromethorphan (PROMETHAZINE -DM) 6.25-15 MG/5ML syrup Take 5 mLs by mouth 4 (four) times daily as needed for cough.   REPATHA  140 MG/ML SOSY INJECT 140 MG EVERY 14 DAYS   rosuvastatin  (CRESTOR ) 5 MG tablet TAKE 1 TABLET(5 MG) BY MOUTH DAILY AT 6 PM   TRELEGY ELLIPTA  200-62.5-25 MCG/ACT AEPB INHALE 1 PUFF INTO THE LUNGS DAILY (Patient not taking: Reported on 05/13/2024)   No facility-administered encounter medications on file as of 05/20/2024.     Immunization History  Administered Date(s) Administered   Influenza Inj Mdck Quad Pf 08/09/2015, 07/16/2019   Influenza Split 08/09/2015   Influenza, Seasonal, Injecte, Preservative Fre 09/05/2016, 09/24/2023   Influenza,inj,Quad PF,6-35 Mos 08/16/2017   Influenza,inj,quad, With Preservative 08/16/2017   Influenza-Unspecified 07/23/2013, 09/05/2016, 08/16/2017, 07/23/2021   Moderna Sars-Covid-2 Vaccination 09/06/2020, 05/27/2021  Pneumococcal Conjugate-13 11/19/2013   Pneumococcal Polysaccharide-23 06/30/2009   Tdap 05/21/2012     PFTs    Latest Ref Rng & Units 10/06/2021   10:48 AM  PFT Results  FVC-Pre L 3.89   FVC-Predicted Pre % 76   FVC-Post L 4.08   FVC-Predicted Post % 80   Pre FEV1/FVC % % 82   Post FEV1/FCV % % 80   FEV1-Pre L 3.17   FEV1-Predicted Pre % 82    FEV1-Post L 3.25   DLCO uncorrected ml/min/mmHg 36.73   DLCO UNC% % 125   DLCO corrected ml/min/mmHg 36.73   DLCO COR %Predicted % 125   DLVA Predicted % 141   TLC L 7.16   TLC % Predicted % 99   RV % Predicted % 142      Eosinophils Most recent blood eosinophil count was 600 taken on 03/20/2024.   IgE: 158 on 03/21/2023   Assessment   Biologics training for dupilumab  (Dupixent )  Goals of therapy: Mechanism: human monoclonal IgG4 antibody that inhibits interleukin-4 and interleukin-13 cytokine-induced responses, including release of proinflammatory cytokines, chemokines, and IgE Reviewed that Dupixent  is add-on medication and patient must continue maintenance inhaler regimen. Response to therapy: may take 4 months to determine efficacy. Discussed that patients generally feel improvement sooner than 4 months.  Side effects: injection site reaction (6-18%), antibody development (5-16%), ophthalmic conjunctivitis (2-16%), transient blood eosinophilia (1-2%)  Dose: 600mg  at Week 0 (administered today in clinic) followed by 300mg  every 14 days thereafter  Administration/Storage:  Reviewed administration sites of thigh or abdomen (at least 2-3 inches away from abdomen). Reviewed the upper arm is only appropriate if caregiver is administering injection  Do not shake pen/syringe as this could lead to product foaming or precipitation. Do not use if solution is discolored or contains particulate matter or if window on prefilled pen is yellow (indicates pen has been used).  Reviewed storage of medication in refrigerator. Reviewed that Dupixent  can be stored at room temperature in unopened carton for up to 14 days.  Access: Approval of Dupixent  through: insurance Patient enrolled into copay card program to help with copay assistance.  Patient self-administered Dupixent  300mg /1ml x 2 (total dose 600mg ) in left lower abdomen and administered by wife in the left upper back arm using sample  medication Dupixent  300mg /48mL autoinjector pen NDC: [phone] Lot: [id number] Expiration: 05/22/2026  Patient monitored for 30 minutes for adverse reaction.  Patient tolerated injections well noting a little pain on injection. Stated he could feel it going in. Injection site noted. Patient denies itchiness and irritation at injection., No swelling or redness noted., and Reviewed injection site reaction management with patient verbally and printed information for review in AVS  Medication Reconciliation  A drug regimen assessment was performed, including review of allergies, interactions, disease-state management, dosing and immunization history. Medications were reviewed with the patient, including name, instructions, indication, goals of therapy, potential side effects, importance of adherence, and safe use.  Drug interaction(s): none   PLAN Continue Dupixent  300 mg every 14 days.  Next dose is due 06/03/2024 and every 14 days thereafter. Rx sent to: [facility]: [phone].  Patient provided with pharmacy phone number and advised to call later this week to schedule shipment to home. Patient provided with copay card information to provide to pharmacy if quoted copay exceeds $5 per month. Continue maintenance inhaler regimen of: Trelegy Ellipta  200-62.5-25 mcg/act. Inhale 1 puff into the lungs daily. Singulair  10mg  tablet. Take 1 tablet by mouth at bedtime.  All questions encouraged and answered.  Instructed patient to reach out with any further questions or concerns.  Thank you for allowing pharmacy to participate in this patient's care.  This appointment required 45 minutes of patient care (this includes precharting, chart review, review of results, face-to-face care, etc.).

## 2024-05-19 NOTE — Progress Notes (Signed)
 FYI--pt scheduled for Dupixent  new start visit on 05/20/24

## 2024-05-19 NOTE — Telephone Encounter (Signed)
 Dupixent  new start scheduled for 05/20/2024

## 2024-05-20 ENCOUNTER — Ambulatory Visit: Admitting: Pharmacist

## 2024-05-20 ENCOUNTER — Telehealth: Payer: Self-pay | Admitting: *Deleted

## 2024-05-20 ENCOUNTER — Encounter: Payer: Self-pay | Admitting: Cardiology

## 2024-05-20 ENCOUNTER — Telehealth: Payer: Self-pay

## 2024-05-20 DIAGNOSIS — J4550 Severe persistent asthma, uncomplicated: Secondary | ICD-10-CM | POA: Diagnosis not present

## 2024-05-20 DIAGNOSIS — Z7189 Other specified counseling: Secondary | ICD-10-CM

## 2024-05-20 MED ORDER — DUPIXENT 300 MG/2ML ~~LOC~~ SOAJ: 300.0000 mg | SUBCUTANEOUS | 1 refills | Status: DC

## 2024-05-20 NOTE — Telephone Encounter (Signed)
 Request for orders for insulin  pump printed and faxed to Delon Nigh, NP

## 2024-05-20 NOTE — Telephone Encounter (Signed)
 Confirmation fax received.

## 2024-05-20 NOTE — Telephone Encounter (Signed)
 Called patient to verify who maintains his insulin  pump. Request for orders for patient's insulin  pump faxed to Delon Nigh, NP.

## 2024-05-20 NOTE — Patient Instructions (Signed)
 Your next Dupixent  dose is due on 06/03/2024, 06/17/2024, and every 14 days thereafter  CONTINUE Trelegy Ellipta  200-62.5-25 mcg/act. Inhale 1 puff into the lungs daily. Singulair  10mg  tablet. Take 1 tablet by mouth at bedtime.  Your prescription will be shipped from [facility]. Their phone number is [phone]. Please call to schedule shipment and confirm address. They will mail your medication to your home.  Your copay should be affordable. If you call the pharmacy and it is not affordable, please double-check that they are billing through your copay card as secondary coverage. That copay card information is: ID: [id number] BIN: [id number] PCN: LOYALTY Group Number: [id number]  You will need to be seen by your provider in 3 to 4 months to assess how Dupixent  is working for you. Please ensure you have a follow-up appointment scheduled in 3 months. Call our clinic if you need to make this appointment.  Stay up to date on all routine vaccines: influenza, pneumonia, COVID19, Shingles  How to manage an injection site reaction: Remember the 5 C's: COUNTER - leave on the counter at least 30 minutes but up to overnight to bring medication to room temperature. This may help prevent stinging COLD - place something cold (like an ice gel pack or cold water  bottle) on the injection site just before cleansing with alcohol. This may help reduce pain CLARITIN - use Claritin (generic name is loratadine) for the first two weeks of treatment or the day of, the day before, and the day after injecting. This will help to minimize injection site reactions CORTISONE CREAM - apply if injection site is irritated and itching CALL ME - if injection site reaction is bigger than the size of your fist, looks infected, blisters, or if you develop hives

## 2024-05-21 NOTE — Telephone Encounter (Signed)
 Needs office visit, as not improving and condition worsening. Can DB my schcedule if no opening or can go to ER/Urgent care

## 2024-05-23 NOTE — Telephone Encounter (Signed)
 Letter for insulin  pump resent with different fax number for response.

## 2024-05-28 NOTE — Progress Notes (Unsigned)
 Cardiology Office Note:   Date:  05/29/2024  ID:  [name], DOB Aug 25, 1966, MRN [id number] PCP: Magdaline Debby HERO, MD  [facility] Providers Cardiologist:  Lynwood Schilling, MD {  History of Present Illness:   [name] is a 58 y.o. male who presents for followup of his coronary disease. He is status post multivessel PCI.  He did have a stress test in 2017. He had an equivocal stress test had a cath with proximal mid LAD stents with mild in-stent restenosis.  He had known occlusion of a previously stented obtuse marginal.  The RCA was a dominant vessel with mild stenosis.  He was managed medically.   Since he was last seen he has had significant problem with his asthma.  He is been on steroids.  His diabetes has been out of control because of it.  He is at weight gain and some increased swelling.  He was just put on Dupixent  and thinks that this helped in the last couple of days.  Prior to this he was having a lot of coughing and almost syncope with this.  He is finally in the last few days breathing better.  He actually had on his exercise bike.  He has been able to do that in the last day.  Prior to his asthma acting up he said he was doing relatively well.  He was not having any chest discomfort, neck or arm discomfort.  He was having no new shortness of breath.  He was not having any new PND or orthopnea.  He is not having any new palpitations, presyncope or syncope.  ROS: As stated in the HPI and negative for all other systems.  Studies Reviewed:    EKG:   EKG Interpretation Date/Time:  Thursday May 29 2024 11:10:32 EDT Ventricular Rate:  98 PR Interval:  152 QRS Duration:  88 QT Interval:  356 QTC Calculation: 454 R Axis:   79  Text Interpretation: Normal sinus rhythm ST & T wave abnormality, consider inferior ischemia When compared with ECG of 03-Oct-2023 09:00, No significant change was found Confirmed by Schilling Lynwood ([id number]) on 05/29/2024 11:46:05 AM     Risk  Assessment/Calculations:      Physical Exam:   VS:  BP (!) 140/80 (BP Location: Right Arm, Patient Position: Sitting, Cuff Size: Normal)   Pulse 98   Ht 5' 11 (1.803 m)   Wt 254 lb 6.4 oz (115.4 kg)   SpO2 95%   BMI 35.48 kg/m    Wt Readings from Last 3 Encounters:  05/29/24 254 lb 6.4 oz (115.4 kg)  05/13/24 246 lb 12.8 oz (111.9 kg)  11/27/23 244 lb 3.2 oz (110.8 kg)     GEN: Well nourished, well developed in no acute distress NECK: No JVD; No carotid bruits CARDIAC: RRR, no murmurs, rubs, gallops RESPIRATORY:  Clear to auscultation without rales, wheezing or rhonchi  ABDOMEN: Soft, non-tender, non-distended EXTREMITIES:  Moderate leg edema; No deformity   ASSESSMENT AND PLAN:   CAD :  The patient has no new sypmtoms.  No further cardiovascular testing is indicated.  We will continue with aggressive risk reduction and meds as listed.  Hypertension : Blood pressure is upper limits of normal but this is unusual and he will keep a look at this on the blood pressure diary.  No change in therapy.   Hyperlipidemia : LDL was 39 with an HDL of 45.  No change in therapy.    DM : A1c  is up to 7.9.  He is having this followed by his primary.    Edema: His lower extremity swelling is increased.  I am going to have him take his Lasix  40 mg daily for the next 3 days.  If he is not responding to this he is gena let me know and I might change his diuretic further.     Follow up with me in four months.   Signed, Lynwood Schilling, MD

## 2024-05-29 ENCOUNTER — Encounter: Payer: Self-pay | Admitting: Cardiology

## 2024-05-29 ENCOUNTER — Ambulatory Visit: Attending: Cardiology | Admitting: Cardiology

## 2024-05-29 VITALS — BP 140/80 | HR 98 | Ht 71.0 in | Wt 254.4 lb

## 2024-05-29 DIAGNOSIS — E118 Type 2 diabetes mellitus with unspecified complications: Secondary | ICD-10-CM

## 2024-05-29 DIAGNOSIS — M7989 Other specified soft tissue disorders: Secondary | ICD-10-CM

## 2024-05-29 DIAGNOSIS — I2510 Atherosclerotic heart disease of native coronary artery without angina pectoris: Secondary | ICD-10-CM

## 2024-05-29 DIAGNOSIS — I10 Essential (primary) hypertension: Secondary | ICD-10-CM | POA: Diagnosis not present

## 2024-05-29 DIAGNOSIS — G4730 Sleep apnea, unspecified: Secondary | ICD-10-CM

## 2024-05-29 NOTE — Patient Instructions (Signed)
 Medication Instructions:  Take an extra 20 mg of Lasix  for 4 days *If you need a refill on your cardiac medications before your next appointment, please call your pharmacy*  Lab Work: NONE If you have labs (blood work) drawn today and your tests are completely normal, you will receive your results only by: MyChart Message (if you have MyChart) OR A paper copy in the mail If you have any lab test that is abnormal or we need to change your treatment, we will call you to review the results.  Testing/Procedures: NONE  Follow-Up: At [facility], you and your health needs are our priority.  As part of our continuing mission to provide you with exceptional heart care, our providers are all part of one team.  This team includes your primary Cardiologist (physician) and Advanced Practice Providers or APPs (Physician Assistants and Nurse Practitioners) who all work together to provide you with the care you need, when you need it.  Your next appointment:   4 month(s)  Provider:   Lynwood Schilling, MD    We recommend signing up for the patient portal called MyChart.  Sign up information is provided on this After Visit Summary.  MyChart is used to connect with patients for Virtual Visits (Telemedicine).  Patients are able to view lab/test results, encounter notes, upcoming appointments, etc.  Non-urgent messages can be sent to your provider as well.   To learn more about what you can do with MyChart, go to [email or web address].

## 2024-06-05 ENCOUNTER — Other Ambulatory Visit: Payer: Self-pay | Admitting: Primary Care

## 2024-06-05 ENCOUNTER — Telehealth: Payer: Self-pay | Admitting: Gastroenterology

## 2024-06-05 NOTE — Telephone Encounter (Signed)
 Patient returning a call from nurse  Requesting a call back   Please advise  Thank you

## 2024-06-05 NOTE — Telephone Encounter (Signed)
 Received orders from [facility] office regarding patient's insulin  pump. Per instructions patient should decrease percentage to 50% for 48 hours prior to procedure. Once he is able to eat normally, patient should cancel the 50% input. Attempted to reach patient to discuss. No answer, left vm for patient to return call.

## 2024-06-05 NOTE — Telephone Encounter (Signed)
 Called [name]'s office to try to obtain orders patient's insulin  pump. Spoke with front desk, they will send a message to try and fax instructions faxed over.

## 2024-06-05 NOTE — Telephone Encounter (Signed)
 Charted in a separate encounter. Closing this encounter.

## 2024-06-05 NOTE — Telephone Encounter (Signed)
 Spoke with patient. Discussed instructions for insulin  pump. Read instructions to patient. He verbalized understanding. Patient also has appointment with endocrinology next week so he will f/u if he has any questions. Fax placed in folder to be scanned.

## 2024-06-06 ENCOUNTER — Ambulatory Visit (AMBULATORY_SURGERY_CENTER): Admitting: *Deleted

## 2024-06-06 ENCOUNTER — Encounter: Payer: Self-pay | Admitting: Gastroenterology

## 2024-06-06 VITALS — Ht 71.0 in | Wt 248.0 lb

## 2024-06-06 DIAGNOSIS — Z860100 Personal history of colon polyps, unspecified: Secondary | ICD-10-CM

## 2024-06-06 MED ORDER — SUTAB 1479-225-188 MG PO TABS: 24.0000 | ORAL_TABLET | Freq: Once | ORAL | 0 refills | Status: AC

## 2024-06-06 NOTE — Progress Notes (Signed)
 Pre visit completed over telephone. Instructions sent through [place] and secure email.    No egg or soy allergy  known to patient  No issues known to pt with past sedation with any surgeries or procedures Patient denies ever being told they had issues or difficulty with intubation  No FH of Malignant Hyperthermia Pt is not on diet pills Pt is not on  home 02  Pt is not on blood thinners  Pt denies issues with constipation  No A fib or A flutter Have any cardiac testing pending-- NO- patient had cardiologist appt last week and got a good report  Patient will call if S[employer]  is cost prohibitive.  No coupon available for [employer].   Chart reviewed by DOROTHA Schillings, CRNA

## 2024-06-09 DIAGNOSIS — E103593 Type 1 diabetes mellitus with proliferative diabetic retinopathy without macular edema, bilateral: Secondary | ICD-10-CM | POA: Diagnosis not present

## 2024-06-09 DIAGNOSIS — Z9641 Presence of insulin pump (external) (internal): Secondary | ICD-10-CM | POA: Diagnosis not present

## 2024-06-09 DIAGNOSIS — E8881 Metabolic syndrome: Secondary | ICD-10-CM | POA: Diagnosis not present

## 2024-06-09 DIAGNOSIS — Z978 Presence of other specified devices: Secondary | ICD-10-CM | POA: Diagnosis not present

## 2024-06-16 ENCOUNTER — Other Ambulatory Visit: Payer: Self-pay | Admitting: Pulmonary Disease

## 2024-06-20 ENCOUNTER — Encounter: Payer: Self-pay | Admitting: Gastroenterology

## 2024-06-20 ENCOUNTER — Ambulatory Visit: Admitting: Gastroenterology

## 2024-06-20 VITALS — BP 125/73 | HR 82 | Temp 97.7°F | Resp 18 | Ht 71.0 in | Wt 248.0 lb

## 2024-06-20 DIAGNOSIS — Z1211 Encounter for screening for malignant neoplasm of colon: Secondary | ICD-10-CM

## 2024-06-20 DIAGNOSIS — D123 Benign neoplasm of transverse colon: Secondary | ICD-10-CM

## 2024-06-20 DIAGNOSIS — D122 Benign neoplasm of ascending colon: Secondary | ICD-10-CM | POA: Diagnosis not present

## 2024-06-20 DIAGNOSIS — K648 Other hemorrhoids: Secondary | ICD-10-CM

## 2024-06-20 DIAGNOSIS — K644 Residual hemorrhoidal skin tags: Secondary | ICD-10-CM | POA: Diagnosis not present

## 2024-06-20 DIAGNOSIS — Z860101 Personal history of adenomatous and serrated colon polyps: Secondary | ICD-10-CM | POA: Diagnosis not present

## 2024-06-20 DIAGNOSIS — Z860100 Personal history of colon polyps, unspecified: Secondary | ICD-10-CM

## 2024-06-20 MED ORDER — SODIUM CHLORIDE 0.9 % IV SOLN: 500.0000 mL | Freq: Once | INTRAVENOUS | Status: DC

## 2024-06-20 NOTE — Progress Notes (Signed)
 [facility] History and Physical   Primary Care Physician:  Magdaline Debby HERO, MD   Reason for Procedure:  History of adenomatous colon polyps  Plan:    Surveillance colonoscopy with possible interventions as needed     HPI: [name] is a very pleasant 58 y.o. male here for surveillance colonoscopy. Denies any nausea, vomiting, abdominal pain, melena or bright red blood per rectum  The risks and benefits as well as alternatives of endoscopic procedure(s) have been discussed and reviewed. All questions answered. The patient agrees to proceed.    Past Medical History:  Diagnosis Date   Asthma    Coronary artery disease    s/p NSTEMI after ETT-echo - LHC 7/13:  LM < 10%, pLAD 90%, mLAD 95%, pCFX tandem 90%, mCFX 80-90%, pRCA 30%, mRCA 40-50%;  PCI: Xience DES x 2 to prox and mid LAD; Xience DES x 1 to prox and mid CFX   Family history of anesthesia complication    nausea    GERD (gastroesophageal reflux disease)    HLD (hyperlipidemia)    Hypertension    Lung nodule 2013   CT 7/13: 3 mm RML nodule - felt to be stable and benign/ follow up in 2014   Myocardial infarction [facility]) 2013   have 4 stents   S/P coronary artery stent placement    Sleep apnea    Type I diabetes mellitus (HCC)    insulin  dependent    Past Surgical History:  Procedure Laterality Date   25 GAUGE PARS PLANA VITRECTOMY WITH 20 GAUGE MVR PORT FOR MACULAR HOLE Right 08/01/2016   25 GAUGE PARS PLANA VITRECTOMY WITH 20 GAUGE MVR PORT FOR MACULAR HOLE, MEMBRANE PEEL AND SERUM PATCH WITH ENDOLASER AND C3F8    25 GAUGE PARS PLANA VITRECTOMY WITH 20 GAUGE MVR PORT FOR MACULAR HOLE Right 08/01/2016   Procedure: 25 GAUGE PARS PLANA VITRECTOMY WITH 20 GAUGE MVR PORT FOR MACULAR HOLE, MEMBRANE PEEL AND SERUM PATCH WITH ENDOLASER AND C3F8;  Surgeon: Norleen JONETTA Ku, MD;  Location: [facility] OR;  Service: Ophthalmology;  Laterality: Right;   APPENDECTOMY  1980's   CATARACT EXTRACTION  07/2017   right eye    CHOLECYSTECTOMY  ~ 2006   COLONOSCOPY     CORONARY ANGIOPLASTY WITH STENT PLACEMENT  04/2013   LAD   GAS/FLUID EXCHANGE Right 09/16/2013   Procedure: AIR/FLUID EXCHANGE;  Surgeon: Norleen JONETTA Ku, MD;  Location: [facility] OR;  Service: Ophthalmology;  Laterality: Right;   GAS/FLUID EXCHANGE Right 08/01/2016   Procedure: GAS/FLUID EXCHANGE;  Surgeon: Norleen JONETTA Ku, MD;  Location: [facility] OR;  Service: Ophthalmology;  Laterality: Right;   KNEE ARTHROSCOPY Right ~ 1984   LEFT HEART CATH AND CORONARY ANGIOGRAPHY N/A 10/14/2019   Procedure: LEFT HEART CATH AND CORONARY ANGIOGRAPHY;  Surgeon: Swaziland, Peter M, MD;  Location: [facility] INVASIVE CV LAB;  Service: Cardiovascular;  Laterality: N/A;   LEFT HEART CATH AND CORONARY ANGIOGRAPHY N/A 07/01/2021   Procedure: LEFT HEART CATH AND CORONARY ANGIOGRAPHY;  Surgeon: Verlin Lonni JONETTA, MD;  Location: [facility] INVASIVE CV LAB;  Service: Cardiovascular;  Laterality: N/A;   LEFT HEART CATHETERIZATION WITH CORONARY ANGIOGRAM N/A 05/03/2012   Procedure: LEFT HEART CATHETERIZATION WITH CORONARY ANGIOGRAM;  Surgeon: Peter M Swaziland, MD;  Location: [facility] CATH LAB;  Service: Cardiovascular;  Laterality: N/A;   MEMBRANE PEEL Right 09/16/2013   Procedure: MEMBRANE PEEL;  Surgeon: Norleen JONETTA Ku, MD;  Location: [facility] OR;  Service: Ophthalmology;  Laterality: Right;   PARS PLANA VITRECTOMY  Right 09/16/2013   w/membrane peel/notes 11/25/20145   PARS PLANA VITRECTOMY Right 09/16/2013   Procedure: PARS PLANA VITRECTOMY WITH 25 GAUGE;  Surgeon: Norleen JONETTA Ku, MD;  Location: [facility] OR;  Service: Ophthalmology;  Laterality: Right;   PHOTOCOAGULATION WITH LASER Right 09/16/2013   Procedure: PHOTOCOAGULATION WITH LASER;  Surgeon: Norleen JONETTA Ku, MD;  Location: [facility] OR;  Service: Ophthalmology;  Laterality: Right;  ENDOLASER   POLYPECTOMY     SERUM PATCH Right 08/01/2016   Procedure: SERUM PATCH;  Surgeon: Norleen JONETTA Ku, MD;  Location: [facility] OR;  Service: Ophthalmology;  Laterality: Right;    TONSILLECTOMY AND ADENOIDECTOMY  1970's    Prior to Admission medications   Medication Sig Start Date End Date Taking? Authorizing Provider  ACCU-CHEK AVIVA PLUS test strip 1 each by Other route as needed.  05/07/12  Yes [provider]  albuterol  (PROVENTIL  HFA;VENTOLIN  HFA) 108 (90 Base) MCG/ACT inhaler Inhale 1 puff into the lungs every 6 (six) hours as needed for wheezing or shortness of breath.   Yes [provider]  albuterol  (PROVENTIL ) (2.5 MG/3ML) 0.083% nebulizer solution Take 3 mLs (2.5 mg total) by nebulization every 6 (six) hours as needed for wheezing or shortness of breath. 05/13/24 05/13/25 Yes Parrett, Tammy S, NP  aspirin  81 MG tablet Take 81 mg by mouth daily.   Yes [provider]  azelastine (ASTELIN) 0.1 % nasal spray Place 1 spray into the nose daily. 01/25/19  Yes [provider]  Continuous Blood Gluc Sensor (DEXCOM G6 SENSOR) MISC  07/20/19  Yes [provider]  Dupilumab  (DUPIXENT ) 300 MG/2ML SOAJ Inject 300 mg into the skin every 14 (fourteen) days. **loading dose received in clinic on 05/20/2024** 05/20/24  Yes Parrett, Tammy S, NP  fluticasone  (FLONASE ) 50 MCG/ACT nasal spray SHAKE LIQUID AND USE 1 SPRAY IN EACH NOSTRIL DAILY 12/03/23  Yes Hope Almarie ORN, NP  Fluticasone -Umeclidin-Vilant (TRELEGY ELLIPTA ) 200-62.5-25 MCG/ACT AEPB Inhale 1 puff into the lungs daily. 05/02/23  Yes Hope Almarie ORN, NP  furosemide  (LASIX ) 20 MG tablet TAKE 1 TABLET(20 MG) BY MOUTH DAILY 04/01/24  Yes Lavona Agent, MD  isosorbide  mononitrate (IMDUR ) 60 MG 24 hr tablet TAKE 1 TABLET(60 MG) BY MOUTH DAILY 11/07/23  Yes Hilty, Vinie BROCKS, MD  losartan  (COZAAR ) 25 MG tablet TAKE 1 TABLET(25 MG) BY MOUTH DAILY 10/02/23  Yes Lavona Agent, MD  montelukast  (SINGULAIR ) 10 MG tablet TAKE 1 TABLET(10 MG) BY MOUTH AT BEDTIME 06/05/24  Yes Hope Almarie ORN, NP  REPATHA  140 MG/ML SOSY INJECT 140 MG EVERY 14 DAYS 11/22/23  Yes Hilty, Vinie BROCKS, MD   rosuvastatin  (CRESTOR ) 5 MG tablet TAKE 1 TABLET(5 MG) BY MOUTH DAILY AT 6 PM 05/06/24  Yes Lavona Agent, MD  TRELEGY ELLIPTA  100-62.5-25 MCG/ACT AEPB INHALE 1 PUFF INTO THE LUNGS DAILY 06/17/24  Yes Hope Almarie ORN, NP  benzonatate  (TESSALON ) 100 MG capsule Take 1 capsule (100 mg total) by mouth 3 (three) times daily as needed for cough. 11/27/23   Hope Almarie ORN, NP  budesonide  (PULMICORT ) 0.5 MG/2ML nebulizer solution Take 2 mLs (0.5 mg total) by nebulization 2 (two) times daily. 05/13/24   Parrett, Madelin RAMAN, NP  Coenzyme Q10 100 MG capsule Take by mouth. Patient not taking: Reported on 05/29/2024    [provider]  insulin  aspart (NOVOLOG  FLEXPEN) 100 UNIT/ML FlexPen To be inject 3-4 times daily for meals or snacks at directed, up to 100 units daily. 06/30/21   [provider]  Multiple Vitamins-Minerals (AIRBORNE GUMMIES)  CHEW Chew 3 capsules by mouth daily.    [provider]  nitroGLYCERIN  (NITROSTAT ) 0.4 MG SL tablet DISSOLVE 1 TABLET UNDER TONGUE EVERY 5 MINUTES AS NEEDED FOR CHEST PAIN AS DIRECTED 02/14/24   Lavona Agent, MD  predniSONE  (DELTASONE ) 10 MG tablet 4 tabs for 2 days, then 3 tabs for 2 days, 2 tabs for 2 days, then 1 tab for 2 days, then stop Patient not taking: Reported on 05/29/2024 05/13/24   Parrett, Madelin RAMAN, NP  promethazine -dextromethorphan (PROMETHAZINE -DM) 6.25-15 MG/5ML syrup Take 5 mLs by mouth 4 (four) times daily as needed for cough. 03/19/24   Adah Wilbert LABOR, FNP    Current Outpatient Medications  Medication Sig Dispense Refill   ACCU-CHEK AVIVA PLUS test strip 1 each by Other route as needed.      albuterol  (PROVENTIL  HFA;VENTOLIN  HFA) 108 (90 Base) MCG/ACT inhaler Inhale 1 puff into the lungs every 6 (six) hours as needed for wheezing or shortness of breath.     albuterol  (PROVENTIL ) (2.5 MG/3ML) 0.083% nebulizer solution Take 3 mLs (2.5 mg total) by nebulization every 6 (six) hours as needed for wheezing or shortness of breath. 75 mL  5   aspirin  81 MG tablet Take 81 mg by mouth daily.     azelastine (ASTELIN) 0.1 % nasal spray Place 1 spray into the nose daily.     Continuous Blood Gluc Sensor (DEXCOM G6 SENSOR) MISC      Dupilumab  (DUPIXENT ) 300 MG/2ML SOAJ Inject 300 mg into the skin every 14 (fourteen) days. **loading dose received in clinic on 05/20/2024** 12 mL 1   fluticasone  (FLONASE ) 50 MCG/ACT nasal spray SHAKE LIQUID AND USE 1 SPRAY IN EACH NOSTRIL DAILY 16 g 3   Fluticasone -Umeclidin-Vilant (TRELEGY ELLIPTA ) 200-62.5-25 MCG/ACT AEPB Inhale 1 puff into the lungs daily. 2 each 0   furosemide  (LASIX ) 20 MG tablet TAKE 1 TABLET(20 MG) BY MOUTH DAILY 90 tablet 2   isosorbide  mononitrate (IMDUR ) 60 MG 24 hr tablet TAKE 1 TABLET(60 MG) BY MOUTH DAILY 90 tablet 2   losartan  (COZAAR ) 25 MG tablet TAKE 1 TABLET(25 MG) BY MOUTH DAILY 90 tablet 1   montelukast  (SINGULAIR ) 10 MG tablet TAKE 1 TABLET(10 MG) BY MOUTH AT BEDTIME 90 tablet 1   REPATHA  140 MG/ML SOSY INJECT 140 MG EVERY 14 DAYS 6 mL 3   rosuvastatin  (CRESTOR ) 5 MG tablet TAKE 1 TABLET(5 MG) BY MOUTH DAILY AT 6 PM 90 tablet 0   TRELEGY ELLIPTA  100-62.5-25 MCG/ACT AEPB INHALE 1 PUFF INTO THE LUNGS DAILY 60 each 3   benzonatate  (TESSALON ) 100 MG capsule Take 1 capsule (100 mg total) by mouth 3 (three) times daily as needed for cough. 45 capsule 0   budesonide  (PULMICORT ) 0.5 MG/2ML nebulizer solution Take 2 mLs (0.5 mg total) by nebulization 2 (two) times daily. 60 mL 3   Coenzyme Q10 100 MG capsule Take by mouth. (Patient not taking: Reported on 05/29/2024)     insulin  aspart (NOVOLOG  FLEXPEN) 100 UNIT/ML FlexPen To be inject 3-4 times daily for meals or snacks at directed, up to 100 units daily.     Multiple Vitamins-Minerals (AIRBORNE GUMMIES) CHEW Chew 3 capsules by mouth daily.     nitroGLYCERIN  (NITROSTAT ) 0.4 MG SL tablet DISSOLVE 1 TABLET UNDER TONGUE EVERY 5 MINUTES AS NEEDED FOR CHEST PAIN AS DIRECTED 25 tablet 8   predniSONE  (DELTASONE ) 10 MG tablet 4 tabs for  2 days, then 3 tabs for 2 days, 2 tabs for 2 days, then 1 tab for  2 days, then stop (Patient not taking: Reported on 05/29/2024) 20 tablet 0   promethazine -dextromethorphan (PROMETHAZINE -DM) 6.25-15 MG/5ML syrup Take 5 mLs by mouth 4 (four) times daily as needed for cough. 240 mL 0   Current Facility-Administered Medications  Medication Dose Route Frequency Provider Last Rate Last Admin   0.9 %  sodium chloride  infusion  500 mL Intravenous Once Zowie Lundahl V, MD        Allergies as of 06/20/2024   (No Known Allergies)    Family History  Problem Relation Age of Onset   Retinitis pigmentosa Father    Coronary artery disease Brother 61       Died   Heart attack Brother    Heart disease Brother    Hypertension Mother    Coronary artery disease Maternal Uncle 64       Died   Coronary artery disease Maternal Grandmother 85       Died   Aneurysm Maternal Grandfather 85       Abdominal   Aneurysm Daughter 16       Cerebral   Colon polyps Neg Hx    Colon cancer Neg Hx    Esophageal cancer Neg Hx    Stomach cancer Neg Hx    Rectal cancer Neg Hx     Social History   Socioeconomic History   Marital status: Married    Spouse name: Not on file   Number of children: 1   Years of education: Not on file   Highest education level: Not on file  Occupational History   Occupation: Furniture buisiness  Tobacco Use   Smoking status: Never   Smokeless tobacco: Never  Vaping Use   Vaping status: Never Used  Substance and Sexual Activity   Alcohol use: Yes    Comment: rare   Drug use: No   Sexual activity: Yes  Other Topics Concern   Not on file  Social History Narrative   Lives with wife and 3 step children.   Social Drivers of Corporate investment banker Strain: Low Risk  (06/08/2024)   Received from [facility]   Overall Financial Resource Strain (CARDIA)    How hard is it for you to pay for the very basics like food, housing, medical care, and heating?: Not hard at all   Food Insecurity: No Food Insecurity (06/08/2024)   Received from [facility]   Hunger Vital Sign    Within the past 12 months, you worried that your food would run out before you got the money to buy more.: Never true    Within the past 12 months, the food you bought just didn't last and you didn't have money to get more.: Never true  Transportation Needs: No Transportation Needs (06/08/2024)   Received from [facility] - Transportation    In the past 12 months, has lack of transportation kept you from medical appointments or from getting medications?: No    In the past 12 months, has lack of transportation kept you from meetings, work, or from getting things needed for daily living?: No  Physical Activity: Insufficiently Active (06/08/2024)   Received from [facility]   Exercise Vital Sign    On average, how many days per week do you engage in moderate to strenuous exercise (like a brisk walk)?: 3 days    On average, how many minutes do you engage in exercise at this level?: 30 min  Stress: No Stress Concern Present (06/08/2024)  Received from [facility] - Occupational Stress Questionnaire    Do you feel stress - tense, restless, nervous, or anxious, or unable to sleep at night because your mind is troubled all the time - these days?: Not at all  Social Connections: Socially Integrated (06/08/2024)   Received from [facility]   Social Network    How would you rate your social network (family, work, friends)?: Good participation with social networks  Intimate Partner Violence: Not At Risk (06/08/2024)   Received from [facility]   HITS    Over the last 12 months how often did your partner physically hurt you?: Never    Over the last 12 months how often did your partner insult you or talk down to you?: Never    Over the last 12 months how often did your partner threaten you with physical harm?: Never    Over the last 12 months  how often did your partner scream or curse at you?: Never    Review of Systems:  All other review of systems negative except as mentioned in the HPI.  Physical Exam: Vital signs in last 24 hours: BP 119/67   Pulse 92   Temp 97.7 F (36.5 C) (Temporal)   Ht 5' 11 (1.803 m)   Wt 248 lb (112.5 kg)   SpO2 96%   BMI 34.59 kg/m  General:   Alert, NAD Lungs:  Clear .   Heart:  Regular rate and rhythm Abdomen:  Soft, nontender and nondistended. Neuro/Psych:  Alert and cooperative. Normal mood and affect. A and O x 3  Reviewed labs, radiology imaging, old records and pertinent past GI work up  Patient is appropriate for planned procedure(s) and anesthesia in an ambulatory setting   K. Veena Phung Kotas , MD [phone]

## 2024-06-20 NOTE — Progress Notes (Signed)
 Called to room to assist during endoscopic procedure.  Patient ID and intended procedure confirmed with present staff. Received instructions for my participation in the procedure from the performing physician.

## 2024-06-20 NOTE — Progress Notes (Signed)
 Pt's states no medical or surgical changes since previsit or office visit.

## 2024-06-20 NOTE — Op Note (Signed)
 [facility] Patient Name: [name] Procedure Date: 06/20/2024 8:22 AM MRN: [id number] Endoscopist: Gustav ALONSO Mcgee , MD, [id number] Age: 58 Referring MD:  Date of Birth: 13-Jan-1966 Gender: Male Account #: [id number] Procedure:                Colonoscopy Indications:              High risk colon cancer surveillance: Personal                            history of adenoma (10 mm or greater in size), High                            risk colon cancer surveillance: Personal history of                            multiple (3 or more) adenomas Medicines:                Monitored Anesthesia Care Procedure:                Pre-Anesthesia Assessment:                           - Prior to the procedure, a History and Physical                            was performed, and patient medications and                            allergies were reviewed. The patient's tolerance of                            previous anesthesia was also reviewed. The risks                            and benefits of the procedure and the sedation                            options and risks were discussed with the patient.                            All questions were answered, and informed consent                            was obtained. Prior Anticoagulants: The patient has                            taken no anticoagulant or antiplatelet agents. ASA                            Grade Assessment: II - A patient with mild systemic                            disease. After reviewing the risks and benefits,  the patient was deemed in satisfactory condition to                            undergo the procedure.                           After obtaining informed consent, the colonoscope                            was passed under direct vision. Throughout the                            procedure, the patient's blood pressure, pulse, and                            oxygen saturations were  monitored continuously. The                            [id number]-HQ190L Colonoscope [id number] was introduced                            through the anus and advanced to the the cecum,                            identified by appendiceal orifice and ileocecal                            valve. The colonoscopy was performed without                            difficulty. The patient tolerated the procedure                            well. The quality of the bowel preparation was                            good. The ileocecal valve, appendiceal orifice, and                            rectum were photographed. Scope In: 8:45:04 AM Scope Out: 8:59:43 AM Scope Withdrawal Time: 0 hours 10 minutes 12 seconds  Total Procedure Duration: 0 hours 14 minutes 39 seconds  Findings:                 The perianal and digital rectal examinations were                            normal.                           Two sessile polyps were found in the transverse                            colon and ascending colon. The polyps were 5 to 7  mm in size. These polyps were removed with a cold                            snare. Resection and retrieval were complete.                           Non-bleeding external and internal hemorrhoids were                            found during retroflexion. The hemorrhoids were                            medium-sized. Complications:            No immediate complications. Estimated Blood Loss:     Estimated blood loss was minimal. Impression:               - Two 5 to 7 mm polyps in the transverse colon and                            in the ascending colon, removed with a cold snare.                            Resected and retrieved.                           - Non-bleeding external and internal hemorrhoids. Recommendation:           - Resume previous diet.                           - Continue present medications.                           - Repeat colonoscopy in 5  years for surveillance                            based on pathology results. Tyneshia Stivers V. Ceasar Decandia, MD 06/20/2024 9:18:52 AM This report has been signed electronically.

## 2024-06-20 NOTE — Progress Notes (Signed)
 Report given to PACU, vss

## 2024-06-20 NOTE — Patient Instructions (Addendum)
 Resume previous diet.                           - Continue present medications.                           - Repeat colonoscopy in 5 years for surveillance                            based on pathology results. Kavitha V. Nandigam, MD      YOU HAD AN ENDOSCOPIC PROCEDURE TODAY AT THE [facility]:   Refer to the procedure report that was given to you for any specific questions about what was found during the examination.  If the procedure report does not answer your questions, please call your gastroenterologist to clarify.  If you requested that your care partner not be given the details of your procedure findings, then the procedure report has been included in a sealed envelope for you to review at your convenience later.  YOU SHOULD EXPECT: Some feelings of bloating in the abdomen. Passage of more gas than usual.  Walking can help get rid of the air that was put into your GI tract during the procedure and reduce the bloating. If you had a lower endoscopy (such as a colonoscopy or flexible sigmoidoscopy) you may notice spotting of blood in your stool or on the toilet paper. If you underwent a bowel prep for your procedure, you may not have a normal bowel movement for a few days.  Please Note:  You might notice some irritation and congestion in your nose or some drainage.  This is from the oxygen used during your procedure.  There is no need for concern and it should clear up in a day or so.  SYMPTOMS TO REPORT IMMEDIATELY:  Following lower endoscopy (colonoscopy or flexible sigmoidoscopy):  Excessive amounts of blood in the stool  Significant tenderness or worsening of abdominal pains  Swelling of the abdomen that is new, acute  Fever of 100F or higher   For urgent or emergent issues, a gastroenterologist can be reached at any hour by calling [phone]. Do not use MyChart messaging for urgent concerns.    DIET:  We do recommend a small meal at first, but then you may  proceed to your regular diet.  Drink plenty of fluids but you should avoid alcoholic beverages for 24 hours.  ACTIVITY:  You should plan to take it easy for the rest of today and you should NOT DRIVE or use heavy machinery until tomorrow (because of the sedation medicines used during the test).    FOLLOW UP: Our staff will call the number listed on your records the next business day following your procedure.  We will call around 7:15- 8:00 am to check on you and address any questions or concerns that you may have regarding the information given to you following your procedure. If we do not reach you, we will leave a message.     If any biopsies were taken you will be contacted by phone or by letter within the next 1-3 weeks.  Please call us  at [phone] if you have not heard about the biopsies in 3 weeks.    SIGNATURES/CONFIDENTIALITY: You and/or your care partner have signed paperwork which will be entered into your electronic medical record.  These signatures attest to the  fact that that the information above on your After Visit Summary has been reviewed and is understood.  Full responsibility of the confidentiality of this discharge information lies with you and/or your care-partner.

## 2024-06-24 ENCOUNTER — Telehealth: Payer: Self-pay

## 2024-06-24 NOTE — Telephone Encounter (Signed)
  Follow up Call-     06/20/2024    8:08 AM  Call back number  Post procedure Call Back phone  # [phone]  Permission to leave phone message Yes     Patient questions:  Do you have a fever, pain , or abdominal swelling? No. Pain Score  0 *  Have you tolerated food without any problems? Yes.    Have you been able to return to your normal activities? Yes.    Do you have any questions about your discharge instructions: Diet   No. Medications  No. Follow up visit  No.  Do you have questions or concerns about your Care? No.  Actions: * If pain score is 4 or above: No action needed, pain <4.

## 2024-06-25 LAB — SURGICAL PATHOLOGY

## 2024-06-30 ENCOUNTER — Telehealth: Payer: Self-pay | Admitting: Cardiology

## 2024-06-30 DIAGNOSIS — M7989 Other specified soft tissue disorders: Secondary | ICD-10-CM

## 2024-06-30 NOTE — Telephone Encounter (Signed)
 Spoke with the patient's wife who states that the patient's swelling in his feet and legs have worsened. She states that he has been elevating his feet as much as he can. He has been wearing compression stockings as well. He is taking Lasix  20 mg once daily. He has gained about 8-10 pounds in the last month. Denies any shortness of breath. Denies any increase in sodium intake. She states that typically when he elevates his legs and when he gets up in the morning his swelling goes down however recently it has not helped. Patient was last seen about a month ago by Dr. Lavona   Edema: His lower extremity swelling is increased.  I am going to have him take his Lasix  40 mg daily for the next 3 days.  If he is not responding to this he is gena let me know and I might change his diuretic further.   Wife states that when he increased his lasix  it did help with swelling. She also notes that he had a colonoscopy recently and feels like swelling has worsened since then.  Will send message to Dr. Lavona for advisement.

## 2024-06-30 NOTE — Telephone Encounter (Signed)
  Pt c/o swelling/edema: STAT if pt has developed SOB within 24 hours  If swelling, where is the swelling located? swelling in legs & feet   How much weight have you gained and in what time span? 8-10 lbs since 08/07  Have you gained 2 pounds in a day or 5 pounds in a week? 8-10 lbs since 08/07  Do you have a log of your daily weights (if so, list)? None   Are you currently taking a fluid pill? Yes   Are you currently SOB? No   Have you traveled recently in a car or plane for an extended period of time? No

## 2024-07-01 MED ORDER — TORSEMIDE 40 MG PO TABS: 40.0000 mg | ORAL_TABLET | Freq: Every day | ORAL | 3 refills | Status: DC

## 2024-07-01 NOTE — Telephone Encounter (Signed)
 Spoke with the patient's wife and provided recommendations from Dr. Lavona. She verbalized understanding and is agreeable to the plan. Prescription has been sent in and labs have been ordered.

## 2024-07-03 ENCOUNTER — Other Ambulatory Visit: Payer: Self-pay | Admitting: Cardiology

## 2024-07-09 DIAGNOSIS — M7989 Other specified soft tissue disorders: Secondary | ICD-10-CM | POA: Diagnosis not present

## 2024-07-10 ENCOUNTER — Ambulatory Visit: Payer: Self-pay | Admitting: Cardiology

## 2024-07-10 DIAGNOSIS — R601 Generalized edema: Secondary | ICD-10-CM

## 2024-07-10 DIAGNOSIS — I2510 Atherosclerotic heart disease of native coronary artery without angina pectoris: Secondary | ICD-10-CM

## 2024-07-10 DIAGNOSIS — E876 Hypokalemia: Secondary | ICD-10-CM

## 2024-07-10 LAB — BASIC METABOLIC PANEL WITH GFR
BUN/Creatinine Ratio: 22 — ABNORMAL HIGH (ref 9–20)
BUN: 23 mg/dL (ref 6–24)
CO2: 25 mmol/L (ref 20–29)
Calcium: 9.5 mg/dL (ref 8.7–10.2)
Chloride: 99 mmol/L (ref 96–106)
Creatinine, Ser: 1.06 mg/dL (ref 0.76–1.27)
Glucose: 179 mg/dL — ABNORMAL HIGH (ref 70–99)
Potassium: 3.4 mmol/L — ABNORMAL LOW (ref 3.5–5.2)
Sodium: 138 mmol/L (ref 134–144)
eGFR: 81 mL/min/1.73 (ref >59)

## 2024-07-11 MED ORDER — POTASSIUM CHLORIDE ER 10 MEQ PO TBCR: 20.0000 meq | EXTENDED_RELEASE_TABLET | Freq: Every day | ORAL | 3 refills | Status: DC

## 2024-07-11 NOTE — Telephone Encounter (Signed)
-----   Message from Lynwood Schilling sent at 07/10/2024  8:44 PM EDT ----- Please call in Kdur 20 meq PO daily and repeat a BMET in 2 weeks.  Call Mr. Gunderson with the results and send results to Magdaline Debby HERO, MD ----- Message ----- From: Interface, [facility] Lab Results In Sent: 07/10/2024   3:36 AM EDT To: Lynwood Schilling, MD

## 2024-07-14 ENCOUNTER — Ambulatory Visit: Admitting: Adult Health

## 2024-07-14 ENCOUNTER — Ambulatory Visit (INDEPENDENT_AMBULATORY_CARE_PROVIDER_SITE_OTHER)

## 2024-07-14 ENCOUNTER — Other Ambulatory Visit: Payer: Self-pay | Admitting: Cardiology

## 2024-07-14 ENCOUNTER — Encounter: Payer: Self-pay | Admitting: Adult Health

## 2024-07-14 VITALS — BP 116/72 | HR 88 | Temp 98.4°F | Ht 71.0 in | Wt 235.4 lb

## 2024-07-14 DIAGNOSIS — Z794 Long term (current) use of insulin: Secondary | ICD-10-CM

## 2024-07-14 DIAGNOSIS — G4733 Obstructive sleep apnea (adult) (pediatric): Secondary | ICD-10-CM | POA: Diagnosis not present

## 2024-07-14 DIAGNOSIS — R053 Chronic cough: Secondary | ICD-10-CM | POA: Diagnosis not present

## 2024-07-14 DIAGNOSIS — E109 Type 1 diabetes mellitus without complications: Secondary | ICD-10-CM

## 2024-07-14 DIAGNOSIS — J189 Pneumonia, unspecified organism: Secondary | ICD-10-CM

## 2024-07-14 DIAGNOSIS — K219 Gastro-esophageal reflux disease without esophagitis: Secondary | ICD-10-CM

## 2024-07-14 DIAGNOSIS — J4550 Severe persistent asthma, uncomplicated: Secondary | ICD-10-CM | POA: Diagnosis not present

## 2024-07-14 DIAGNOSIS — J309 Allergic rhinitis, unspecified: Secondary | ICD-10-CM | POA: Diagnosis not present

## 2024-07-14 DIAGNOSIS — E663 Overweight: Secondary | ICD-10-CM

## 2024-07-14 MED ORDER — TRELEGY ELLIPTA 200-62.5-25 MCG/ACT IN AEPB: 1.0000 | INHALATION_SPRAY | Freq: Every day | RESPIRATORY_TRACT | 3 refills | Status: AC

## 2024-07-14 MED ORDER — BUDESONIDE 0.5 MG/2ML IN SUSP: 0.5000 mg | Freq: Two times a day (BID) | RESPIRATORY_TRACT | 11 refills | Status: AC

## 2024-07-14 MED ORDER — METOLAZONE 2.5 MG PO TABS: 2.5000 mg | ORAL_TABLET | Freq: Every day | ORAL | 0 refills | Status: DC

## 2024-07-14 NOTE — Patient Instructions (Addendum)
 Increase Trelegy 200mcg 1 puff daily, rinse after use.  Albuterol  inahler As needed   Albuterol  neb As needed   Allegra and Singulair  daily  Continue on Pepcid 20mg  Twice daily   Budesonide  neb Twice daily   Dupixent  injection every 2 weeks Robitussin DM 1 tsp 4hr As needed  for cough  Tessalon  Three times a day  as needed for cough  Sips of water  to soothe throat.  Chest xray today  Flu shot this fall as discussed.  Continue on CPAP At bedtime  Saline nasal spray Twice daily   Saline nasal gel At bedtime   Follow up with Dr. Neda or Quiara Killian NP in 3 months-PFT  Please contact office for sooner follow up if symptoms do not improve or worsen or seek emergency care

## 2024-07-14 NOTE — Addendum Note (Signed)
 Addended by: Lashan Macias N on: 07/14/2024 06:02 PM   Modules accepted: Orders

## 2024-07-14 NOTE — Progress Notes (Signed)
 @Patient  ID: [name], male    DOB: 08/09/1966, 58 y.o.   MRN: [id number]  Chief Complaint  Patient presents with   Obstructive Sleep Apnea    4-6 week follow up,dupixent  start    Referring provider: Magdaline Debby HERO, MD  HPI: 58 year old male never smoker followed for asthma with allergic phenotype and obstructive sleep apnea Medical history significant for type 1 diabetes-insulin -dependent, CAD   Asthma:  Never smoker Works in [employer] 1 dog -inside  No basement or hot tub No known Autoimmune/CTD (other than Type 1 DM)    TEST/EVENTS :  PFTs were normal in 2022 / FEV1 84% predicated, increased diffusion capacity. No BD response.  -CT chest 5/24 showed no acute process, mild airway thickening    Allergy  panel 2024 +tree, ragweed, grass (IgE 145) , Eos 630   HST 07/2021-AHI 13.5/hr.   07/14/2024 Follow up : Asthma exacerbation, OSA Discussed the use of AI scribe software for clinical note transcription with the patient, who gave verbal consent to proceed.  History of Present Illness [name] is a 58 year old male with asthma and obstructive sleep apnea who presents for a two month follow-up.  He has a history of asthma with an allergic phenotype. Last visit was experiencing a slow to resolve asthmatic bronchitic exacerbation-despite maximum treatment including  Trelegy inhaler daily, Singulair , and Zyrtec, along with several courses of antibiotics and prednisone . At the last visit, his regimen was adjusted to include a prednisone  taper, Dupixent  injections every two weeks, and a switch from Zyrtec to Allegra. Budesonide  nebulizer was also added. Since last visit he is much improved.  He has not required prednisone  for two months.  He uses Trelegy inhaler once daily  He also uses an albuterol  inhaler and nebulizer as needed, with instructions to use the nebulizer twice daily due to frequent flare-ups. He continues to use Pepcid twice daily for reflux, which is  exacerbated by coughing. He has been using budesonide  nebulizer twice daily and has started Dupixent  injections, noticing some improvement in symptoms. Cough and wheezing are decreased.   He has obstructive sleep apnea and uses a CPAP machine at night, achieving about four hours of use per night with a compliance rate of 53%. He uses a full face mask and has experienced difficulty wearing the CPAP due to coughing, but this has improved recently. His CPAP settings are 4 to 15 cm H2O with an AHI of 2.8/hour and an average pressure of 10 cm H2O.  He has a history of type 1 diabetes since age 54 and uses an insulin  pump. His blood sugar levels have improved since not requiring frequent prednisone , with his A1c decreasing from 7.9 to 7.2. He experiences morning congestion,  and finds that it improves with movement and exercise.  He has been able to start exercising daily since last visit.   He has a history of pneumonia, chest xray Mar 19, 2024 showed LLL pneumonia. We discussed follow up chest xray today.   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 He lives in a single-floor townhouse without a basement or hot tub.   No autoimmune conditions other than type 1 diabetes.     No Known Allergies  Immunization History  Administered Date(s) Administered   Fluad Trivalent(High Dose 65+) 09/05/2016   Influenza Inj Mdck Quad Pf 08/09/2015, 07/16/2019   Influenza Split 08/09/2015   Influenza, Seasonal, Injecte, Preservative Fre 09/05/2016, 09/24/2023   Influenza,inj,Quad PF,6-35 Mos 08/16/2017  Influenza,inj,quad, With Preservative 08/16/2017   Influenza-Unspecified 07/23/2013, 09/05/2016, 08/16/2017, 07/23/2021   Moderna Sars-Covid-2 Vaccination 09/06/2020, 05/27/2021   Pneumococcal Conjugate-13 11/19/2013   Pneumococcal Polysaccharide-23 06/30/2009   Tdap 05/21/2012    Past Medical History:  Diagnosis Date   Asthma    Coronary artery disease    s/p NSTEMI after ETT-echo - [facility] 7/13:   LM < 10%, pLAD 90%, mLAD 95%, pCFX tandem 90%, mCFX 80-90%, pRCA 30%, mRCA 40-50%;  PCI: Xience DES x 2 to prox and mid LAD; Xience DES x 1 to prox and mid CFX   Family history of anesthesia complication    nausea    GERD (gastroesophageal reflux disease)    HLD (hyperlipidemia)    Hypertension    Lung nodule 2013   CT 7/13: 3 mm RML nodule - felt to be stable and benign/ follow up in 2014   Myocardial infarction [facility]) 2013   have 4 stents   S/P coronary artery stent placement    Sleep apnea    Type I diabetes mellitus (HCC)    insulin  dependent    Tobacco History: Social History   Tobacco Use  Smoking Status Never   Passive exposure: Past  Smokeless Tobacco Never   Counseling given: Not Answered   Outpatient Medications Prior to Visit  Medication Sig Dispense Refill   ACCU-CHEK AVIVA PLUS test strip 1 each by Other route as needed.      albuterol  (PROVENTIL  HFA;VENTOLIN  HFA) 108 (90 Base) MCG/ACT inhaler Inhale 1 puff into the lungs every 6 (six) hours as needed for wheezing or shortness of breath.     albuterol  (PROVENTIL ) (2.5 MG/3ML) 0.083% nebulizer solution Take 3 mLs (2.5 mg total) by nebulization every 6 (six) hours as needed for wheezing or shortness of breath. 75 mL 5   aspirin  81 MG tablet Take 81 mg by mouth daily.     azelastine (ASTELIN) 0.1 % nasal spray Place 1 spray into the nose daily.     benzonatate  (TESSALON ) 100 MG capsule Take 1 capsule (100 mg total) by mouth 3 (three) times daily as needed for cough. 45 capsule 0   Coenzyme Q10 100 MG capsule Take by mouth.     Continuous Blood Gluc Sensor (DEXCOM G6 SENSOR) MISC      Dupilumab  (DUPIXENT ) 300 MG/2ML SOAJ Inject 300 mg into the skin every 14 (fourteen) days. **loading dose received in clinic on 05/20/2024** 12 mL 1   fluticasone  (FLONASE ) 50 MCG/ACT nasal spray SHAKE LIQUID AND USE 1 SPRAY IN EACH NOSTRIL DAILY 16 g 3   furosemide  (LASIX ) 20 MG tablet Take 20 mg by mouth daily.     insulin  aspart  (NOVOLOG  FLEXPEN) 100 UNIT/ML FlexPen To be inject 3-4 times daily for meals or snacks at directed, up to 100 units daily.     isosorbide  mononitrate (IMDUR ) 60 MG 24 hr tablet TAKE 1 TABLET(60 MG) BY MOUTH DAILY 90 tablet 2   losartan  (COZAAR ) 25 MG tablet TAKE 1 TABLET(25 MG) BY MOUTH DAILY 90 tablet 3   montelukast  (SINGULAIR ) 10 MG tablet TAKE 1 TABLET(10 MG) BY MOUTH AT BEDTIME 90 tablet 1   nitroGLYCERIN  (NITROSTAT ) 0.4 MG SL tablet DISSOLVE 1 TABLET UNDER TONGUE EVERY 5 MINUTES AS NEEDED FOR CHEST PAIN AS DIRECTED 25 tablet 8   potassium chloride  (KLOR-CON ) 10 MEQ tablet Take 2 tablets (20 mEq total) by mouth daily. 90 tablet 3   REPATHA  140 MG/ML SOSY INJECT 140 MG EVERY 14 DAYS 6 mL 3  rosuvastatin  (CRESTOR ) 5 MG tablet TAKE 1 TABLET(5 MG) BY MOUTH DAILY AT 6 PM 90 tablet 0   Torsemide  40 MG TABS Take 40 mg by mouth daily. 90 tablet 3   budesonide  (PULMICORT ) 0.5 MG/2ML nebulizer solution Take 2 mLs (0.5 mg total) by nebulization 2 (two) times daily. 60 mL 3   TRELEGY ELLIPTA  100-62.5-25 MCG/ACT AEPB INHALE 1 PUFF INTO THE LUNGS DAILY 60 each 3   Fluticasone -Umeclidin-Vilant (TRELEGY ELLIPTA ) 200-62.5-25 MCG/ACT AEPB Inhale 1 puff into the lungs daily. (Patient not taking: Reported on 07/14/2024) 2 each 0   Multiple Vitamins-Minerals (AIRBORNE GUMMIES) CHEW Chew 3 capsules by mouth daily. (Patient not taking: Reported on 07/14/2024)     predniSONE  (DELTASONE ) 10 MG tablet 4 tabs for 2 days, then 3 tabs for 2 days, 2 tabs for 2 days, then 1 tab for 2 days, then stop (Patient not taking: Reported on 07/14/2024) 20 tablet 0   promethazine -dextromethorphan (PROMETHAZINE -DM) 6.25-15 MG/5ML syrup Take 5 mLs by mouth 4 (four) times daily as needed for cough. 240 mL 0   No facility-administered medications prior to visit.     Review of Systems:   Constitutional:   No  weight loss, night sweats,  Fevers, chills, fatigue, or  lassitude.  HEENT:   No headaches,  Difficulty swallowing,   Tooth/dental problems, or  Sore throat,                No sneezing, itching, ear ache, +nasal congestion, post nasal drip,   CV:  No chest pain,  Orthopnea, PND, swelling in lower extremities, anasarca, dizziness, palpitations, syncope.   GI  No heartburn, indigestion, abdominal pain, nausea, vomiting, diarrhea, change in bowel habits, loss of appetite, bloody stools.   Resp: .  No chest wall deformity  Skin: no rash or lesions.  GU: no dysuria, change in color of urine, no urgency or frequency.  No flank pain, no hematuria   MS:  No joint pain or swelling.  No decreased range of motion.  No back pain.    Physical Exam  BP 116/72   Pulse 88   Temp 98.4 F (36.9 C) (Oral)   Ht 5' 11 (1.803 m)   Wt 235 lb 6.4 oz (106.8 kg)   SpO2 94%   BMI 32.83 kg/m   GEN: A/Ox3; pleasant , NAD, well nourished    HEENT:  [place]/AT,   NOSE-clear, THROAT-clear, no lesions, no postnasal drip or exudate noted.   NECK:  Supple w/ fair ROM; no JVD; normal carotid impulses w/o bruits; no thyromegaly or nodules palpated; no lymphadenopathy.    RESP  Clear  P & A; w/o, wheezes/ rales/ or rhonchi. no accessory muscle use, no dullness to percussion  CARD:  RRR, no m/r/g, no peripheral edema, pulses intact, no cyanosis or clubbing.  GI:   Soft & nt; nml bowel sounds; no organomegaly or masses detected.   Musco: Warm bil, no deformities or joint swelling noted.   Neuro: alert, no focal deficits noted.    Skin: Warm, no lesions or rashes    Lab Results:  CBC   BMET  BNP No results found for: BNP  ProBNP Imaging: No results found.  Administration History     None          Latest Ref Rng & Units 10/06/2021   10:48 AM  PFT Results  FVC-Pre L 3.89   FVC-Predicted Pre % 76   FVC-Post L 4.08   FVC-Predicted Post % 80   Pre  FEV1/FVC % % 82   Post FEV1/FCV % % 80   FEV1-Pre L 3.17   FEV1-Predicted Pre % 82   FEV1-Post L 3.25   DLCO uncorrected ml/min/mmHg 36.73   DLCO UNC%  % 125   DLCO corrected ml/min/mmHg 36.73   DLCO COR %Predicted % 125   DLVA Predicted % 141   TLC L 7.16   TLC % Predicted % 99   RV % Predicted % 142     No results found for: NITRICOXIDE      Assessment & Plan:   No problem-specific Assessment & Plan notes found for this encounter.  Assessment and Plan Assessment & Plan Asthma with allergic phenotype and chronic cough   Asthma with an allergic phenotype and chronic cough has shown improvement with the current regimen of Trelegy, Dupixent , and budesonide  nebulizer. No prednisone  use for two months has also improved glycemic control. Dupixent  injections seem less effective towards the end of the dosing interval. Cough is managed with Robitussin DM, Tessalon , and honey cough drops. Increase Trelegy to 200 mcg daily. Continue Dupixent  injections every two weeks and budesonide  nebulizer twice daily. Change albuterol  nebulizer to as needed use. Refill Dupixent  and budesonide  prescriptions. Order a chest x-ray to follow up on previous pneumonia. Plan to reduce budesonide  to once daily if well-controlled in three months. Goal to wean totally off budesonide  going forward if able, to use during flares only as long as we can avoid oral steroids. Check PFT on return  Chronic rhinitis with Sinus congestion and postnasal drip   Sinus congestion and postnasal drip contribute to morning cough and discomfort, potentially exacerbated by CPAP use and chronic allergies . Use saline nasal gel and saline rinses to alleviate symptoms. Continue on allergy  regimen with Allegra .   Obstructive sleep apnea   Obstructive sleep apnea is managed with CPAP, with compliance at 53% and average usage of four hours per night. CPAP pressure adjustment is discussed to improve comfort and compliance. Nasal congestion and postnasal drip may contribute to discomfort with CPAP use. Lower CPAP pressure to improve comfort. Encourage use of saline nasal gel and saline rinses to  alleviate nasal congestion. Advise on CPAP maintenance, including using distilled water  and cleaning the mask regularly. Change to CPAP auto 5-10cmH2o.   Gastroesophageal reflux disease   Gastroesophageal reflux disease is managed with Pepcid,   as reflux can exacerbate asthma symptoms. Continue Pepcid 20 mg twice daily.  Pneumonia   Previous LLL pneumonia requires follow-up imaging to ensure resolution/clearance on xray. Order a chest x-ray to follow up on previous pneumonia.  Overweight   Overweight status likely contributes to asthma and sleep apnea. Weight management is complicated by diabetes and previous steroid use. Exercise is encouraged to improve overall health and potentially aid in weight loss. Encourage daily exercise, aiming for 30 minutes of activity per day. Discuss potential weight management strategies with the endocrinology team.  Type 1 DM-continue follow up with endocrinology. Avoid steroids if possible.   Plan  Patient Instructions  Increase Trelegy 200mcg 1 puff daily, rinse after use.  Albuterol  inahler As needed   Albuterol  neb As needed   Allegra and Singulair  daily  Continue on Pepcid 20mg  Twice daily   Budesonide  neb Twice daily   Dupixent  injection every 2 weeks Robitussin DM 1 tsp 4hr As needed  for cough  Tessalon  Three times a day  as needed for cough  Sips of water  to soothe throat.  Chest xray today  Flu shot this  fall as discussed.  Continue on CPAP At bedtime  Saline nasal spray Twice daily   Saline nasal gel At bedtime   Follow up with Dr. Neda or Ames Hoban NP in 3 months-PFT  Please contact office for sooner follow up if symptoms do not improve or worsen or seek emergency care        I spent  43  minutes dedicated to the care of this patient on the date of this encounter to include pre-visit review of records, face-to-face time with the patient discussing conditions above, post visit ordering of testing, clinical documentation with the  electronic health record, making appropriate referrals as documented, and communicating necessary findings to members of the patients care team.    Madelin Stank, NP 07/14/2024

## 2024-07-16 ENCOUNTER — Ambulatory Visit: Payer: Self-pay | Admitting: Adult Health

## 2024-07-17 NOTE — Telephone Encounter (Signed)
**Note De-identified  [place] Obfuscation** Please advise 

## 2024-07-21 DIAGNOSIS — R601 Generalized edema: Secondary | ICD-10-CM | POA: Diagnosis not present

## 2024-07-22 LAB — BASIC METABOLIC PANEL WITH GFR
BUN/Creatinine Ratio: 27 — ABNORMAL HIGH (ref 9–20)
BUN: 31 mg/dL — ABNORMAL HIGH (ref 6–24)
CO2: 23 mmol/L (ref 20–29)
Calcium: 10.2 mg/dL (ref 8.7–10.2)
Chloride: 94 mmol/L — ABNORMAL LOW (ref 96–106)
Creatinine, Ser: 1.13 mg/dL (ref 0.76–1.27)
Glucose: 125 mg/dL — ABNORMAL HIGH (ref 70–99)
Potassium: 3.2 mmol/L — ABNORMAL LOW (ref 3.5–5.2)
Sodium: 138 mmol/L (ref 134–144)
eGFR: 75 mL/min/1.73 (ref >59)

## 2024-07-24 DIAGNOSIS — I10 Essential (primary) hypertension: Secondary | ICD-10-CM | POA: Diagnosis not present

## 2024-07-24 MED ORDER — POTASSIUM CHLORIDE CRYS ER 20 MEQ PO TBCR: 60.0000 meq | EXTENDED_RELEASE_TABLET | Freq: Every day | ORAL | 3 refills | Status: DC

## 2024-07-24 NOTE — Addendum Note (Signed)
 Addended by: Damany Eastman N on: 07/24/2024 05:03 PM   Modules accepted: Orders

## 2024-07-25 ENCOUNTER — Other Ambulatory Visit: Payer: Self-pay

## 2024-07-25 DIAGNOSIS — E876 Hypokalemia: Secondary | ICD-10-CM

## 2024-07-25 NOTE — Addendum Note (Signed)
 Addended by: Vaniyah Lansky N on: 07/25/2024 07:59 AM   Modules accepted: Orders

## 2024-07-31 ENCOUNTER — Other Ambulatory Visit: Payer: Self-pay | Admitting: Primary Care

## 2024-08-02 LAB — BASIC METABOLIC PANEL WITH GFR
BUN/Creatinine Ratio: 16 (ref 9–20)
BUN: 19 mg/dL (ref 6–24)
CO2: 23 mmol/L (ref 20–29)
Calcium: 9.5 mg/dL (ref 8.7–10.2)
Chloride: 100 mmol/L (ref 96–106)
Creatinine, Ser: 1.17 mg/dL (ref 0.76–1.27)
Glucose: 84 mg/dL (ref 70–99)
Potassium: 4.7 mmol/L (ref 3.5–5.2)
Sodium: 138 mmol/L (ref 134–144)
eGFR: 72 mL/min/1.73 (ref >59)

## 2024-08-07 ENCOUNTER — Other Ambulatory Visit: Payer: Self-pay | Admitting: Internal Medicine

## 2024-08-07 DIAGNOSIS — E876 Hypokalemia: Secondary | ICD-10-CM | POA: Diagnosis not present

## 2024-08-07 LAB — BASIC METABOLIC PANEL WITH GFR
BUN/Creatinine Ratio: 17 (ref 9–20)
BUN: 18 mg/dL (ref 6–24)
CO2: 23 mmol/L (ref 20–29)
Calcium: 9.8 mg/dL (ref 8.7–10.2)
Chloride: 100 mmol/L (ref 96–106)
Creatinine, Ser: 1.08 mg/dL (ref 0.76–1.27)
Glucose: 129 mg/dL — ABNORMAL HIGH (ref 70–99)
Potassium: 4.3 mmol/L (ref 3.5–5.2)
Sodium: 141 mmol/L (ref 134–144)
eGFR: 79 mL/min/1.73 (ref >59)

## 2024-08-10 ENCOUNTER — Other Ambulatory Visit: Payer: Self-pay | Admitting: Cardiology

## 2024-08-12 ENCOUNTER — Telehealth: Payer: Self-pay | Admitting: Cardiology

## 2024-08-12 MED ORDER — METOLAZONE 2.5 MG PO TABS: 2.5000 mg | ORAL_TABLET | Freq: Every day | ORAL | 2 refills | Status: AC

## 2024-08-12 NOTE — Telephone Encounter (Signed)
 RX sent in

## 2024-08-12 NOTE — Telephone Encounter (Signed)
*  STAT* If patient is at the pharmacy, call can be transferred to refill team.   1. Which medications need to be refilled? (please list name of each medication and dose if known)   metolazone  (ZAROXOLYN ) 2.5 MG tablet    4. Which pharmacy/location (including street and city if local pharmacy) is medication to be sent to?  [id number] - [place], [place] - [address] AT [address]     5. Do they need a 30 day or 90 day supply? 90

## 2024-08-13 ENCOUNTER — Encounter: Payer: Self-pay | Admitting: *Deleted

## 2024-08-14 ENCOUNTER — Ambulatory Visit: Attending: Internal Medicine | Admitting: Emergency Medicine

## 2024-08-14 ENCOUNTER — Encounter: Payer: Self-pay | Admitting: Emergency Medicine

## 2024-08-14 VITALS — BP 120/70 | HR 90 | Ht 71.0 in | Wt 248.6 lb

## 2024-08-14 DIAGNOSIS — I10 Essential (primary) hypertension: Secondary | ICD-10-CM | POA: Diagnosis not present

## 2024-08-14 DIAGNOSIS — M7989 Other specified soft tissue disorders: Secondary | ICD-10-CM

## 2024-08-14 DIAGNOSIS — I2510 Atherosclerotic heart disease of native coronary artery without angina pectoris: Secondary | ICD-10-CM | POA: Diagnosis not present

## 2024-08-14 DIAGNOSIS — E785 Hyperlipidemia, unspecified: Secondary | ICD-10-CM

## 2024-08-14 MED ORDER — TORSEMIDE 20 MG PO TABS: 20.0000 mg | ORAL_TABLET | Freq: Every day | ORAL | 3 refills | Status: AC

## 2024-08-14 MED ORDER — POTASSIUM CHLORIDE CRYS ER 20 MEQ PO TBCR: 40.0000 meq | EXTENDED_RELEASE_TABLET | Freq: Every day | ORAL | 3 refills | Status: DC

## 2024-08-14 NOTE — Progress Notes (Signed)
 Cardiology Office Note:    Date:  08/14/2024  ID:  [name], DOB 07-16-1966, MRN [id number] PCP: Magdaline Debby HERO, MD  [facility] Providers Cardiologist:  Lynwood Schilling, MD Cardiology APP:  Rana Lum CROME, NP       Patient Profile:       Chief Complaint: Follow-up for lower extremity swelling History of Present Illness:  [name] is a 58 y.o. male with visit-pertinent history of coronary artery disease s/p multivessel PCI, hyperlipidemia, hypertension, GERD, T2DM  He underwent stress testing in 2017.  He has a history of multivessel PCI. Most recent cardiac cath 09/2019 with single vessel occlusive CAD with stent in prox and mid LAD patent and occluded second OM at site of prior stent with som left-left collaterals and segmental 50% prox to mid RCA stenosis which was unchanged from 2013. He was recommended for medical management.   He was last seen in clinic on 05/29/2024.  He reported significant problem with asthma as he has been on steroids making his diabetes out-of-control.  He denied any chest discomfort and no new shortness of breath.  He has had increased lower extremity edema.  His Lasix  was increased to 40 mg daily.  He was to follow-up in 4 months.  He called into nurse triage on 06/30/2024 reporting worsening edema and weight gain.  He was started on torsemide  40 mg daily.   Discussed the use of AI scribe software for clinical note transcription with the patient, who gave verbal consent to proceed.  History of Present Illness [name] is a 58 year old male with coronary artery disease who presents with lower extremity swelling and shortness of breath.  Today he is doing exceptionally well much improved since last office visit  His lower extremity swelling has vastly improved and has since resolved.  Managed with torsemide  40 mg daily, and uses compression socks, especially after prolonged driving. His weight has decreased from 257 lbs to  243 pounds and is stable per his home scale.  He takes potassium supplements, 60 mEq daily, due to previous low potassium levels.  He was diagnosed with asthma and started on Dupixent  injections, significantly improving his respiratory symptoms. He no longer requires steroids and has resumed exercising, including treadmill and biking, for about an hour daily. He denies current chest pain and reports improved shortness of breath, aided by regular breathing exercises.  He denies any orthopnea, PND, syncope, dyspnea, lightheadedness, dizziness, palpitations.   Review of systems:  Please see the history of present illness. All other systems are reviewed and otherwise negative.      Studies Reviewed:        Echocardiogram 05/03/2023  1. Left ventricular ejection fraction, by estimation, is 55 to 60%. The  left ventricle has normal function. The left ventricle has no regional  wall motion abnormalities. Left ventricular diastolic parameters are  consistent with Grade I diastolic  dysfunction (impaired relaxation).   2. Right ventricular systolic function is normal. The right ventricular  size is normal.   3. The mitral valve is normal in structure. Trivial mitral valve  regurgitation. No evidence of mitral stenosis.   4. The aortic valve is tricuspid. There is mild calcification of the  aortic valve. Aortic valve regurgitation is not visualized. No aortic  stenosis is present.   5. The inferior vena cava is normal in size with greater than 50%  respiratory variability, suggesting right atrial pressure of 3 mmHg.   Cardiac catheterization  07/01/2021 Patent proximal and mid LAD stents with minimal restenosis.  Known occlusion of the previously stented second obtuse marginal branch The RCA is a large dominant artery with moderate, calcified mid stenosis. No change from last cath. No obstructive lesions are seen. Diagnostic Dominance: Co-dominant   Lexiscan  Myoview  05/26/2021 Nuclear stress  EF: 45%. The left ventricular ejection fraction is mildly decreased (45-54%). There was no ST segment deviation noted during stress. TID is 1.26, cannot exclude balanced ischemia Defect 1: There is a medium defect of moderate severity present in the mid inferior, mid inferolateral and apical lateral location. Findings consistent with prior myocardial infarction with peri-infarct ischemia. This is an intermediate risk study.   Intermediate risk study. There is an area in the mid to distal inferolateral wall that worsens with stress, consistent with infarct with peri-infarct ischemia. There is also mild wall motion abnormalities seen in this area with overall preserved EF.  Cardiac catheterization 10/14/2019 Prox LAD lesion is 25% stenosed. Previously placed Mid LAD stent (unknown type) is widely patent. 2nd Mrg lesion is 100% stenosed. Prox RCA lesion is 50% stenosed. The left ventricular systolic function is normal. LV end diastolic pressure is normal. The left ventricular ejection fraction is 55-65% by visual estimate. There is no mitral valve regurgitation. Diagnostic Dominance: Co-dominant    1. Single vessel occlusive CAD    - stents in the proximal and mid LAD are widely patent.    - Occluded second OM at site of prior stent. Some left to left collaterals.     - segmental 50% proximal to mid RCA disease. Unchanged from 2013. 2. Normal LV function 3. Normal LVEDP  Exercise tolerance test 10/08/2019 Blood pressure demonstrated a normal response to exercise. Horizontal ST segment depression ST segment depression was noted during stress in the II, I, III, aVF, V3, V4, V5 and V6 leads. At peak exercise, there is significant horizontal ST depression as noted. There is also ST elevation of 3 mm in aVR and 1 mm in V1/aVL. Positive adequate stress test. Ordering provider, Dr. Lavona, made aware of study as he was doctor of the day in the office. Frequent PVCs also seen during peak  exercise.   Risk Assessment/Calculations:             Physical Exam:   VS:  BP 120/70 (BP Location: Right Arm, Patient Position: Sitting, Cuff Size: Normal)   Pulse 90   Ht 5' 11 (1.803 m)   Wt 248 lb 9.6 oz (112.8 kg)   SpO2 98%   BMI 34.67 kg/m    Wt Readings from Last 3 Encounters:  08/14/24 248 lb 9.6 oz (112.8 kg)  07/14/24 235 lb 6.4 oz (106.8 kg)  06/20/24 248 lb (112.5 kg)    GEN: Well nourished, well developed in no acute distress NECK: No JVD; No carotid bruits CARDIAC: RRR, no murmurs, rubs, gallops RESPIRATORY:  Clear to auscultation without rales, wheezing or rhonchi  ABDOMEN: Soft, non-tender, non-distended EXTREMITIES:  No edema; No acute deformity      Assessment and Plan:  Coronary artery disease S/p multivessel PCI Most recent cath 04/2023 with patent proximal and mid LAD stents with minimal restenosis, known occlusion of previously stented second twos marginal branch, RCA with moderate calcified mid stenosis.  Medical management was recommended - Today patient is stable without chest pains.  He exercises daily without exertional symptoms.  No symptoms to suggest active angina, no indication further ischemic evaluation at this time - Continue aspirin  81 mg daily,  isosorbide  60 mg daily, losartan  25 mg daily, rosuvastatin  5 mg daily, and Repatha  140 mg q. 14 days  Hypertension Blood pressure is well-controlled at 120/70 - Continue isosorbide  60 mg daily and losartan  25 mg daily  Hyperlipidemia LDL 39 and well-controlled - Continue rosuvastatin  5 mg daily and Repatha  140 mg q. 14 days  Lower extremity edema His lower extremity edema has entirely resolved His dyspnea entirely resolved since starting on Dupixent  and he is no longer on steroid therapy - He appears euvolemic and well compensated on exam - Home scale shows weight of 257 LBS on 9/15 improved to 243 LBS on 10/22 - Will lower his torsemide  dosing from 40 to 20 mg daily and potassium  supplementation from 60 mEq to 40 mEq - BMET x 1 week - Pending echocardiogram on 08/20/2024      Dispo:  Return in about 6 weeks (around 09/25/2024).  Signed, Lum LITTIE Louis, NP

## 2024-08-14 NOTE — Patient Instructions (Addendum)
 Medication Instructions:  DECREASE TORSEMIDE  TO 20 MG (1 TABLET) DAILY.  DECREASE POTASSIUM CHLORIDE  TO 40 MG (2 TABLETS) DAILY.  Lab Work: BMET TO BE DONE IN 1 WEEK.  Testing/Procedures: KEEP ECHO APPOINTMENT  Follow-Up: At [facility], you and your health needs are our priority.  As part of our continuing mission to provide you with exceptional heart care, our providers are all part of one team.  This team includes your primary Cardiologist (physician) and Advanced Practice Providers or APPs (Physician Assistants and Nurse Practitioners) who all work together to provide you with the care you need, when you need it.  Your next appointment:   KEEP October 01, 2024 APPOINTMENT AT 9:40 AM  Provider:   Lynwood Schilling, MD

## 2024-08-20 ENCOUNTER — Ambulatory Visit (HOSPITAL_COMMUNITY)
Admission: RE | Admit: 2024-08-20 | Discharge: 2024-08-20 | Disposition: A | Discharge status: Home / Self Care | Source: Ambulatory Visit | Attending: Cardiology | Admitting: Cardiology

## 2024-08-20 ENCOUNTER — Ambulatory Visit: Payer: Self-pay | Admitting: Gastroenterology

## 2024-08-20 DIAGNOSIS — R601 Generalized edema: Secondary | ICD-10-CM | POA: Diagnosis not present

## 2024-08-20 DIAGNOSIS — I2510 Atherosclerotic heart disease of native coronary artery without angina pectoris: Secondary | ICD-10-CM | POA: Insufficient documentation

## 2024-08-20 LAB — ECHOCARDIOGRAM COMPLETE
Area-P 1/2: 4.06 cm2
S' Lateral: 2.3 cm

## 2024-08-20 MED ORDER — PERFLUTREN LIPID MICROSPHERE
1.0000 mL | INTRAVENOUS | Status: AC | PRN
  Administered 2024-08-20: 2 mL via INTRAVENOUS

## 2024-08-29 DIAGNOSIS — E108 Type 1 diabetes mellitus with unspecified complications: Secondary | ICD-10-CM | POA: Diagnosis not present

## 2024-09-11 ENCOUNTER — Other Ambulatory Visit (HOSPITAL_COMMUNITY): Payer: Self-pay

## 2024-09-11 DIAGNOSIS — H40022 Open angle with borderline findings, high risk, left eye: Secondary | ICD-10-CM | POA: Diagnosis not present

## 2024-09-11 DIAGNOSIS — H401111 Primary open-angle glaucoma, right eye, mild stage: Secondary | ICD-10-CM | POA: Diagnosis not present

## 2024-09-23 ENCOUNTER — Other Ambulatory Visit: Payer: Self-pay

## 2024-09-23 DIAGNOSIS — I10 Essential (primary) hypertension: Secondary | ICD-10-CM | POA: Diagnosis not present

## 2024-09-23 DIAGNOSIS — G4733 Obstructive sleep apnea (adult) (pediatric): Secondary | ICD-10-CM | POA: Diagnosis not present

## 2024-09-23 MED ORDER — DUPIXENT 300 MG/2ML ~~LOC~~ SOAJ
300.0000 mg | SUBCUTANEOUS | 1 refills | Status: DC
  Filled 2024-09-29 (×2): qty 4, 28d supply, fill #0

## 2024-09-23 NOTE — Addendum Note (Signed)
 Addended by: Kaliyah Gladman L on: 09/23/2024 03:36 PM   Modules accepted: Orders

## 2024-09-25 DIAGNOSIS — E103593 Type 1 diabetes mellitus with proliferative diabetic retinopathy without macular edema, bilateral: Secondary | ICD-10-CM | POA: Diagnosis not present

## 2024-09-25 DIAGNOSIS — E66812 Obesity, class 2: Secondary | ICD-10-CM | POA: Diagnosis not present

## 2024-09-25 DIAGNOSIS — G4733 Obstructive sleep apnea (adult) (pediatric): Secondary | ICD-10-CM | POA: Diagnosis not present

## 2024-09-25 DIAGNOSIS — E8881 Metabolic syndrome: Secondary | ICD-10-CM | POA: Diagnosis not present

## 2024-09-29 ENCOUNTER — Other Ambulatory Visit: Payer: Self-pay

## 2024-09-29 NOTE — Progress Notes (Unsigned)
 Cardiology Office Note:   Date:  09/29/2024  ID:  [name], DOB 22-Aug-1966, MRN [id number] PCP: Magdaline Debby HERO, MD  [facility] Providers Cardiologist:  Lynwood Schilling, MD Cardiology APP:  Rana Lum CROME, NP {  History of Present Illness:   [name] is a 58 y.o. male  who presents for followup of his coronary disease. He is status post multivessel PCI.  He did have a stress test in 2017. He had an equivocal stress test had a cath with proximal mid LAD stents with mild in-stent restenosis.  He had known occlusion of a previously stented obtuse marginal.  The RCA was a dominant vessel with mild stenosis.  He was managed medically.  At the last visit with me he had increased lower extremity edema.  He had a well preserved EF.  He improved with Torsemide .  ***      Since he was last seen he has had significant problem with his asthma.  He is been on steroids.  His diabetes has been out of control because of it.  He is at weight gain and some increased swelling.  He was just put on Dupixent  and thinks that this helped in the last couple of days.  Prior to this he was having a lot of coughing and almost syncope with this.  He is finally in the last few days breathing better.  He actually had on his exercise bike.  He has been able to do that in the last day.  Prior to his asthma acting up he said he was doing relatively well.  He was not having any chest discomfort, neck or arm discomfort.  He was having no new shortness of breath.  He was not having any new PND or orthopnea.  He is not having any new palpitations, presyncope or syncope.  ROS: ***  Studies Reviewed:    EKG:       ***  Risk Assessment/Calculations:   {Does this patient have ATRIAL FIBRILLATION?[phone]} No BP recorded.  {Refresh Note OR Click here to enter BP  :1}***   STOP-Bang Score:     { Consider Dx Sleep Disordered Breathing or Sleep Apnea  ICD G47.33          :1}     Physical Exam:   VS:   There were no vitals taken for this visit.   Wt Readings from Last 3 Encounters:  08/14/24 248 lb 9.6 oz (112.8 kg)  07/14/24 235 lb 6.4 oz (106.8 kg)  06/20/24 248 lb (112.5 kg)     GEN: Well nourished, well developed in no acute distress NECK: No JVD; No carotid bruits CARDIAC: ***RR, *** murmurs, rubs, gallops RESPIRATORY:  Clear to auscultation without rales, wheezing or rhonchi  ABDOMEN: Soft, non-tender, non-distended EXTREMITIES:  No edema; No deformity   ASSESSMENT AND PLAN:   CAD :   ***  The patient has no new sypmtoms.  No further cardiovascular testing is indicated.  We will continue with aggressive risk reduction and meds as listed.   Hypertension : Blood pressure is ***  upper limits of normal but this is unusual and he will keep a look at this on the blood pressure diary.  No change in therapy.   Hyperlipidemia : LDL was ***  39 with an HDL of 45.  No change in therapy.    DM : A1c is *** up to 7.9.  He is having this followed by his primary.    Edema:   ***  His lower extremity swelling is increased.  I am going to have him take his Lasix  40 mg daily for the next 3 days.  If he is not responding to this he is gena let me know and I might change his diuretic further.        Follow up ***  Signed, Lynwood Schilling, MD

## 2024-09-30 ENCOUNTER — Other Ambulatory Visit: Payer: Self-pay

## 2024-10-01 ENCOUNTER — Encounter: Payer: Self-pay | Admitting: Cardiology

## 2024-10-01 ENCOUNTER — Ambulatory Visit: Attending: Cardiology | Admitting: Cardiology

## 2024-10-01 ENCOUNTER — Other Ambulatory Visit: Payer: Self-pay | Admitting: Internal Medicine

## 2024-10-01 VITALS — BP 120/70 | HR 99 | Ht 71.0 in | Wt 254.0 lb

## 2024-10-01 DIAGNOSIS — E785 Hyperlipidemia, unspecified: Secondary | ICD-10-CM

## 2024-10-01 DIAGNOSIS — M7989 Other specified soft tissue disorders: Secondary | ICD-10-CM | POA: Diagnosis not present

## 2024-10-01 DIAGNOSIS — I10 Essential (primary) hypertension: Secondary | ICD-10-CM | POA: Diagnosis not present

## 2024-10-01 DIAGNOSIS — E118 Type 2 diabetes mellitus with unspecified complications: Secondary | ICD-10-CM | POA: Diagnosis not present

## 2024-10-01 DIAGNOSIS — I2510 Atherosclerotic heart disease of native coronary artery without angina pectoris: Secondary | ICD-10-CM | POA: Diagnosis not present

## 2024-10-01 DIAGNOSIS — I214 Non-ST elevation (NSTEMI) myocardial infarction: Secondary | ICD-10-CM

## 2024-10-01 NOTE — Patient Instructions (Signed)

## 2024-10-09 ENCOUNTER — Ambulatory Visit: Admitting: Pulmonary Disease

## 2024-10-09 ENCOUNTER — Encounter: Payer: Self-pay | Admitting: Pulmonary Disease

## 2024-10-09 ENCOUNTER — Ambulatory Visit

## 2024-10-09 VITALS — BP 114/69 | HR 108 | Ht 71.0 in | Wt 253.0 lb

## 2024-10-09 DIAGNOSIS — J45909 Unspecified asthma, uncomplicated: Secondary | ICD-10-CM

## 2024-10-09 DIAGNOSIS — J4550 Severe persistent asthma, uncomplicated: Secondary | ICD-10-CM

## 2024-10-09 DIAGNOSIS — G4733 Obstructive sleep apnea (adult) (pediatric): Secondary | ICD-10-CM | POA: Diagnosis not present

## 2024-10-09 LAB — PULMONARY FUNCTION TEST
DL/VA % pred: 118 %
DL/VA: 5 ml/min/mmHg/L
DLCO unc % pred: 104 %
DLCO unc: 29.92 ml/min/mmHg
FEF 25-75 Post: 2.47 L/s
FEF 25-75 Pre: 2.62 L/s
FEF2575-%Change-Post: -5 %
FEF2575-%Pred-Post: 79 %
FEF2575-%Pred-Pre: 83 %
FEV1-%Change-Post: -1 %
FEV1-%Pred-Post: 79 %
FEV1-%Pred-Pre: 80 %
FEV1-Post: 3 L
FEV1-Pre: 3.04 L
FEV1FVC-%Change-Post: -1 %
FEV1FVC-%Pred-Pre: 102 %
FEV6-%Change-Post: 0 %
FEV6-%Pred-Post: 82 %
FEV6-%Pred-Pre: 82 %
FEV6-Post: 3.93 L
FEV6-Pre: 3.91 L
FEV6FVC-%Change-Post: 0 %
FEV6FVC-%Pred-Post: 104 %
FEV6FVC-%Pred-Pre: 104 %
FVC-%Change-Post: 0 %
FVC-%Pred-Post: 79 %
FVC-%Pred-Pre: 78 %
FVC-Post: 3.94 L
FVC-Pre: 3.92 L
Post FEV1/FVC ratio: 76 %
Post FEV6/FVC ratio: 100 %
Pre FEV1/FVC ratio: 78 %
Pre FEV6/FVC Ratio: 100 %
RV % pred: 103 %
RV: 2.35 L
TLC % pred: 108 %
TLC: 7.78 L

## 2024-10-09 NOTE — Patient Instructions (Signed)
 Follow-up in about 6 months  Continue using your Dupixent , continue using your inhalers  Download from your CPAP machine shows it is working well with good control of events  Make adjustments with respect to meals before bedtime, water  intake before bedtime I think this should help your sleep quality and less number of awakenings  Try to make sure you are getting at least 7 hours of sleep  Continue regular exercises  Call us  with significant concerns

## 2024-10-09 NOTE — Patient Instructions (Signed)
 Full pft performed today

## 2024-10-09 NOTE — Progress Notes (Signed)
 [name]    [id number]    [birth year]  Primary Care Physician:Whyte, Debby HERO, MD  Referring Physician: Magdaline Debby HERO, MD [address] [place],  [place] [address]  Chief complaint:   Follow-up for asthma Has been doing really well with use of Dupixent   Discussed the use of AI scribe software for clinical note transcription with the patient, who gave verbal consent to proceed.  History of Present Illness   [name] is a 58 year old male with sleep apnea and respiratory issues who presents with sleep disturbances and respiratory symptoms.  He experiences ongoing sleep disturbances, characterized by difficulty maintaining sleep. He typically goes to bed around 11 PM and wakes up around 2 AM, often due to nocturia. He struggles to return to sleep, resulting in approximately four to five hours of sleep per night. He feels sleepy during the day, particularly after meals, which he attributes to his diabetes. His wife has suggested walking for ten minutes after eating to help with alertness.  He uses a CPAP machine but has not been using it recently due to feeling unwell. Despite this, he reports a CPAP compliance score of two events per hour. He notes that he wakes up frequently at night, which he attributes to drinking water  close to bedtime.  He has respiratory issues, including prior episodes of bronchitis. Recently, he experienced sinus congestion and some wheezing, which was managed with a nebulizer and Pulmicort . He uses Pulmicort  twice daily and Dupixent  biweekly, which he finds beneficial for his respiratory symptoms. His breathing has improved significantly since starting Dupixent , allowing him to exercise more effectively on a treadmill and bike.  He mentions that he has diabetes, which affects his energy levels post-meals. He tries to maintain a regular exercise routine, which he finds helpful in managing his respiratory symptoms.     Recent exposure  to family members with a lower respiratory infection, he did not get real sick  Behavioral modifications regarding his sleep habits was discussed during the visit today  Recent follow-up with cardiology for coronary artery disease, history of multivessel PCI he has a history of diabetes  Outpatient Encounter Medications as of 10/09/2024  Medication Sig   ACCU-CHEK AVIVA PLUS test strip 1 each by Other route as needed.    albuterol  (PROVENTIL  HFA;VENTOLIN  HFA) 108 (90 Base) MCG/ACT inhaler Inhale 1 puff into the lungs every 6 (six) hours as needed for wheezing or shortness of breath.   albuterol  (PROVENTIL ) (2.5 MG/3ML) 0.083% nebulizer solution Take 3 mLs (2.5 mg total) by nebulization every 6 (six) hours as needed for wheezing or shortness of breath.   aspirin  81 MG tablet Take 81 mg by mouth daily.   azelastine (ASTELIN) 0.1 % nasal spray Place 1 spray into the nose daily.   benzonatate  (TESSALON ) 100 MG capsule Take 1 capsule (100 mg total) by mouth 3 (three) times daily as needed for cough.   budesonide  (PULMICORT ) 0.5 MG/2ML nebulizer solution Take 2 mLs (0.5 mg total) by nebulization 2 (two) times daily.   Coenzyme Q10 100 MG capsule Take by mouth.   Continuous Blood Gluc Sensor (DEXCOM G6 SENSOR) MISC    Dupilumab  (DUPIXENT ) 300 MG/2ML SOAJ Inject 300 mg into the skin every 14 (fourteen) days. **loading dose received in clinic on 05/20/2024**   Evolocumab  (REPATHA ) 140 MG/ML SOSY INJECT 140 MG EVERY 14 DAYS   fluticasone  (FLONASE ) 50 MCG/ACT nasal spray SHAKE LIQUID AND USE  1 SPRAY IN EACH NOSTRIL DAILY   Fluticasone -Umeclidin-Vilant (TRELEGY ELLIPTA ) 200-62.5-25 MCG/ACT AEPB Inhale 1 puff into the lungs daily.   insulin  aspart (NOVOLOG  FLEXPEN) 100 UNIT/ML FlexPen To be inject 3-4 times daily for meals or snacks at directed, up to 100 units daily.   isosorbide  mononitrate (IMDUR ) 60 MG 24 hr tablet TAKE 1 TABLET(60 MG) BY MOUTH DAILY   losartan  (COZAAR ) 25 MG tablet TAKE 1 TABLET(25 MG)  BY MOUTH DAILY   metolazone  (ZAROXOLYN ) 2.5 MG tablet Take 1 tablet (2.5 mg total) by mouth daily. Take 2.5 mg 1/2 hour prior to taking Torsemide . Save other pills for future use if directed.   montelukast  (SINGULAIR ) 10 MG tablet TAKE 1 TABLET(10 MG) BY MOUTH AT BEDTIME   nitroGLYCERIN  (NITROSTAT ) 0.4 MG SL tablet DISSOLVE 1 TABLET UNDER TONGUE EVERY 5 MINUTES AS NEEDED FOR CHEST PAIN AS DIRECTED   potassium chloride  SA (KLOR-CON  M) 20 MEQ tablet Take 2 tablets (40 mEq total) by mouth daily.   rosuvastatin  (CRESTOR ) 5 MG tablet TAKE 1 TABLET(5 MG) BY MOUTH DAILY AT 6 PM   torsemide  (DEMADEX ) 20 MG tablet Take 1 tablet (20 mg total) by mouth daily.   No facility-administered encounter medications on file as of 10/09/2024.    Allergies as of 10/09/2024   (No Known Allergies)    Past Medical History:  Diagnosis Date   Asthma    Coronary artery disease    s/p NSTEMI after ETT-echo - [facility] 7/13:  LM < 10%, pLAD 90%, mLAD 95%, pCFX tandem 90%, mCFX 80-90%, pRCA 30%, mRCA 40-50%;  PCI: Xience DES x 2 to prox and mid LAD; Xience DES x 1 to prox and mid CFX   Family history of anesthesia complication    nausea    GERD (gastroesophageal reflux disease)    HLD (hyperlipidemia)    Hypertension    Lung nodule 2013   CT 7/13: 3 mm RML nodule - felt to be stable and benign/ follow up in 2014   Myocardial infarction [facility]) 2013   have 4 stents   S/P coronary artery stent placement    Sleep apnea    Type I diabetes mellitus (HCC)    insulin  dependent    Past Surgical History:  Procedure Laterality Date   25 GAUGE PARS PLANA VITRECTOMY WITH 20 GAUGE MVR PORT FOR MACULAR HOLE Right 08/01/2016   25 GAUGE PARS PLANA VITRECTOMY WITH 20 GAUGE MVR PORT FOR MACULAR HOLE, MEMBRANE PEEL AND SERUM PATCH WITH ENDOLASER AND C3F8    25 GAUGE PARS PLANA VITRECTOMY WITH 20 GAUGE MVR PORT FOR MACULAR HOLE Right 08/01/2016   Procedure: 25 GAUGE PARS PLANA VITRECTOMY WITH 20 GAUGE MVR PORT FOR MACULAR HOLE,  MEMBRANE PEEL AND SERUM PATCH WITH ENDOLASER AND C3F8;  Surgeon: Norleen JONETTA Ku, MD;  Location: [facility] OR;  Service: Ophthalmology;  Laterality: Right;   APPENDECTOMY  1980's   CATARACT EXTRACTION  07/2017   right eye   CHOLECYSTECTOMY  ~ 2006   COLONOSCOPY     CORONARY ANGIOPLASTY WITH STENT PLACEMENT  04/2013   LAD   GAS/FLUID EXCHANGE Right 09/16/2013   Procedure: AIR/FLUID EXCHANGE;  Surgeon: Norleen JONETTA Ku, MD;  Location: [facility] OR;  Service: Ophthalmology;  Laterality: Right;   GAS/FLUID EXCHANGE Right 08/01/2016   Procedure: GAS/FLUID EXCHANGE;  Surgeon: Norleen JONETTA Ku, MD;  Location: [facility] OR;  Service: Ophthalmology;  Laterality: Right;   KNEE ARTHROSCOPY Right ~ 1984   LEFT HEART CATH AND CORONARY ANGIOGRAPHY N/A 10/14/2019  Procedure: LEFT HEART CATH AND CORONARY ANGIOGRAPHY;  Surgeon: Jordan, Peter M, MD;  Location: [facility] INVASIVE CV LAB;  Service: Cardiovascular;  Laterality: N/A;   LEFT HEART CATH AND CORONARY ANGIOGRAPHY N/A 07/01/2021   Procedure: LEFT HEART CATH AND CORONARY ANGIOGRAPHY;  Surgeon: Verlin Lonni BIRCH, MD;  Location: [facility] INVASIVE CV LAB;  Service: Cardiovascular;  Laterality: N/A;   LEFT HEART CATHETERIZATION WITH CORONARY ANGIOGRAM N/A 05/03/2012   Procedure: LEFT HEART CATHETERIZATION WITH CORONARY ANGIOGRAM;  Surgeon: Peter M Jordan, MD;  Location: [facility] CATH LAB;  Service: Cardiovascular;  Laterality: N/A;   MEMBRANE PEEL Right 09/16/2013   Procedure: MEMBRANE PEEL;  Surgeon: Norleen BIRCH Ku, MD;  Location: [facility] OR;  Service: Ophthalmology;  Laterality: Right;   PARS PLANA VITRECTOMY Right 09/16/2013   w/membrane peel/notes 11/25/20145   PARS PLANA VITRECTOMY Right 09/16/2013   Procedure: PARS PLANA VITRECTOMY WITH 25 GAUGE;  Surgeon: Norleen BIRCH Ku, MD;  Location: [facility] OR;  Service: Ophthalmology;  Laterality: Right;   PHOTOCOAGULATION WITH LASER Right 09/16/2013   Procedure: PHOTOCOAGULATION WITH LASER;  Surgeon: Norleen BIRCH Ku, MD;  Location: [facility] OR;  Service:  Ophthalmology;  Laterality: Right;  ENDOLASER   POLYPECTOMY     SERUM PATCH Right 08/01/2016   Procedure: SERUM PATCH;  Surgeon: Norleen BIRCH Ku, MD;  Location: [facility] OR;  Service: Ophthalmology;  Laterality: Right;   TONSILLECTOMY AND ADENOIDECTOMY  1970's    Family History  Problem Relation Age of Onset   Retinitis pigmentosa Father    Coronary artery disease Brother 56       Died   Heart attack Brother    Heart disease Brother    Hypertension Mother    Coronary artery disease Maternal Uncle 58       Died   Coronary artery disease Maternal Grandmother 96       Died   Aneurysm Maternal Grandfather 59       Abdominal   Aneurysm Daughter 16       Cerebral   Colon polyps Neg Hx    Colon cancer Neg Hx    Esophageal cancer Neg Hx    Stomach cancer Neg Hx    Rectal cancer Neg Hx     Social History   Socioeconomic History   Marital status: Married    Spouse name: Not on file   Number of children: 1   Years of education: Not on file   Highest education level: Not on file  Occupational History   Occupation: Furniture buisiness  Tobacco Use   Smoking status: Never    Passive exposure: Past   Smokeless tobacco: Never  Vaping Use   Vaping status: Never Used  Substance and Sexual Activity   Alcohol use: Yes    Comment: rare   Drug use: No   Sexual activity: Yes  Other Topics Concern   Not on file  Social History Narrative   Lives with wife and 3 step children.   Social Drivers of Health   Tobacco Use: Low Risk (10/09/2024)   Patient History    Smoking Tobacco Use: Never    Smokeless Tobacco Use: Never    Passive Exposure: Past  Financial Resource Strain: Low Risk (06/08/2024)   Received from [facility]   Overall Financial Resource Strain (CARDIA)    How hard is it for you to pay for the very basics like food, housing, medical care, and heating?: Not hard at all  Food Insecurity: No Food Insecurity (06/08/2024)   Received from [facility]   Epic    Within the  past 12 months, you worried that your food would run out before you got the money to buy more.: Never true    Within the past 12 months, the food you bought just didn't last and you didn't have money to get more.: Never true  Transportation Needs: No Transportation Needs (06/08/2024)   Received from [facility]    In the past 12 months, has lack of transportation kept you from medical appointments or from getting medications?: No    In the past 12 months, has lack of transportation kept you from meetings, work, or from getting things needed for daily living?: No  Physical Activity: Insufficiently Active (06/08/2024)   Received from [facility]   Exercise Vital Sign    On average, how many days per week do you engage in moderate to strenuous exercise (like a brisk walk)?: 3 days    On average, how many minutes do you engage in exercise at this level?: 30 min  Stress: No Stress Concern Present (06/08/2024)   Received from [facility] - Occupational Stress Questionnaire    Do you feel stress - tense, restless, nervous, or anxious, or unable to sleep at night because your mind is troubled all the time - these days?: Not at all  Social Connections: Socially Integrated (06/08/2024)   Received from [facility]   Social Network    How would you rate your social network (family, work, friends)?: Good participation with social networks  Intimate Partner Violence: Not At Risk (06/08/2024)   Received from [facility]   HITS    Over the last 12 months how often did your partner scream or curse at you?: Never    Over the last 12 months how often did your partner physically hurt you?: Never    Over the last 12 months how often did your partner insult you or talk down to you?: Never    Over the last 12 months how often did your partner threaten you with physical harm?: Never  Depression (PHQ2-9): Not on file  Alcohol Screen: Not on file  Housing:  Low Risk (06/08/2024)   Received from [facility]    In the last 12 months, was there a time when you were not able to pay the mortgage or rent on time?: No    In the past 12 months, how many times have you moved where you were living?: 0    At any time in the past 12 months, were you homeless or living in a shelter (including now)?: No  Utilities: Not At Risk (06/08/2024)   Received from [facility]    In the past 12 months has the electric, gas, oil, or water  company threatened to shut off services in your home?: No  Health Literacy: Not on file    Review of Systems  Respiratory:  Positive for apnea. Negative for cough and shortness of breath.   Psychiatric/Behavioral:  Positive for sleep disturbance.     Vitals:   10/09/24 1323  BP: 114/69  Pulse: (!) 108  SpO2: 95%     Physical Exam Constitutional:      Appearance: He is obese.  HENT:     Head: Normocephalic.     Nose: Nose normal.     Mouth/Throat:     Mouth: Mucous membranes are moist.  Eyes:  General: No scleral icterus. Cardiovascular:     Rate and Rhythm: Normal rate and regular rhythm.     Heart sounds: No murmur heard.    No friction rub.  Pulmonary:     Effort: No respiratory distress.     Breath sounds: No stridor. No wheezing or rhonchi.  Musculoskeletal:     Cervical back: No rigidity or tenderness.  Skin:    General: Skin is warm.  Neurological:     General: No focal deficit present.     Mental Status: He is alert.  Psychiatric:        Mood and Affect: Mood normal.     Data Reviewed: Download from CPAP shows compliance of 68% AutoSet 5-10 95 percentile pressure of 8.5 Residual AHI of 2  Assessment and Plan    Obstructive sleep apnea Managed with CPAP therapy. Recent non-compliance due to feeling unwell. CPAP compliance indicates effective treatment with only two events per hour, below the threshold of concern. Sleep disturbances include waking up at night to urinate,  potentially related to fluid intake close to bedtime. - Encouraged earlier meal times and reduced fluid intake close to bedtime to minimize nocturia. - Will consider sleep aid if sleep disturbances persist despite lifestyle modifications.  Asthma Recent exacerbation characterized by wheezing and cough, particularly at night. Symptoms improved with nebulizer use and Dupixent . Pulmicort  prescribed for twice-daily use to deliver steroids directly to the airways. Recent pulmonary function tests show normal results, indicating effective management of asthma symptoms. - Ensure twice-daily use of Pulmicort . - Continue Dupixent  as prescribed.      Continue using CPAP nightly  Continue bronchodilator treatments  Use Pulmicort  for any wheezing  Follow-up in about 6 months  Call us  with significant concerns   No orders of the defined types were placed in this encounter.     Jennet Epley MD [facility] 10/09/2024, 2:14 PM  CC: Magdaline Debby HERO, MD

## 2024-10-09 NOTE — Progress Notes (Signed)
 Full pft performed today

## 2024-10-17 ENCOUNTER — Other Ambulatory Visit: Payer: Self-pay | Admitting: Adult Health

## 2024-10-17 DIAGNOSIS — J4550 Severe persistent asthma, uncomplicated: Secondary | ICD-10-CM

## 2024-10-20 ENCOUNTER — Other Ambulatory Visit: Payer: Self-pay

## 2024-10-20 NOTE — Telephone Encounter (Signed)
 Refill sent for DUPIXENT  to [facility]: [phone]  Dose: 300mg  [place] every 14 days   Last OV: 10/09/24 Provider: Dr. Neda  Next OV: June 2026  Aleck Puls, PharmD, BCPS Clinical Pharmacist  [facility]

## 2024-10-21 ENCOUNTER — Other Ambulatory Visit: Payer: Self-pay

## 2024-10-22 ENCOUNTER — Other Ambulatory Visit: Payer: Self-pay

## 2024-10-22 ENCOUNTER — Other Ambulatory Visit (HOSPITAL_COMMUNITY): Payer: Self-pay

## 2024-10-22 DIAGNOSIS — J4540 Moderate persistent asthma, uncomplicated: Secondary | ICD-10-CM

## 2024-10-22 NOTE — Progress Notes (Signed)
 ATC patient to discuss Dupixent  - able to fill with [facility]. LVM with my office line to return call.

## 2024-10-29 ENCOUNTER — Other Ambulatory Visit: Payer: Self-pay

## 2024-10-30 NOTE — Progress Notes (Signed)
 Second ATC patient regarding Dupixent . LVM with my direct line to return call.

## 2024-10-31 ENCOUNTER — Other Ambulatory Visit: Payer: Self-pay

## 2024-10-31 ENCOUNTER — Ambulatory Visit: Attending: Pulmonary Disease

## 2024-10-31 DIAGNOSIS — J4540 Moderate persistent asthma, uncomplicated: Secondary | ICD-10-CM

## 2024-10-31 DIAGNOSIS — J4550 Severe persistent asthma, uncomplicated: Secondary | ICD-10-CM

## 2024-10-31 MED ORDER — DUPIXENT 300 MG/2ML ~~LOC~~ SOAJ
300.0000 mg | SUBCUTANEOUS | 1 refills | Status: AC
  Filled 2024-11-04: qty 4, 28d supply, fill #0
  Filled 2024-11-26: qty 4, 28d supply, fill #1

## 2024-10-31 NOTE — Addendum Note (Signed)
 Addended by: Lynleigh Kovack L on: 10/31/2024 11:35 AM   Modules accepted: Orders

## 2024-10-31 NOTE — Progress Notes (Signed)
 [facility]  Referring Provider: Dr. Neda  Virtual Visit via Telephone Note  I connected with [name] on 10/31/2024 at 12:00 PM EST by telephone and verified that I am speaking with the correct person using two identifiers.  Location: Patient: home Provider: office   I discussed the limitations, risks, security and privacy concerns of performing an evaluation and management service by telephone and the availability of in person appointments. I also discussed with the patient that there may be a patient responsible charge related to this service. The patient expressed understanding and agreed to proceed.   HPI: AARION Mccoy is a 59 y.o. male who presents to the pharmacotherapy clinic via telephone for follow-up Dupixent  counseling. Previously obtained Dupixent  through [facility], but will be obtaining Dupixent  through [facility] going forward. Contacting patient to discuss process of obtaining Dupixent .   Patient is taking Dupixent  for severe persistent asthma.   Doses remaining: has one pen remaining (300mg /36mL [place] autoinjector) Next dose due: 11/04/24  Patient Active Problem List   Diagnosis Date Noted   Leg swelling 09/30/2023   Allergic asthma, moderate persistent, uncomplicated 03/21/2023   OSA on CPAP 03/21/2023   Chronic bronchitis with acute exacerbation (HCC) 03/09/2023   Abnormal stress test 10/14/2019   Dyslipidemia 09/24/2019   Type I diabetes mellitus (HCC)    Hypothyroidism    HLD (hyperlipidemia)    Family history of anesthesia complication    Dyspnea    Coronary artery disease    Macular hole, right 08/01/2016   Preretinal fibrosis, 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   Proliferative retinopathy due to DM (HCC) 09/16/2013   Leg pain, bilateral 05/31/2012   Coronary Artery Disease 05/20/2012   Lung nodule 05/04/2012   Hyperlipidemia 05/03/2012   NSTEMI (non-ST elevated myocardial infarction) (HCC) 05/03/2012    Abnormal EKG 05/02/2012   IDDM (insulin  dependent diabetes mellitus) 05/02/2012   Overweight 05/02/2012   Hypertension    Vitreous hemorrhage (HCC) 03/28/2012   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    ALBUTEROL  (PROVENTIL  HFA;VENTOLIN  HFA) 108 (90 BASE) MCG/ACT INHALER    Inhale 1 puff into the lungs every 6 (six) hours as needed for wheezing or shortness of breath.   ALBUTEROL  (PROVENTIL ) (2.5 MG/3ML) 0.083% NEBULIZER SOLUTION    Take 3 mLs (2.5 mg total) by nebulization every 6 (six) hours as needed for wheezing or shortness of breath.   ASPIRIN  81 MG TABLET    Take 81 mg by mouth daily.   AZELASTINE (ASTELIN) 0.1 % NASAL SPRAY    Place 1 spray into the nose daily.   BENZONATATE  (TESSALON ) 100 MG CAPSULE    Take 1 capsule (100 mg total) by mouth 3 (three) times daily as needed for cough.   BUDESONIDE  (PULMICORT ) 0.5 MG/2ML NEBULIZER SOLUTION    Take 2 mLs (0.5 mg total) by nebulization 2 (two) times daily.   COENZYME Q10 100 MG CAPSULE    Take by mouth.   CONTINUOUS BLOOD GLUC SENSOR (DEXCOM G6 SENSOR) MISC       DUPILUMAB  (DUPIXENT ) 300 MG/2ML SOAJ    Inject 300 mg into the skin every 14 (fourteen) days. **loading dose received in clinic on 05/20/2024**   DUPILUMAB  (DUPIXENT ) 300 MG/2ML SOAJ    INJECT 300 MG  UNDER THE SKIN EVERY 14 DAYS   EVOLOCUMAB  (REPATHA ) 140 MG/ML SOSY    INJECT 140 MG EVERY 14 DAYS   FLUTICASONE  (FLONASE ) 50 MCG/ACT NASAL SPRAY    SHAKE LIQUID AND USE 1 SPRAY IN EACH NOSTRIL DAILY   FLUTICASONE -UMECLIDIN-VILANT (TRELEGY ELLIPTA ) 200-62.5-25 MCG/ACT AEPB    Inhale 1 puff into the lungs daily.   INSULIN  ASPART (NOVOLOG  FLEXPEN) 100 UNIT/ML FLEXPEN    To be inject 3-4 times daily for meals or snacks at directed, up to 100 units daily.   ISOSORBIDE   MONONITRATE (IMDUR ) 60 MG 24 HR TABLET    TAKE 1 TABLET(60 MG) BY MOUTH DAILY   LOSARTAN  (COZAAR ) 25 MG TABLET    TAKE 1 TABLET(25 MG) BY MOUTH DAILY   METOLAZONE  (ZAROXOLYN ) 2.5 MG TABLET    Take 1 tablet (2.5 mg total) by mouth daily. Take 2.5 mg 1/2 hour prior to taking Torsemide . Save other pills for future use if directed.   MONTELUKAST  (SINGULAIR ) 10 MG TABLET    TAKE 1 TABLET(10 MG) BY MOUTH AT BEDTIME   NITROGLYCERIN  (NITROSTAT ) 0.4 MG SL TABLET    DISSOLVE 1 TABLET UNDER TONGUE EVERY 5 MINUTES AS NEEDED FOR CHEST PAIN AS DIRECTED   POTASSIUM CHLORIDE  SA (KLOR-CON  M) 20 MEQ TABLET    Take 2 tablets (40 mEq total) by mouth daily.   ROSUVASTATIN  (CRESTOR ) 5 MG TABLET    TAKE 1 TABLET(5 MG) BY MOUTH DAILY AT 6 PM   TORSEMIDE  (DEMADEX ) 20 MG TABLET    Take 1 tablet (20 mg total) by mouth daily.  Modified Medications   No medications on file  Discontinued Medications   No medications on file    Allergies: Allergies[1]  Past Medical History: Past Medical History:  Diagnosis Date   Asthma    Coronary artery disease    s/p NSTEMI after ETT-echo - [facility] 7/13:  LM < 10%, pLAD 90%, mLAD 95%, pCFX tandem 90%, mCFX 80-90%, pRCA 30%, mRCA 40-50%;  PCI: Xience DES x 2 to prox and mid LAD; Xience DES x 1 to prox and mid CFX   Family history of anesthesia complication    nausea    GERD (gastroesophageal reflux disease)    HLD (hyperlipidemia)    Hypertension    Lung nodule 2013   CT 7/13: 3 mm RML nodule - felt to be stable and benign/ follow up in 2014   Myocardial infarction [facility]) 2013   have 4 stents   S/P coronary artery stent placement    Sleep apnea    Type I diabetes mellitus (HCC)    insulin  dependent    Social History: Social History   Socioeconomic History   Marital status: Married    Spouse name: Not on file   Number of children: 1   Years of education: Not on file   Highest education level: Not on file  Occupational History   Occupation: Furniture buisiness  Tobacco  Use   Smoking status: Never    Passive exposure: Past   Smokeless tobacco: Never  Vaping Use   Vaping status: Never Used  Substance and Sexual Activity   Alcohol use: Yes    Comment: rare   Drug use: No   Sexual activity: Yes  Other Topics Concern   Not on file  Social History Narrative   Lives with wife and 3 step children.   Social Drivers of Health   Tobacco Use: Low Risk (10/09/2024)   Patient History    Smoking Tobacco Use: Never  Smokeless Tobacco Use: Never    Passive Exposure: Past  Financial Resource Strain: Low Risk (06/08/2024)   Received from [facility]   Overall Financial Resource Strain (CARDIA)    How hard is it for you to pay for the very basics like food, housing, medical care, and heating?: Not hard at all  Food Insecurity: No Food Insecurity (06/08/2024)   Received from [facility]   Epic    Within the past 12 months, you worried that your food would run out before you got the money to buy more.: Never true    Within the past 12 months, the food you bought just didn't last and you didn't have money to get more.: Never true  Transportation Needs: No Transportation Needs (06/08/2024)   Received from [facility]    In the past 12 months, has lack of transportation kept you from medical appointments or from getting medications?: No    In the past 12 months, has lack of transportation kept you from meetings, work, or from getting things needed for daily living?: No  Physical Activity: Insufficiently Active (06/08/2024)   Received from [facility]   Exercise Vital Sign    On average, how many days per week do you engage in moderate to strenuous exercise (like a brisk walk)?: 3 days    On average, how many minutes do you engage in exercise at this level?: 30 min  Stress: No Stress Concern Present (06/08/2024)   Received from [facility] - Occupational Stress Questionnaire    Do you feel stress - tense,  restless, nervous, or anxious, or unable to sleep at night because your mind is troubled all the time - these days?: Not at all  Social Connections: Socially Integrated (06/08/2024)   Received from [facility]   Social Network    How would you rate your social network (family, work, friends)?: Good participation with social networks  Depression (PHQ2-9): Not on file  Alcohol Screen: Not on file  Housing: Low Risk (06/08/2024)   Received from [facility]    In the last 12 months, was there a time when you were not able to pay the mortgage or rent on time?: No    In the past 12 months, how many times have you moved where you were living?: 0    At any time in the past 12 months, were you homeless or living in a shelter (including now)?: No  Utilities: Not At Risk (06/08/2024)   Received from [facility]    In the past 12 months has the electric, gas, oil, or water  company threatened to shut off services in your home?: No  Health Literacy: Not on file    Medication: Dupixent  300mg /11mL [place] autoinjector   Counseled patient on purpose, proper use, and adverse effects of Dupixent  including injection site reactions and conjunctivitis.    He denies side effects or concerns with Dupixent .   Plan: - CONTINUE Dupixent  300mg  [place] every 14 days - Rx triaged to [facility]  I discussed the assessment and treatment plan with the patient. The patient was provided an opportunity to ask questions and all were answered. The patient agreed with the plan and demonstrated an understanding of the instructions.   The patient was advised to call back or seek an in-person evaluation if the symptoms worsen or if the condition fails to improve as anticipated.  I  provided 10 minutes of non-face-to-face time during this encounter.  Aleck Puls, PharmD, BCPS, CPP Clinical Pharmacist  [facility]  [facility]    [1] No Known  Allergies

## 2024-10-31 NOTE — Progress Notes (Signed)
 Patient returned my call - completed onboarding counseling for Dupixent  as he will be filling with [facility] in 2026. He denies questions/concerns. His next dose is due on Tuesday, 1/13 and he confirms he has one pen remaining.   We will need to contact him to make shipment for dose due on Tuesday, 11/18/24.  He understands to look out for onboarding call with [facility].   Patient verbalizes understanding and agreement with plan.

## 2024-11-03 ENCOUNTER — Other Ambulatory Visit (HOSPITAL_COMMUNITY): Payer: Self-pay

## 2024-11-03 ENCOUNTER — Other Ambulatory Visit: Payer: Self-pay

## 2024-11-04 ENCOUNTER — Other Ambulatory Visit: Payer: Self-pay

## 2024-11-04 ENCOUNTER — Other Ambulatory Visit (HOSPITAL_COMMUNITY): Payer: Self-pay

## 2024-11-04 NOTE — Progress Notes (Unsigned)
 Specialty Pharmacy Initial Fill Coordination Note  [name] is a 59 y.o. male contacted today regarding initial fill of specialty medication(s) Dupilumab  (Dupixent )   Patient requested Delivery   Delivery date: 11/06/24   Verified address: [address] [place], [place] [address]   Medication will be filled on: 11/05/24   Patient is aware of $0 copayment.

## 2024-11-05 ENCOUNTER — Other Ambulatory Visit: Payer: Self-pay

## 2024-11-10 ENCOUNTER — Encounter (INDEPENDENT_AMBULATORY_CARE_PROVIDER_SITE_OTHER): Admitting: Ophthalmology

## 2024-11-17 ENCOUNTER — Other Ambulatory Visit: Payer: Self-pay | Admitting: Cardiology

## 2024-11-17 ENCOUNTER — Other Ambulatory Visit: Payer: Self-pay

## 2024-11-18 ENCOUNTER — Other Ambulatory Visit (HOSPITAL_COMMUNITY): Payer: Self-pay

## 2024-11-18 ENCOUNTER — Encounter (INDEPENDENT_AMBULATORY_CARE_PROVIDER_SITE_OTHER): Admitting: Ophthalmology

## 2024-11-18 DIAGNOSIS — H35373 Puckering of macula, bilateral: Secondary | ICD-10-CM | POA: Diagnosis not present

## 2024-11-18 DIAGNOSIS — I10 Essential (primary) hypertension: Secondary | ICD-10-CM | POA: Diagnosis not present

## 2024-11-18 DIAGNOSIS — E113593 Type 2 diabetes mellitus with proliferative diabetic retinopathy without macular edema, bilateral: Secondary | ICD-10-CM | POA: Diagnosis not present

## 2024-11-18 DIAGNOSIS — Z794 Long term (current) use of insulin: Secondary | ICD-10-CM

## 2024-11-18 DIAGNOSIS — H35033 Hypertensive retinopathy, bilateral: Secondary | ICD-10-CM | POA: Diagnosis not present

## 2024-11-19 ENCOUNTER — Encounter (INDEPENDENT_AMBULATORY_CARE_PROVIDER_SITE_OTHER): Admitting: Ophthalmology

## 2024-11-20 ENCOUNTER — Other Ambulatory Visit: Payer: Self-pay

## 2024-11-26 ENCOUNTER — Other Ambulatory Visit: Payer: Self-pay

## 2025-04-21 ENCOUNTER — Ambulatory Visit: Admitting: Pulmonary Disease

## 2025-05-14 ENCOUNTER — Encounter (INDEPENDENT_AMBULATORY_CARE_PROVIDER_SITE_OTHER): Admitting: Ophthalmology
